# Patient Record
Sex: Female | Born: 1985 | Race: White | Hispanic: No | Marital: Married | Smoking: Never smoker
Health system: Southern US, Community
[De-identification: ages and names within clinical notes are randomized; demographics above are authoritative.]

## PROBLEM LIST (undated history)

## (undated) ENCOUNTER — Inpatient Hospital Stay: Payer: Self-pay

## (undated) DIAGNOSIS — D649 Anemia, unspecified: Secondary | ICD-10-CM

## (undated) DIAGNOSIS — E282 Polycystic ovarian syndrome: Secondary | ICD-10-CM

## (undated) DIAGNOSIS — O24419 Gestational diabetes mellitus in pregnancy, unspecified control: Secondary | ICD-10-CM

## (undated) DIAGNOSIS — J45909 Unspecified asthma, uncomplicated: Secondary | ICD-10-CM

## (undated) DIAGNOSIS — R000 Tachycardia, unspecified: Secondary | ICD-10-CM

## (undated) DIAGNOSIS — T7840XA Allergy, unspecified, initial encounter: Secondary | ICD-10-CM

## (undated) DIAGNOSIS — O1490 Unspecified pre-eclampsia, unspecified trimester: Secondary | ICD-10-CM

## (undated) DIAGNOSIS — K219 Gastro-esophageal reflux disease without esophagitis: Secondary | ICD-10-CM

## (undated) DIAGNOSIS — Z87442 Personal history of urinary calculi: Secondary | ICD-10-CM

## (undated) DIAGNOSIS — H52209 Unspecified astigmatism, unspecified eye: Secondary | ICD-10-CM

## (undated) DIAGNOSIS — G43909 Migraine, unspecified, not intractable, without status migrainosus: Secondary | ICD-10-CM

## (undated) DIAGNOSIS — O365990 Maternal care for other known or suspected poor fetal growth, unspecified trimester, not applicable or unspecified: Secondary | ICD-10-CM

## (undated) HISTORY — PX: WISDOM TOOTH EXTRACTION: SHX21

## (undated) HISTORY — DX: Polycystic ovarian syndrome: E28.2

## (undated) HISTORY — DX: Allergy, unspecified, initial encounter: T78.40XA

## (undated) HISTORY — DX: Unspecified asthma, uncomplicated: J45.909

## (undated) HISTORY — DX: Migraine, unspecified, not intractable, without status migrainosus: G43.909

## (undated) HISTORY — PX: EXTRACORPOREAL SHOCK WAVE LITHOTRIPSY: SHX1557

## (undated) HISTORY — DX: Gestational diabetes mellitus in pregnancy, unspecified control: O24.419

---
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
## 2007-03-19 ENCOUNTER — Ambulatory Visit: Payer: Self-pay | Admitting: Pediatrics

## 2007-04-01 ENCOUNTER — Ambulatory Visit: Payer: Self-pay | Admitting: Pediatrics

## 2009-12-30 ENCOUNTER — Ambulatory Visit: Payer: Self-pay | Admitting: Internal Medicine

## 2010-01-02 ENCOUNTER — Ambulatory Visit: Payer: Self-pay | Admitting: Neurology

## 2010-10-05 LAB — HM PAP SMEAR

## 2010-12-30 ENCOUNTER — Other Ambulatory Visit: Payer: Self-pay | Admitting: Internal Medicine

## 2010-12-30 MED ORDER — DROSPIRENONE-ETHINYL ESTRADIOL 3-0.02 MG PO TABS: 1.0000 | ORAL_TABLET | Freq: Every day | ORAL | Status: DC

## 2010-12-30 MED ORDER — ESOMEPRAZOLE MAGNESIUM 40 MG PO CPDR: 40.0000 mg | DELAYED_RELEASE_CAPSULE | Freq: Every day | ORAL | Status: DC

## 2011-01-05 ENCOUNTER — Telehealth: Payer: Self-pay | Admitting: Internal Medicine

## 2011-01-05 NOTE — Telephone Encounter (Signed)
Bad sore throat for 2 weeks.   1st open i see is 9/10 can we get her in early

## 2011-01-06 ENCOUNTER — Ambulatory Visit (INDEPENDENT_AMBULATORY_CARE_PROVIDER_SITE_OTHER): Payer: BC Managed Care – PPO | Admitting: Internal Medicine

## 2011-01-06 ENCOUNTER — Encounter: Payer: Self-pay | Admitting: Internal Medicine

## 2011-01-06 VITALS — BP 110/74 | HR 95 | Temp 98.0°F | Resp 16 | Ht 62.0 in | Wt 123.5 lb

## 2011-01-06 DIAGNOSIS — J309 Allergic rhinitis, unspecified: Secondary | ICD-10-CM | POA: Insufficient documentation

## 2011-01-06 DIAGNOSIS — G43909 Migraine, unspecified, not intractable, without status migrainosus: Secondary | ICD-10-CM | POA: Insufficient documentation

## 2011-01-06 DIAGNOSIS — J329 Chronic sinusitis, unspecified: Secondary | ICD-10-CM

## 2011-01-06 MED ORDER — AMOXICILLIN-POT CLAVULANATE 875-125 MG PO TABS: 1.0000 | ORAL_TABLET | Freq: Two times a day (BID) | ORAL | Status: AC

## 2011-01-06 NOTE — Progress Notes (Signed)
Subjective:    Patient ID: Jillian Anderson, female    DOB: [birth year], 25 y.o.   MRN: [id number]  24YO female with history of allergies and sinusitis presents for acute visit complaining of sore throat, ear pain, cough, malaise x2 weeks. Sore Throat  This is a new problem. The current episode started 1 to 4 weeks ago. The problem has been gradually worsening. There has been no fever. The pain is moderate. Associated symptoms include congestion, coughing, ear pain and headaches. Pertinent negatives include no abdominal pain, diarrhea, neck pain, shortness of breath, swollen glands, trouble swallowing or vomiting. She has tried acetaminophen and NSAIDs for the symptoms. The treatment provided no relief.  Otalgia  There is pain in both ears. This is a new problem. The current episode started 1 to 4 weeks ago. The problem occurs constantly. The problem has been gradually worsening. There has been no fever. The pain is moderate. Associated symptoms include coughing, headaches, rhinorrhea and a sore throat. Pertinent negatives include no abdominal pain, diarrhea, neck pain, rash or vomiting. She has tried acetaminophen and NSAIDs for the symptoms. The treatment provided no relief.     Outpatient Encounter Prescriptions as of 01/06/2011  Medication Sig Dispense Refill  . drospirenone-ethinyl estradiol (VESTURA) 3-0.02 MG tablet Take 1 tablet by mouth daily.  1 Package  11  . esomeprazole (NEXIUM) 40 MG capsule Take 1 capsule (40 mg total) by mouth daily.  30 capsule  1  . levocetirizine (XYZAL) 5 MG tablet Take 5 mg by mouth daily.        Marland Kitchen lidocaine (LIDODERM) 5 % Place 1 patch onto the skin daily. Remove & Discard patch within 12 hours or as directed by MD       . metoCLOPramide (REGLAN) 10 MG tablet Take 10 mg by mouth every 6 (six) hours as needed.        . Multiple Vitamin (MULTIVITAMIN) capsule Take 1 capsule by mouth daily.        . nortriptyline (PAMELOR) 25 MG capsule Take 25 mg by mouth at  bedtime.        . SUMAtriptan-naproxen (TREXIMET) 85-500 MG per tablet Take 1 tablet by mouth Once PRN.        Marland Kitchen tiZANidine (ZANAFLEX) 4 MG capsule Take 4 mg by mouth every 6 (six) hours as needed.        . topiramate (TOPAMAX) 100 MG tablet Take 100 mg by mouth daily.          Review of Systems  Constitutional: Positive for fever (subjective), chills and fatigue.  HENT: Positive for ear pain, congestion, sore throat, rhinorrhea and sinus pressure. Negative for mouth sores, trouble swallowing, neck pain and voice change.   Eyes: Negative for visual disturbance.  Respiratory: Positive for cough. Negative for chest tightness and shortness of breath.   Cardiovascular: Negative for chest pain.  Gastrointestinal: Negative for nausea, vomiting, abdominal pain and diarrhea.  Musculoskeletal: Positive for myalgias and arthralgias.  Skin: Negative for color change and rash.  Neurological: Positive for headaches.    BP 110/74  Pulse 95  Temp(Src) 98 F (36.7 C) (Oral)  Resp 16  Ht 5\' 2"  (1.575 m)  Wt 123 lb 8 oz (56.019 kg)  BMI 22.59 kg/m2  SpO2 98%  LMP 12/28/2010     Objective:   Physical Exam  Constitutional: She is oriented to person, place, and time. She appears well-developed and well-nourished. No distress.  HENT:  Head: Normocephalic and atraumatic.  Right Ear: External  ear and ear canal normal. Tympanic membrane is bulging. A middle ear effusion is present.  Left Ear: External ear and ear canal normal. Tympanic membrane is bulging. A middle ear effusion is present.  Nose: Right sinus exhibits frontal sinus tenderness. Left sinus exhibits frontal sinus tenderness.  Mouth/Throat: Mucous membranes are normal. Posterior oropharyngeal edema present. No oropharyngeal exudate.  Eyes: Conjunctivae are normal. Pupils are equal, round, and reactive to light. Right eye exhibits no discharge. Left eye exhibits no discharge. No scleral icterus.  Neck: Normal range of motion. Neck supple.  No tracheal deviation present. No thyromegaly present.  Cardiovascular: Normal rate, regular rhythm, normal heart sounds and intact distal pulses.  Exam reveals no gallop and no friction rub.   No murmur heard. Pulmonary/Chest: Effort normal. No accessory muscle usage. Not tachypneic. No respiratory distress. She has no wheezes. She has rales in the left upper field and the left middle field. She exhibits no tenderness.  Musculoskeletal: Normal range of motion. She exhibits no edema and no tenderness.  Lymphadenopathy:    She has no cervical adenopathy.  Neurological: She is alert and oriented to person, place, and time. No cranial nerve deficit. She exhibits normal muscle tone. Coordination normal.  Skin: Skin is warm and dry. No rash noted. She is not diaphoretic. No erythema. No pallor.  Psychiatric: She has a normal mood and affect. Her behavior is normal. Judgment and thought content normal.          Assessment & Plan:  1. Sinusitis - patient presents with nasal congestion, sore throat, bilateral ear pain x2 weeks. Exam is consistent with bilateral frontal sinusitis. Patient notably also has bilateral purulent middle ear effusions. Will treat with Augmentin twice daily for 10 days. She will continue to use ibuprofen and Sudafed. She will stay home from work for the next 3 days. She will return to clinic or call immediately if her symptoms worsen or do not improve.

## 2011-01-06 NOTE — Patient Instructions (Signed)

## 2011-01-06 NOTE — Telephone Encounter (Signed)
Patient has appt for today

## 2011-01-08 ENCOUNTER — Telehealth: Payer: Self-pay | Admitting: *Deleted

## 2011-01-08 ENCOUNTER — Other Ambulatory Visit: Payer: Self-pay | Admitting: Internal Medicine

## 2011-01-08 ENCOUNTER — Ambulatory Visit: Payer: Self-pay | Admitting: Internal Medicine

## 2011-01-08 DIAGNOSIS — R05 Cough: Secondary | ICD-10-CM

## 2011-01-08 NOTE — Telephone Encounter (Signed)
If she is feeling worse, lets order a CXR. It would be great if we can have her get it this afternoon.  I will put order in.

## 2011-01-08 NOTE — Telephone Encounter (Signed)
Patient says that she is not feeling any better since starting the antibiotic and actually thinks that she feels worse. She says that it really hurts when she takes a deep breath. She is asking if you would want her to go for the chest xray at this time. Please advise.

## 2011-01-08 NOTE — Telephone Encounter (Signed)
Patient notified by Elita Quick. Order for chest xray faxed to [facility].

## 2011-01-20 ENCOUNTER — Telehealth: Payer: Self-pay | Admitting: Internal Medicine

## 2011-01-20 NOTE — Telephone Encounter (Signed)
Patient called and stated that she finished her antibiotic Sunday and now she has a yeast infection and wondered if we could call in something for her.  [facility].

## 2011-02-04 ENCOUNTER — Encounter: Payer: Self-pay | Admitting: Internal Medicine

## 2011-02-04 ENCOUNTER — Ambulatory Visit (INDEPENDENT_AMBULATORY_CARE_PROVIDER_SITE_OTHER): Payer: BC Managed Care – PPO | Admitting: Internal Medicine

## 2011-02-04 VITALS — BP 134/88 | HR 90 | Temp 98.4°F | Resp 14 | Ht 65.0 in | Wt 124.0 lb

## 2011-02-04 DIAGNOSIS — R251 Tremor, unspecified: Secondary | ICD-10-CM

## 2011-02-04 DIAGNOSIS — J309 Allergic rhinitis, unspecified: Secondary | ICD-10-CM

## 2011-02-04 DIAGNOSIS — R259 Unspecified abnormal involuntary movements: Secondary | ICD-10-CM

## 2011-02-04 LAB — COMPREHENSIVE METABOLIC PANEL
AST: 17 U/L (ref 0–37)
Albumin: 3.7 g/dL (ref 3.5–5.2)
Alkaline Phosphatase: 58 U/L (ref 39–117)
BUN: 8 mg/dL (ref 6–23)
Creatinine, Ser: 0.9 mg/dL (ref 0.4–1.2)
Glucose, Bld: 89 mg/dL (ref 70–99)
Potassium: 3.6 mEq/L (ref 3.5–5.1)

## 2011-02-04 LAB — TSH: TSH: 2.41 u[IU]/mL (ref 0.35–5.50)

## 2011-02-04 NOTE — Progress Notes (Signed)
Subjective:    Patient ID: Jillian Anderson, female    DOB: 1985-06-20, 25 y.o.   MRN: [id number]  HPI Ms. Judeth Porch is a 25 year old female with a history of migraines and a recent episode of sinusitis who presents for followup. Her primary concern today are recent tremors. She reports that starting approximately 4 days ago she developed some shaking in her hands. She reports this occurred in both hands. She reports that the tremors occurred at rest and stop without any intervention. They have recurred twice since the initial episode. They're described as a fine rapid movement of both of her hands. She also felt dizzy during this episode. She notes recent increased intake of caffeine. Otherwise, she reports that she's been feeling well. She denies any fever or chills. She continues to have some mild ongoing nasal congestion and sore throat.  Outpatient Encounter Prescriptions as of 02/04/2011  Medication Sig Dispense Refill  . drospirenone-ethinyl estradiol (VESTURA) 3-0.02 MG tablet Take 1 tablet by mouth daily.  1 Package  11  . esomeprazole (NEXIUM) 40 MG capsule Take 1 capsule (40 mg total) by mouth daily.  30 capsule  1  . levocetirizine (XYZAL) 5 MG tablet Take 5 mg by mouth daily.        Marland Kitchen lidocaine (LIDODERM) 5 % Place 1 patch onto the skin daily. Remove & Discard patch within 12 hours or as directed by MD       . metoCLOPramide (REGLAN) 10 MG tablet Take 10 mg by mouth every 6 (six) hours as needed.        . Multiple Vitamin (MULTIVITAMIN) capsule Take 1 capsule by mouth daily.        . nortriptyline (PAMELOR) 25 MG capsule Take 25 mg by mouth at bedtime.        . SUMAtriptan-naproxen (TREXIMET) 85-500 MG per tablet Take 1 tablet by mouth Once PRN.        Marland Kitchen tiZANidine (ZANAFLEX) 4 MG capsule Take 4 mg by mouth every 6 (six) hours as needed.        . topiramate (TOPAMAX) 100 MG tablet Take 100 mg by mouth daily.          Review of Systems  Constitutional: Negative for fever, chills,  appetite change, fatigue and unexpected weight change.  HENT: Positive for congestion and sore throat. Negative for ear pain, trouble swallowing, neck pain, voice change and sinus pressure.   Eyes: Negative for visual disturbance.  Respiratory: Negative for cough, shortness of breath, wheezing and stridor.   Cardiovascular: Negative for chest pain, palpitations and leg swelling.  Gastrointestinal: Negative for nausea, vomiting, abdominal pain, diarrhea, constipation, blood in stool, abdominal distention and anal bleeding.  Genitourinary: Negative for dysuria and flank pain.  Musculoskeletal: Negative for myalgias, arthralgias and gait problem.  Skin: Negative for color change and rash.  Neurological: Positive for tremors and headaches. Negative for dizziness, seizures, weakness and numbness.  Hematological: Negative for adenopathy. Does not bruise/bleed easily.  Psychiatric/Behavioral: Negative for suicidal ideas, sleep disturbance and dysphoric mood. The patient is not nervous/anxious.    BP 134/88  Pulse 90  Temp(Src) 98.4 F (36.9 C) (Oral)  Resp 14  Ht 5\' 5"  (1.651 m)  Wt 124 lb (56.246 kg)  BMI 20.63 kg/m2  SpO2 100%  LMP 01/30/2011     Objective:   Physical Exam  Constitutional: She is oriented to person, place, and time. She appears well-developed and well-nourished. No distress.  HENT:  Head: Normocephalic and atraumatic.  Right Ear:  External ear and ear canal normal. A middle ear effusion is present.  Left Ear: External ear and ear canal normal. A middle ear effusion is present.  Nose: Nose normal.  Mouth/Throat: Posterior oropharyngeal erythema present. No oropharyngeal exudate.  Eyes: Conjunctivae are normal. Pupils are equal, round, and reactive to light. Right eye exhibits no discharge. Left eye exhibits no discharge. No scleral icterus.  Neck: Normal range of motion. Neck supple. No tracheal deviation present. No thyromegaly present.  Cardiovascular: Normal rate,  regular rhythm, normal heart sounds and intact distal pulses.  Exam reveals no gallop and no friction rub.   No murmur heard. Pulmonary/Chest: Effort normal and breath sounds normal. No respiratory distress. She has no wheezes. She has no rales. She exhibits no tenderness.  Musculoskeletal: Normal range of motion. She exhibits no edema and no tenderness.  Lymphadenopathy:    She has no cervical adenopathy.  Neurological: She is alert and oriented to person, place, and time. She has normal strength. She displays no tremor. No cranial nerve deficit or sensory deficit. She exhibits normal muscle tone. Coordination and gait normal.  Reflex Scores:      Patellar reflexes are 2+ on the right side and 2+ on the left side. Skin: Skin is warm and dry. No rash noted. She is not diaphoretic. No erythema. No pallor.  Psychiatric: She has a normal mood and affect. Her behavior is normal. Judgment and thought content normal.          Assessment & Plan:  1. Tremor - patient with new onset of tremor approximately 4 days ago. Exam is normal today with no tremor present and normal neurological exam. Question of her tremor may have been secondary to either increased anxiety or increased use of caffeine. We also discussed the possibility of thyroid dysfunction. Will check electrolytes and thyroid function with labs today. Patient will keep a record of how often the tremor is occurring. She will followup in one week.  2. Sinusitis - improved after a course of antibiotics. Still has some mild nasal congestion and sore throat which seem most consistent with seasonal allergies. We'll continue to monitor for now. She will continue her nonsedating antihistamine.

## 2011-02-04 NOTE — Patient Instructions (Signed)
Keep record of episodes of tremor. Follow up in 1 week.

## 2011-02-08 ENCOUNTER — Telehealth: Payer: Self-pay | Admitting: Internal Medicine

## 2011-02-08 NOTE — Telephone Encounter (Signed)
Patient called this morning and stated she had blood drawn last week and where it was drawn she has a black/brown bruise about the size of a 50 cent piece.  It's sore to the touch her mom stated it looked like a hematoma.  Please advise.

## 2011-02-08 NOTE — Telephone Encounter (Signed)
Patient notified. She says that she will come by this afternoon to have it looked at.

## 2011-02-08 NOTE — Telephone Encounter (Signed)
She can stop by and we can take a look at it.

## 2011-02-10 ENCOUNTER — Encounter: Payer: Self-pay | Admitting: Internal Medicine

## 2011-02-11 ENCOUNTER — Ambulatory Visit (INDEPENDENT_AMBULATORY_CARE_PROVIDER_SITE_OTHER): Payer: BC Managed Care – PPO | Admitting: Internal Medicine

## 2011-02-11 ENCOUNTER — Encounter: Payer: Self-pay | Admitting: Internal Medicine

## 2011-02-11 VITALS — BP 120/60 | HR 95 | Temp 98.4°F | Resp 16 | Ht 65.0 in | Wt 128.5 lb

## 2011-02-11 DIAGNOSIS — R251 Tremor, unspecified: Secondary | ICD-10-CM

## 2011-02-11 DIAGNOSIS — B379 Candidiasis, unspecified: Secondary | ICD-10-CM

## 2011-02-11 DIAGNOSIS — R259 Unspecified abnormal involuntary movements: Secondary | ICD-10-CM

## 2011-02-11 MED ORDER — FLUCONAZOLE 150 MG PO TABS: 150.0000 mg | ORAL_TABLET | Freq: Once | ORAL | Status: AC

## 2011-02-11 MED ORDER — NYSTATIN 100000 UNIT/GM EX POWD: 1.0000 g | Freq: Two times a day (BID) | CUTANEOUS | Status: DC

## 2011-02-11 NOTE — Progress Notes (Signed)
Subjective:    Patient ID: Jillian Anderson, female    DOB: 21-Nov-1985, 25 y.o.   MRN: [id number]  HPI [name] is a 25 year old female who presents to followup after recent episode of tremors. After her last visit she underwent labs including TSH and CBC which were normal. CMP was also normal. She notes that she continues to have episodes of lightheadedness and shaking in both of her hands which occur almost daily. Her most recent episode occurred yesterday after eating a meal at a local restaurant she went to the restroom and became lightheaded and then had shaking in her right arm. She denies any nausea, abdominal pain, diarrhea, chest pain, shortness of breath, altered mental status or syncope. She denies any increase in anxiety. The episodes resolve without any intervention.   Patient also reports recurrent vaginal burning and itching after her recent menstrual cycle. She denies any new sexual contacts. She reports this is consistent with her previous history of yeast infection.    Outpatient Encounter Prescriptions as of 02/11/2011  Medication Sig Dispense Refill  . drospirenone-ethinyl estradiol (VESTURA) 3-0.02 MG tablet Take 1 tablet by mouth daily.  1 Package  11  . esomeprazole (NEXIUM) 40 MG capsule Take 1 capsule (40 mg total) by mouth daily.  30 capsule  1  . levocetirizine (XYZAL) 5 MG tablet Take 5 mg by mouth daily.        Marland Kitchen lidocaine (LIDODERM) 5 % Place 1 patch onto the skin daily. Remove & Discard patch within 12 hours or as directed by MD       . metoCLOPramide (REGLAN) 10 MG tablet Take 10 mg by mouth every 6 (six) hours as needed.        . Multiple Vitamin (MULTIVITAMIN) capsule Take 1 capsule by mouth daily.        . nortriptyline (PAMELOR) 25 MG capsule Take 25 mg by mouth at bedtime.        . SUMAtriptan-naproxen (TREXIMET) 85-500 MG per tablet Take 1 tablet by mouth Once PRN.        Marland Kitchen tiZANidine (ZANAFLEX) 4 MG capsule Take 4 mg by mouth every 6 (six) hours as  needed.        . topiramate (TOPAMAX) 100 MG tablet Take 100 mg by mouth daily.        . fluconazole (DIFLUCAN) 150 MG tablet Take 1 tablet (150 mg total) by mouth once.  2 tablet  0  . nystatin (NYSTOP) 100000 UNIT/GM POWD Apply 1 g (100,000 Units total) topically 2 (two) times daily.  60 g  2    Review of Systems  Constitutional: Negative for fever, chills, diaphoresis, appetite change, fatigue and unexpected weight change.  HENT: Positive for rhinorrhea, sneezing and postnasal drip. Negative for ear pain, congestion, sore throat, trouble swallowing, neck pain, voice change and sinus pressure.   Eyes: Negative for visual disturbance.  Respiratory: Negative for cough, shortness of breath, wheezing and stridor.   Cardiovascular: Negative for chest pain, palpitations and leg swelling.  Gastrointestinal: Negative for nausea, vomiting, abdominal pain, diarrhea, constipation, blood in stool, abdominal distention and anal bleeding.  Genitourinary: Positive for vaginal pain. Negative for dysuria and flank pain.  Musculoskeletal: Negative for myalgias, arthralgias and gait problem.  Skin: Negative for color change and rash.  Neurological: Positive for dizziness, tremors, light-headedness and headaches. Negative for weakness and numbness.  Hematological: Negative for adenopathy. Does not bruise/bleed easily.  Psychiatric/Behavioral: Negative for suicidal ideas, sleep disturbance and dysphoric mood. The patient is  not nervous/anxious.    BP 120/60  Pulse 95  Temp(Src) 98.4 F (36.9 C) (Oral)  Resp 16  Ht 5\' 5"  (1.651 m)  Wt 128 lb 8 oz (58.287 kg)  BMI 21.38 kg/m2  SpO2 100%  LMP 01/30/2011     Objective:   Physical Exam  Constitutional: She is oriented to person, place, and time. She appears well-developed and well-nourished. No distress.  HENT:  Head: Normocephalic and atraumatic.  Right Ear: External ear normal.  Left Ear: External ear normal.  Nose: Nose normal.  Mouth/Throat:  Oropharynx is clear and moist. No oropharyngeal exudate.  Eyes: Conjunctivae are normal. Pupils are equal, round, and reactive to light. Right eye exhibits no discharge. Left eye exhibits no discharge. No scleral icterus.  Neck: Normal range of motion. Neck supple. No tracheal deviation present. No thyromegaly present.  Cardiovascular: Normal rate, regular rhythm, normal heart sounds and intact distal pulses.  Exam reveals no gallop and no friction rub.   No murmur heard. Pulmonary/Chest: Effort normal and breath sounds normal. No respiratory distress. She has no wheezes. She has no rales. She exhibits no tenderness.  Musculoskeletal: Normal range of motion. She exhibits no edema and no tenderness.  Lymphadenopathy:    She has no cervical adenopathy.  Neurological: She is alert and oriented to person, place, and time. She has normal strength. She displays no atrophy and no tremor. No cranial nerve deficit or sensory deficit. She exhibits normal muscle tone. Coordination and gait normal.  Reflex Scores:      Patellar reflexes are 2+ on the right side and 2+ on the left side. Skin: Skin is warm and dry. No rash noted. She is not diaphoretic. No erythema. No pallor.  Psychiatric: She has a normal mood and affect. Her behavior is normal. Judgment and thought content normal.          Assessment & Plan:  1. Tremor - patient reports recent episodes of tremor and lightheadedness. Her exam is normal today. Her lab work including TSH, CMP, CBC were normal. She is on several medications for allergies and migraine headaches but these have been chronic medications with no recent changes. Question if she may be having seizures. She is followed by Dr. Sherryll Burger with neurology. We will set her up with him for additional evaluation possibly to include EEG.  2. Vaginal candidiasis -will treat with Diflucan and topical Nystop. She will return to clinic in one month.

## 2011-02-18 ENCOUNTER — Other Ambulatory Visit: Payer: Self-pay | Admitting: Internal Medicine

## 2011-02-19 MED ORDER — LEVOCETIRIZINE DIHYDROCHLORIDE 5 MG PO TABS: 5.0000 mg | ORAL_TABLET | Freq: Every day | ORAL | Status: DC

## 2011-03-04 ENCOUNTER — Telehealth: Payer: Self-pay | Admitting: Internal Medicine
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
## 2011-03-04 NOTE — Telephone Encounter (Signed)
Did she have CT head done at ED? If yes, we should request this. If headache persists, then pt should be seen tomorrow
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
## 2011-03-08 ENCOUNTER — Telehealth: Payer: Self-pay | Admitting: Internal Medicine

## 2011-03-08 NOTE — Telephone Encounter (Signed)
[phone] Pt called she just seen dr Sherryll Burger @ [facility] today.  Dr Sherryll Burger siad she post concussion syndrome she is not allowed to do anything 2 weeks.  She is also having a ct done the dr office will call her with that appointment.  He his having her take fish oil. And flexareal.

## 2011-03-09 ENCOUNTER — Other Ambulatory Visit: Payer: Self-pay | Admitting: *Deleted

## 2011-03-09 MED ORDER — ESOMEPRAZOLE MAGNESIUM 40 MG PO CPDR: 40.0000 mg | DELAYED_RELEASE_CAPSULE | Freq: Every day | ORAL | Status: DC

## 2011-03-16 ENCOUNTER — Ambulatory Visit: Payer: Self-pay | Admitting: Neurology

## 2011-03-18 ENCOUNTER — Encounter: Payer: Self-pay | Admitting: Internal Medicine

## 2011-03-18 ENCOUNTER — Ambulatory Visit (INDEPENDENT_AMBULATORY_CARE_PROVIDER_SITE_OTHER): Payer: BC Managed Care – PPO | Admitting: Internal Medicine

## 2011-03-18 VITALS — BP 118/60 | HR 105 | Temp 98.0°F | Wt 128.0 lb

## 2011-03-18 DIAGNOSIS — F0781 Postconcussional syndrome: Secondary | ICD-10-CM

## 2011-03-18 MED ORDER — TOPIRAMATE 100 MG PO TABS: 100.0000 mg | ORAL_TABLET | Freq: Every day | ORAL | Status: DC
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
## 2011-04-19 ENCOUNTER — Ambulatory Visit (INDEPENDENT_AMBULATORY_CARE_PROVIDER_SITE_OTHER): Payer: BC Managed Care – PPO | Admitting: Internal Medicine

## 2011-04-19 ENCOUNTER — Encounter: Payer: BC Managed Care – PPO | Admitting: *Deleted

## 2011-04-19 ENCOUNTER — Encounter: Payer: Self-pay | Admitting: Internal Medicine

## 2011-04-19 VITALS — BP 120/78 | HR 103 | Temp 98.2°F | Wt 130.0 lb

## 2011-04-19 DIAGNOSIS — R000 Tachycardia, unspecified: Secondary | ICD-10-CM

## 2011-04-19 NOTE — Progress Notes (Signed)
Subjective:    Patient ID: Jillian Anderson, female    DOB: 04-18-1986, 25 y.o.   MRN: [id number]  HPI 25 year old female with a recent history of post concussive syndrome presents for followup. Her primary concern today is tachycardia. She notes that she was seen by her neurologist last week was noted to have a heart rate of 196. She denies being aware of elevated heart rate. She denies palpitations, chest pain, shortness of breath, fatigue. She denies taking any new medications. She reports full compliance with her current medicines. She was told by her neurologist to followup with [place] for evaluation. Otherwise, she reports feeling well.  Outpatient Encounter Prescriptions as of 04/19/2011  Medication Sig Dispense Refill  . cyclobenzaprine (FLEXERIL) 10 MG tablet Take 10 mg by mouth 2 (two) times daily as needed.        . drospirenone-ethinyl estradiol (VESTURA) 3-0.02 MG tablet Take 1 tablet by mouth daily.  1 Package  11  . esomeprazole (NEXIUM) 40 MG capsule Take 1 capsule (40 mg total) by mouth daily.  90 capsule  2  . fish oil-omega-3 fatty acids 1000 MG capsule Take 1 g by mouth daily.        Marland Kitchen levocetirizine (XYZAL) 5 MG tablet Take 1 tablet (5 mg total) by mouth daily.  30 tablet  3  . lidocaine (LIDODERM) 5 % Place 1 patch onto the skin daily. Remove & Discard patch within 12 hours or as directed by MD       . metoCLOPramide (REGLAN) 10 MG tablet Take 10 mg by mouth every 6 (six) hours as needed.        . Multiple Vitamin (MULTIVITAMIN) capsule Take 1 capsule by mouth daily.        . nortriptyline (PAMELOR) 25 MG capsule Take 25 mg by mouth at bedtime.        Marland Kitchen nystatin (NYSTOP) 100000 UNIT/GM POWD Apply 1 g (100,000 Units total) topically 2 (two) times daily.  60 g  2  . SUMAtriptan-naproxen (TREXIMET) 85-500 MG per tablet Take 1 tablet by mouth Once PRN.        Marland Kitchen tiZANidine (ZANAFLEX) 4 MG capsule Take 4 mg by mouth every 6 (six) hours as needed.        . topiramate (TOPAMAX) 100  MG tablet Take 1 tablet (100 mg total) by mouth daily.  30 tablet  6     Review of Systems  Constitutional: Negative for fever, chills, appetite change and fatigue.  Eyes: Negative for visual disturbance.  Respiratory: Negative for cough, shortness of breath, wheezing and stridor.   Cardiovascular: Negative for chest pain, palpitations and leg swelling.  Gastrointestinal: Negative for nausea, vomiting, abdominal pain, diarrhea, constipation, blood in stool, abdominal distention and anal bleeding.  Genitourinary: Negative for dysuria and flank pain.  Musculoskeletal: Negative for myalgias, arthralgias and gait problem.  Skin: Negative for color change and rash.  Neurological: Negative for dizziness and headaches.  Hematological: Negative for adenopathy. Does not bruise/bleed easily.  Psychiatric/Behavioral: Negative for suicidal ideas, sleep disturbance and dysphoric mood. The patient is not nervous/anxious.    BP 120/78  Pulse 103  Temp(Src) 98.2 F (36.8 C) (Oral)  Wt 130 lb (58.968 kg)  SpO2 98%     Objective:   Physical Exam  Constitutional: She is oriented to person, place, and time. She appears well-developed and well-nourished. No distress.  HENT:  Head: Normocephalic and atraumatic.  Right Ear: External ear normal.  Left Ear: External ear normal.  Nose:  Nose normal.  Mouth/Throat: Oropharynx is clear and moist. No oropharyngeal exudate.  Eyes: Conjunctivae are normal. Pupils are equal, round, and reactive to light. Right eye exhibits no discharge. Left eye exhibits no discharge. No scleral icterus.  Neck: Normal range of motion. Neck supple. No tracheal deviation present. No thyromegaly present.  Cardiovascular: Regular rhythm, normal heart sounds and intact distal pulses.  Tachycardia present.  Exam reveals no gallop and no friction rub.   No murmur heard. Pulmonary/Chest: Effort normal and breath sounds normal. No respiratory distress. She has no wheezes. She has no  rales. She exhibits no tenderness.  Musculoskeletal: Normal range of motion. She exhibits no edema and no tenderness.  Lymphadenopathy:    She has no cervical adenopathy.  Neurological: She is alert and oriented to person, place, and time. No cranial nerve deficit. She exhibits normal muscle tone. Coordination normal.  Skin: Skin is warm and dry. No rash noted. She is not diaphoretic. No erythema. No pallor.  Psychiatric: She has a normal mood and affect. Her behavior is normal. Judgment and thought content normal.          Assessment & Plan:  1. Tachycardia - exam is normal except for mildly elevated heart rate which is regular. EKG is normal. Suspect that her heart rate elevation is secondary to deconditioning. We will get records on her evaluation at neurologist office to confirm that heart rate was actually elevated to 196. Heart rate elevation greater than 150 could indicate SVT. Will place a Holter monitor. She will followup in 2 weeks. She will call sooner if she develops any symptoms of rapid heart rate, palpitations, chest pain, diaphoresis, or shortness of breath.  2. Post concussive syndrome - Resolved. Reports clearance by neurology. Will request records on this.

## 2011-04-19 NOTE — Progress Notes (Unsigned)
Pt in for holter monitor per Dr. Dan Humphreys. Pt was given instructions, and will place at home. She will be contacted when to take monitor off.

## 2011-04-22 ENCOUNTER — Telehealth: Payer: Self-pay | Admitting: *Deleted

## 2011-04-22 NOTE — Telephone Encounter (Signed)
Pt notified of holter results, showing NSR with periods of sinus tachycardia. HR ranged from 60-168, average 100bpm. Pt not interested in trying beta blocker at this time. Holter portion complete, pt will continue to wear monitor, now converted to ACT event monitor, for 2 weeks. Will fax holter to pcp, Dr. Ronna Polio.

## 2011-05-10 ENCOUNTER — Ambulatory Visit: Payer: BC Managed Care – PPO | Admitting: Internal Medicine

## 2011-05-19 ENCOUNTER — Ambulatory Visit (INDEPENDENT_AMBULATORY_CARE_PROVIDER_SITE_OTHER): Payer: BC Managed Care – PPO | Admitting: Internal Medicine

## 2011-05-19 ENCOUNTER — Encounter: Payer: Self-pay | Admitting: Internal Medicine

## 2011-05-19 ENCOUNTER — Other Ambulatory Visit: Payer: Self-pay | Admitting: *Deleted

## 2011-05-19 VITALS — BP 128/72 | HR 96 | Temp 98.2°F | Wt 130.0 lb

## 2011-05-19 DIAGNOSIS — R252 Cramp and spasm: Secondary | ICD-10-CM

## 2011-05-19 DIAGNOSIS — J45909 Unspecified asthma, uncomplicated: Secondary | ICD-10-CM

## 2011-05-19 DIAGNOSIS — R002 Palpitations: Secondary | ICD-10-CM

## 2011-05-19 LAB — COMPREHENSIVE METABOLIC PANEL
AST: 23 U/L (ref 0–37)
Albumin: 3.4 g/dL — ABNORMAL LOW (ref 3.5–5.2)
BUN: 16 mg/dL (ref 6–23)
CO2: 24 mEq/L (ref 19–32)
Calcium: 9.1 mg/dL (ref 8.4–10.5)
Chloride: 106 mEq/L (ref 96–112)
Creatinine, Ser: 0.7 mg/dL (ref 0.4–1.2)
GFR: 119.99 mL/min (ref >60.00)
Glucose, Bld: 91 mg/dL (ref 70–99)
Potassium: 3.4 mEq/L — ABNORMAL LOW (ref 3.5–5.1)

## 2011-05-19 MED ORDER — FLUTICASONE-SALMETEROL 250-50 MCG/DOSE IN AEPB: 1.0000 | INHALATION_SPRAY | Freq: Two times a day (BID) | RESPIRATORY_TRACT | Status: DC

## 2011-05-19 MED ORDER — METOPROLOL SUCCINATE ER 25 MG PO TB24: 25.0000 mg | ORAL_TABLET | Freq: Every day | ORAL | Status: DC

## 2011-05-19 NOTE — Progress Notes (Signed)
Subjective:    Patient ID: Jillian Anderson, female    DOB: Aug 24, 1985, 26 y.o.   MRN: [id number]  HPI 25YO female with h/o migraines, seasonal allergies, asthma and recent episodes of palpitations presents for follow up. She recently had Holter monitor which showed episodes of tachycardia up to 168.  She was encouraged to consider betablocker, but decided to hold off. She continues to have episodes of tachycardia and palpitations both at rest and on exertion.  She denies chest pain, headache, diaphoresis, or other symptoms.  She is also concerned today about several days of increasing cramping in her arms, hands, and legs. She has had these symptoms in the past, associated with low potassium levels.  She was evaluated by nephrology but workup was unremarkable. She denies any recent nausea, vomiting, diarrhea, or other symptoms. She reports good po intake.   Outpatient Encounter Prescriptions as of 05/19/2011  Medication Sig Dispense Refill  . cyclobenzaprine (FLEXERIL) 10 MG tablet Take 10 mg by mouth 2 (two) times daily as needed.        . drospirenone-ethinyl estradiol (VESTURA) 3-0.02 MG tablet Take 1 tablet by mouth daily.  1 Package  11  . esomeprazole (NEXIUM) 40 MG capsule Take 1 capsule (40 mg total) by mouth daily.  90 capsule  2  . fish oil-omega-3 fatty acids 1000 MG capsule Take 1 g by mouth daily.        Marland Kitchen levocetirizine (XYZAL) 5 MG tablet Take 1 tablet (5 mg total) by mouth daily.  30 tablet  3  . lidocaine (LIDODERM) 5 % Place 1 patch onto the skin daily. Remove & Discard patch within 12 hours or as directed by MD       . metoCLOPramide (REGLAN) 10 MG tablet Take 10 mg by mouth every 6 (six) hours as needed.        . Multiple Vitamin (MULTIVITAMIN) capsule Take 1 capsule by mouth daily.        . nortriptyline (PAMELOR) 25 MG capsule Take 25 mg by mouth at bedtime.        Marland Kitchen nystatin (NYSTOP) 100000 UNIT/GM POWD Apply 1 g (100,000 Units total) topically 2 (two) times daily.  60 g   2  . SUMAtriptan-naproxen (TREXIMET) 85-500 MG per tablet Take 1 tablet by mouth Once PRN.        Marland Kitchen tiZANidine (ZANAFLEX) 4 MG capsule Take 4 mg by mouth every 6 (six) hours as needed.        . topiramate (TOPAMAX) 100 MG tablet Take 1 tablet (100 mg total) by mouth daily.  30 tablet  6  . Fluticasone-Salmeterol (ADVAIR DISKUS) 250-50 MCG/DOSE AEPB Inhale 1 puff into the lungs 2 (two) times daily.  60 each  3  . metoprolol succinate (TOPROL-XL) 25 MG 24 hr tablet Take 1 tablet (25 mg total) by mouth daily.  90 tablet  3    Review of Systems  Constitutional: Negative for fever, chills, appetite change, fatigue and unexpected weight change.  HENT: Negative for ear pain, congestion, sore throat, trouble swallowing, neck pain, voice change and sinus pressure.   Eyes: Negative for visual disturbance.  Respiratory: Negative for cough, shortness of breath, wheezing and stridor.   Cardiovascular: Positive for palpitations. Negative for chest pain and leg swelling.  Gastrointestinal: Negative for nausea, vomiting, abdominal pain, diarrhea, constipation, blood in stool, abdominal distention and anal bleeding.  Genitourinary: Negative for dysuria and flank pain.  Musculoskeletal: Positive for myalgias. Negative for arthralgias and gait problem.  Skin:  Negative for color change and rash.  Neurological: Negative for dizziness and headaches.  Hematological: Negative for adenopathy. Does not bruise/bleed easily.  Psychiatric/Behavioral: Negative for suicidal ideas, sleep disturbance and dysphoric mood. The patient is not nervous/anxious.    BP 128/72  Pulse 96  Temp(Src) 98.2 F (36.8 C) (Oral)  Wt 130 lb (58.968 kg)  SpO2 99%  LMP 04/20/2011     Objective:   Physical Exam  Constitutional: She is oriented to person, place, and time. She appears well-developed and well-nourished. No distress.  HENT:  Head: Normocephalic and atraumatic.  Right Ear: External ear normal.  Left Ear: External ear  normal.  Nose: Nose normal.  Mouth/Throat: Oropharynx is clear and moist. No oropharyngeal exudate.  Eyes: Conjunctivae are normal. Pupils are equal, round, and reactive to light. Right eye exhibits no discharge. Left eye exhibits no discharge. No scleral icterus.  Neck: Normal range of motion. Neck supple. No tracheal deviation present. No thyromegaly present.  Cardiovascular: Regular rhythm, normal heart sounds and intact distal pulses.  Tachycardia present.  Exam reveals no gallop and no friction rub.   No murmur heard. Pulmonary/Chest: Effort normal and breath sounds normal. No respiratory distress. She has no wheezes. She has no rales. She exhibits no tenderness.  Musculoskeletal: Normal range of motion. She exhibits no edema and no tenderness.  Lymphadenopathy:    She has no cervical adenopathy.  Neurological: She is alert and oriented to person, place, and time. No cranial nerve deficit. She exhibits normal muscle tone. Coordination normal.  Skin: Skin is warm and dry. No rash noted. She is not diaphoretic. No erythema. No pallor.  Psychiatric: She has a normal mood and affect. Her behavior is normal. Judgment and thought content normal.          Assessment & Plan:  1. Palpitations - Will start metoprolol 25mg  daily. Pt will call if symptoms of palpitations not improving, or if any new symptoms. Follow up in 1 month.  2. Muscle cramps - Pt has h/o hypokalemia. S/p workup by nephrology which was unremarkable. Suspect hypokalemia may be contributing to cramping. Will check K with labs today. If low, will supplement.  3. Asthma - Pt has used Advair in the past during winter months when asthma flares up.  Refill given today.

## 2011-05-20 ENCOUNTER — Telehealth: Payer: Self-pay | Admitting: *Deleted

## 2011-05-20 MED ORDER — POTASSIUM CHLORIDE CRYS ER 20 MEQ PO TBCR: 20.0000 meq | EXTENDED_RELEASE_TABLET | Freq: Every day | ORAL | Status: DC

## 2011-05-20 NOTE — Telephone Encounter (Signed)
Message copied by Vernie Murders on Thu May 20, 2011  6:12 PM ------      Message from: Ronna Polio A      Created: Wed May 19, 2011  5:11 PM       Labs show that potassium is low. I would recommend taking Potassium daily x 3 days.  We should then repeat BMP next week.

## 2011-05-20 NOTE — Telephone Encounter (Signed)
Left detailed VM on hm # 

## 2011-05-24 ENCOUNTER — Telehealth: Payer: Self-pay | Admitting: Internal Medicine

## 2011-05-24 NOTE — Telephone Encounter (Signed)
Patient wants to know what her potassium level was and when she should have it checked.

## 2011-05-25 ENCOUNTER — Other Ambulatory Visit (INDEPENDENT_AMBULATORY_CARE_PROVIDER_SITE_OTHER): Payer: BC Managed Care – PPO | Admitting: *Deleted

## 2011-05-25 ENCOUNTER — Other Ambulatory Visit: Payer: BC Managed Care – PPO

## 2011-05-25 DIAGNOSIS — E876 Hypokalemia: Secondary | ICD-10-CM

## 2011-05-25 LAB — BASIC METABOLIC PANEL
BUN: 15 mg/dL (ref 6–23)
Chloride: 106 mEq/L (ref 96–112)
Creatinine, Ser: 0.7 mg/dL (ref 0.4–1.2)
GFR: 119.97 mL/min (ref >60.00)
Potassium: 3.4 mEq/L — ABNORMAL LOW (ref 3.5–5.1)

## 2011-05-25 NOTE — Telephone Encounter (Signed)
Patient has been notified

## 2011-05-25 NOTE — Telephone Encounter (Signed)
See result note - I left detailed VM on home #, OK PER HIPPA form. RX was sent into pharm. I sent MyChart message, need to call pt to f/u.   Labs show that potassium is low. I would recommend taking Potassium daily x 3 days. We should then repeat BMP next week.

## 2011-05-26 ENCOUNTER — Telehealth: Payer: Self-pay | Admitting: *Deleted

## 2011-05-26 NOTE — Telephone Encounter (Signed)
Message copied by Vernie Murders on Wed May 26, 2011  8:00 AM ------      Message from: Ronna Polio A      Created: Wed May 26, 2011  7:51 AM       okay

## 2011-05-26 NOTE — Telephone Encounter (Signed)
My chart mess sent, requesting a mess back confirming when she started/stopped potassium.

## 2011-05-28 ENCOUNTER — Encounter: Payer: Self-pay | Admitting: Internal Medicine

## 2011-06-02 ENCOUNTER — Telehealth: Payer: Self-pay | Admitting: *Deleted

## 2011-06-02 NOTE — Telephone Encounter (Signed)
Spoke w/pt - she wanted to know if she should have repeat potassium labs or supplement again. Spoke w/MD. Repeat labs first, left vm for pt to schedule lab apt.

## 2011-06-03 ENCOUNTER — Other Ambulatory Visit (INDEPENDENT_AMBULATORY_CARE_PROVIDER_SITE_OTHER): Payer: BC Managed Care – PPO | Admitting: *Deleted

## 2011-06-03 DIAGNOSIS — E876 Hypokalemia: Secondary | ICD-10-CM

## 2011-06-04 LAB — BASIC METABOLIC PANEL
BUN: 11 mg/dL (ref 6–23)
CO2: 23 mEq/L (ref 19–32)
Glucose, Bld: 93 mg/dL (ref 70–99)
Potassium: 3.5 mEq/L (ref 3.5–5.1)
Sodium: 136 mEq/L (ref 135–145)

## 2011-06-24 ENCOUNTER — Other Ambulatory Visit: Payer: Self-pay | Admitting: *Deleted

## 2011-06-24 MED ORDER — LEVOCETIRIZINE DIHYDROCHLORIDE 5 MG PO TABS: 5.0000 mg | ORAL_TABLET | Freq: Every day | ORAL | Status: DC

## 2011-06-25 ENCOUNTER — Ambulatory Visit: Payer: BC Managed Care – PPO | Admitting: Internal Medicine

## 2011-06-30 ENCOUNTER — Ambulatory Visit: Payer: BC Managed Care – PPO | Admitting: Internal Medicine

## 2011-07-08 ENCOUNTER — Encounter: Payer: Self-pay | Admitting: Internal Medicine

## 2011-07-08 ENCOUNTER — Ambulatory Visit (INDEPENDENT_AMBULATORY_CARE_PROVIDER_SITE_OTHER): Payer: BC Managed Care – PPO | Admitting: Internal Medicine

## 2011-07-08 VITALS — BP 118/72 | HR 108 | Temp 98.1°F | Ht 62.0 in | Wt 136.0 lb

## 2011-07-08 DIAGNOSIS — K589 Irritable bowel syndrome without diarrhea: Secondary | ICD-10-CM | POA: Insufficient documentation

## 2011-07-08 DIAGNOSIS — R002 Palpitations: Secondary | ICD-10-CM | POA: Insufficient documentation

## 2011-07-08 MED ORDER — METOPROLOL SUCCINATE ER 25 MG PO TB24: 50.0000 mg | ORAL_TABLET | Freq: Every day | ORAL | Status: DC

## 2011-07-08 NOTE — Progress Notes (Signed)
Subjective:    Patient ID: Jillian Anderson, female    DOB: 01/02/1986, 26 y.o.   MRN: [id number]  HPI 26 year old female with history of migraines and palpitations presents for followup. She was recently started on metoprolol to help with symptoms of palpitations. She has not been checking her heart rate. She continues to note symptoms of palpitations. She denies any chest pain or shortness of breath. She reports full compliance with metoprolol. She notes that while on the metoprolol it has been difficult for her to lose weight. She notes approximately 6 pound weight gain since her last visit. She also notes some fatigue but attributes this to her change in work schedule.  Aside from this, she reports she is generally feeling well. She does note some increased gassy abdominal pain which occurred a few days ago. This was a single episode. It occurred in the setting of her menstrual cycle. She has been taking Nexium and probiotic to help with GERD symptoms. She denies any nausea, vomiting, diarrhea.  Outpatient Encounter Prescriptions as of 07/08/2011  Medication Sig Dispense Refill  . cyclobenzaprine (FLEXERIL) 10 MG tablet Take 10 mg by mouth 2 (two) times daily as needed.        . drospirenone-ethinyl estradiol (VESTURA) 3-0.02 MG tablet Take 1 tablet by mouth daily.  1 Package  11  . esomeprazole (NEXIUM) 40 MG capsule Take 1 capsule (40 mg total) by mouth daily.  90 capsule  2  . fish oil-omega-3 fatty acids 1000 MG capsule Take 1 g by mouth daily.        . Fluticasone-Salmeterol (ADVAIR DISKUS) 250-50 MCG/DOSE AEPB Inhale 1 puff into the lungs 2 (two) times daily.  60 each  3  . levocetirizine (XYZAL) 5 MG tablet Take 1 tablet (5 mg total) by mouth daily.  90 tablet  1  . lidocaine (LIDODERM) 5 % Place 1 patch onto the skin daily. Remove & Discard patch within 12 hours or as directed by MD       . metoCLOPramide (REGLAN) 10 MG tablet Take 10 mg by mouth every 6 (six) hours as needed.        .  metoprolol succinate (TOPROL-XL) 25 MG 24 hr tablet Take 2 tablets (50 mg total) by mouth daily.  90 tablet  3  . Multiple Vitamin (MULTIVITAMIN) capsule Take 1 capsule by mouth daily.        . nortriptyline (PAMELOR) 25 MG capsule Take 25 mg by mouth at bedtime.        Marland Kitchen nystatin (NYSTOP) 100000 UNIT/GM POWD Apply 1 g (100,000 Units total) topically 2 (two) times daily.  60 g  2  . SUMAtriptan-naproxen (TREXIMET) 85-500 MG per tablet Take 1 tablet by mouth Once PRN.        Marland Kitchen tiZANidine (ZANAFLEX) 4 MG capsule Take 4 mg by mouth every 6 (six) hours as needed.        . topiramate (TOPAMAX) 100 MG tablet Take 1 tablet (100 mg total) by mouth daily.  30 tablet  6  . DISCONTD: metoprolol succinate (TOPROL-XL) 25 MG 24 hr tablet Take 1 tablet (25 mg total) by mouth daily.  90 tablet  3  . DISCONTD: potassium chloride SA (K-DUR,KLOR-CON) 20 MEQ tablet Take 1 tablet (20 mEq total) by mouth daily. X 3 day, repeat labs next week  3 tablet  0    Review of Systems  Constitutional: Negative for fever, chills, appetite change, fatigue and unexpected weight change.  HENT: Negative for ear  pain, congestion, sore throat, trouble swallowing, neck pain, voice change and sinus pressure.   Eyes: Negative for visual disturbance.  Respiratory: Negative for cough, shortness of breath, wheezing and stridor.   Cardiovascular: Positive for palpitations. Negative for chest pain and leg swelling.  Gastrointestinal: Positive for abdominal pain and abdominal distention. Negative for nausea, vomiting, diarrhea, constipation, blood in stool and anal bleeding.  Genitourinary: Negative for dysuria and flank pain.  Musculoskeletal: Negative for myalgias, arthralgias and gait problem.  Skin: Negative for color change and rash.  Neurological: Negative for dizziness and headaches.  Hematological: Negative for adenopathy. Does not bruise/bleed easily.  Psychiatric/Behavioral: Negative for suicidal ideas, sleep disturbance and  dysphoric mood. The patient is not nervous/anxious.    BP 118/72  Pulse 108  Temp(Src) 98.1 F (36.7 C) (Oral)  Ht 5\' 2"  (1.575 m)  Wt 136 lb (61.689 kg)  BMI 24.87 kg/m2  SpO2 99%     Objective:   Physical Exam  Constitutional: She is oriented to person, place, and time. She appears well-developed and well-nourished. No distress.  HENT:  Head: Normocephalic and atraumatic.  Right Ear: External ear normal.  Left Ear: External ear normal.  Nose: Nose normal.  Mouth/Throat: Oropharynx is clear and moist. No oropharyngeal exudate.  Eyes: Conjunctivae are normal. Pupils are equal, round, and reactive to light. Right eye exhibits no discharge. Left eye exhibits no discharge. No scleral icterus.  Neck: Normal range of motion. Neck supple. No tracheal deviation present. No thyromegaly present.  Cardiovascular: Normal rate, regular rhythm, normal heart sounds and intact distal pulses.  Exam reveals no gallop and no friction rub.   No murmur heard. Pulmonary/Chest: Effort normal and breath sounds normal. No respiratory distress. She has no wheezes. She has no rales. She exhibits no tenderness.  Abdominal: Soft. Bowel sounds are normal. She exhibits no distension and no mass. There is no tenderness. There is no rebound and no guarding.  Musculoskeletal: Normal range of motion. She exhibits no edema and no tenderness.  Lymphadenopathy:    She has no cervical adenopathy.  Neurological: She is alert and oriented to person, place, and time. No cranial nerve deficit. She exhibits normal muscle tone. Coordination normal.  Skin: Skin is warm and dry. No rash noted. She is not diaphoretic. No erythema. No pallor.  Psychiatric: She has a normal mood and affect. Her behavior is normal. Judgment and thought content normal.          Assessment & Plan:

## 2011-07-08 NOTE — Assessment & Plan Note (Signed)
Recent slight worsening in symptoms. Suspect this is related to change in jobs. Will increase probiotic to twice daily. We'll continue Nexium. Patient will call if symptoms recur.

## 2011-07-08 NOTE — Assessment & Plan Note (Signed)
No change in palpitations with use of metoprolol 25 mg daily. Will increase dose to 50 mg daily. Patient will call or e-mail if there are any side effects or concerns. Otherwise, she will followup in one month.

## 2011-08-09 ENCOUNTER — Ambulatory Visit: Payer: BC Managed Care – PPO | Admitting: Internal Medicine

## 2011-08-28 ENCOUNTER — Ambulatory Visit: Payer: Self-pay | Admitting: Internal Medicine

## 2011-08-30 ENCOUNTER — Telehealth: Payer: Self-pay | Admitting: Internal Medicine

## 2011-08-30 NOTE — Telephone Encounter (Signed)
Agree with plan 

## 2011-08-30 NOTE — Telephone Encounter (Signed)
Caller: Tempestt/Mother; PCP: Ronna Polio; CB#: [phone]; ; ; Call regarding Flank Pain;  Had onset of back pain at waist moving around to back on 08/27/2011. Seen at [facility] on 08/28/2011 and had xray and labwokr.  Dx with bilateral kidney infection and kidney stone . She was  given Abx injection and prescribed  Septra DS x 14 days  along with  Tramadol  for pain. Is  feeling better , but  still having back pain and Tramadol doesnt help. Rating 6-10. Urine is more dark and having more frequency .  Pt has contacted a Insurance underwriter and has appt for next week but still having pain.  RN reached See in 4 hrs DISP for flank pain  and UTI sx's per Flank Pain protocol . No  appts seen available for today. RN called office and spoke with  Sharon/ Contact office , and adv to go to UC or ED now  for further  assessment and evaluation and pt agreed.

## 2011-08-30 NOTE — Telephone Encounter (Signed)
As discussed, advised triage nurse to have patient report to ED/UC for E&A for possible kidney stone/SLS

## 2011-09-03 ENCOUNTER — Encounter: Payer: Self-pay | Admitting: Internal Medicine

## 2011-09-03 ENCOUNTER — Ambulatory Visit (INDEPENDENT_AMBULATORY_CARE_PROVIDER_SITE_OTHER): Payer: BC Managed Care – PPO | Admitting: Internal Medicine

## 2011-09-03 VITALS — BP 103/71 | HR 96 | Temp 97.8°F | Resp 14 | Wt 132.2 lb

## 2011-09-03 DIAGNOSIS — N390 Urinary tract infection, site not specified: Secondary | ICD-10-CM

## 2011-09-03 DIAGNOSIS — N200 Calculus of kidney: Secondary | ICD-10-CM | POA: Insufficient documentation

## 2011-09-03 HISTORY — DX: Calculus of kidney: N20.0

## 2011-09-03 LAB — POCT URINALYSIS DIPSTICK
Bilirubin, UA: NEGATIVE
Blood, UA: NEGATIVE
Glucose, UA: NEGATIVE
Ketones, UA: NEGATIVE
Spec Grav, UA: 1.025
pH, UA: 7

## 2011-09-03 NOTE — Progress Notes (Signed)
Addended by: Jobie Quaker on: 09/03/2011 04:46 PM   Modules accepted: Orders

## 2011-09-03 NOTE — Assessment & Plan Note (Signed)
Reviewed recent imaging which shows kidney stone and the left measuring 4 mm. Encouraged patient to continue increased fluid intake. Will add Percocet as needed for severe pain. Will set up urology evaluation. Question if she may need lithotripsy.

## 2011-09-03 NOTE — Progress Notes (Signed)
Subjective:    Patient ID: Jillian Anderson, female    DOB: [birth year], 26 y.o.   MRN: [id number]  HPI 26 year old female presents for urgent visit complaining of malaise, left-sided flank pain. She reports that she was seen last Friday at urgent care for left-sided flank pain and general malaise. At that point, her urine was positive for leukocytes so she was started on Bactrim. She also had x-ray performed which showed a 4 mm kidney stone on the left. She was told to increase her fluid intake and use tramadol for pain. Over the last few days her pain has been persistent. She denies any fever or chills. She denies any hematuria. She reports no improvement in pain with the use of tramadol.  Outpatient Encounter Prescriptions as of 09/03/2011  Medication Sig Dispense Refill  . cyclobenzaprine (FLEXERIL) 10 MG tablet Take 10 mg by mouth 2 (two) times daily as needed.        . drospirenone-ethinyl estradiol (VESTURA) 3-0.02 MG tablet Take 1 tablet by mouth daily.  1 Package  11  . esomeprazole (NEXIUM) 40 MG capsule Take 1 capsule (40 mg total) by mouth daily.  90 capsule  2  . fish oil-omega-3 fatty acids 1000 MG capsule Take 1 g by mouth daily.        Marland Kitchen levocetirizine (XYZAL) 5 MG tablet Take 1 tablet (5 mg total) by mouth daily.  90 tablet  1  . lidocaine (LIDODERM) 5 % Place 1 patch onto the skin daily. Remove & Discard patch within 12 hours or as directed by MD       . metoprolol succinate (TOPROL-XL) 25 MG 24 hr tablet Take 2 tablets (50 mg total) by mouth daily.  90 tablet  3  . Multiple Vitamin (MULTIVITAMIN) capsule Take 1 capsule by mouth daily.        . nortriptyline (PAMELOR) 25 MG capsule Take 25 mg by mouth at bedtime.        . Probiotic Product (PROBIOTIC FORMULA) CAPS Take 2 capsules by mouth daily.      . promethazine (PHENERGAN) 25 MG tablet Take 25 mg by mouth every 6 (six) hours as needed.      . sulfamethoxazole-trimethoprim (BACTRIM DS,SEPTRA DS) 800-160 MG per tablet Take 1  tablet by mouth 2 (two) times daily.      . SUMAtriptan-naproxen (TREXIMET) 85-500 MG per tablet Take 1 tablet by mouth Once PRN.        Marland Kitchen tiZANidine (ZANAFLEX) 4 MG capsule Take 4 mg by mouth every 6 (six) hours as needed.        . topiramate (TOPAMAX) 100 MG tablet Take 1 tablet (100 mg total) by mouth daily.  30 tablet  6  . traMADol (ULTRAM) 50 MG tablet Take 50 mg by mouth every 6 (six) hours as needed.      . Fluticasone-Salmeterol (ADVAIR DISKUS) 250-50 MCG/DOSE AEPB Inhale 1 puff into the lungs 2 (two) times daily.  60 each  3  . metoCLOPramide (REGLAN) 10 MG tablet Take 10 mg by mouth every 6 (six) hours as needed.        . nystatin (NYSTOP) 100000 UNIT/GM POWD Apply 1 g (100,000 Units total) topically 2 (two) times daily.  60 g  2   BP 103/71  Pulse 96  Temp(Src) 97.8 F (36.6 C) (Oral)  Resp 14  Wt 132 lb 4 oz (59.988 kg)  SpO2 99%  Review of Systems  Constitutional: Positive for fatigue. Negative for fever and chills.  Gastrointestinal: Positive for abdominal pain. Negative for nausea, vomiting, diarrhea, constipation and rectal pain.  Genitourinary: Positive for flank pain. Negative for dysuria, urgency, frequency, hematuria, decreased urine volume, vaginal bleeding, vaginal discharge, difficulty urinating, vaginal pain and pelvic pain.       Objective:   Physical Exam  Constitutional: She is oriented to person, place, and time. She appears well-developed and well-nourished. No distress.  HENT:  Head: Normocephalic and atraumatic.  Right Ear: External ear normal.  Left Ear: External ear normal.  Nose: Nose normal.  Mouth/Throat: Oropharynx is clear and moist. No oropharyngeal exudate.  Eyes: Conjunctivae are normal. Pupils are equal, round, and reactive to light. Right eye exhibits no discharge. Left eye exhibits no discharge. No scleral icterus.  Neck: Normal range of motion. Neck supple. No tracheal deviation present. No thyromegaly present.  Cardiovascular: Normal  rate, regular rhythm, normal heart sounds and intact distal pulses.  Exam reveals no gallop and no friction rub.   No murmur heard. Pulmonary/Chest: Effort normal and breath sounds normal. No respiratory distress. She has no wheezes. She has no rales. She exhibits no tenderness.  Abdominal: Soft. Bowel sounds are normal. She exhibits no distension. There is tenderness (suprapubic and left flank).  Musculoskeletal: Normal range of motion. She exhibits no edema and no tenderness.  Lymphadenopathy:    She has no cervical adenopathy.  Neurological: She is alert and oriented to person, place, and time. No cranial nerve deficit. She exhibits normal muscle tone. Coordination normal.  Skin: Skin is warm and dry. No rash noted. She is not diaphoretic. No erythema. No pallor.  Psychiatric: She has a normal mood and affect. Her behavior is normal. Judgment and thought content normal.          Assessment & Plan:

## 2011-09-03 NOTE — Assessment & Plan Note (Signed)
Urine is persistently positive for leukocytes and symptoms are unchanged. Will change antibiotics to Cipro. Will recent urine for culture.

## 2011-09-05 LAB — URINE CULTURE
Colony Count: NO GROWTH
Organism ID, Bacteria: NO GROWTH

## 2011-09-06 ENCOUNTER — Telehealth: Payer: Self-pay | Admitting: Internal Medicine

## 2011-09-06 NOTE — Telephone Encounter (Signed)
We will need to see her first for evaluation. She did not mention these last week.

## 2011-09-06 NOTE — Telephone Encounter (Signed)
Caller: Jillian Anderson; PCP: Ronna Polio; CB#: [phone];  LMP 08/16/11 Call regarding dry mouth onset 09/01/11, sm pin sized painful red bumps on her tongue, one on the roof of her mouth onset 09/05/11. This is her second week of an abx for a UTI/kidney stones. Was on Bactrim, changed to Cipro on 09/03/11. Advised see in 72 hrs per Mouth Lesions Protocol. Pt ? if there is an rx that can be prescribed for her sx. She has a FU appt on 09/09/11. [facility]. Pls call.

## 2011-09-06 NOTE — Telephone Encounter (Signed)
Patient notified

## 2011-09-07 ENCOUNTER — Ambulatory Visit: Payer: Self-pay | Admitting: Urology

## 2011-09-07 LAB — PREGNANCY, URINE: Pregnancy Test, Urine: NEGATIVE m[IU]/mL

## 2011-09-09 ENCOUNTER — Ambulatory Visit: Payer: BC Managed Care – PPO | Admitting: Internal Medicine

## 2011-09-09 ENCOUNTER — Ambulatory Visit: Payer: Self-pay | Admitting: Urology

## 2011-10-06 ENCOUNTER — Other Ambulatory Visit: Payer: Self-pay | Admitting: *Deleted

## 2011-10-06 MED ORDER — ESOMEPRAZOLE MAGNESIUM 40 MG PO CPDR: 40.0000 mg | DELAYED_RELEASE_CAPSULE | Freq: Every day | ORAL | Status: DC

## 2011-10-13 ENCOUNTER — Encounter: Payer: Self-pay | Admitting: Internal Medicine

## 2011-10-14 ENCOUNTER — Telehealth: Payer: Self-pay | Admitting: *Deleted

## 2011-10-14 ENCOUNTER — Other Ambulatory Visit: Payer: Self-pay | Admitting: *Deleted

## 2011-10-14 NOTE — Telephone Encounter (Signed)
Called [facility] pharmacy to get the phone number to the mail order company for the PA for her Nexium.  Called [phone] and rep will fax PA form to our office.  Case #[id number].

## 2011-10-15 NOTE — Telephone Encounter (Signed)
PA form given to Dr. Dan Humphreys for completion and signature.

## 2011-10-15 NOTE — Telephone Encounter (Signed)
PA form was faxed to [employer] at [phone].

## 2011-10-18 NOTE — Telephone Encounter (Signed)
Received PA approval for Nexium, approved from 09/24/2011 through 10/14/2012.  Patient and pharmacy notified.

## 2011-12-07 ENCOUNTER — Other Ambulatory Visit: Payer: Self-pay | Admitting: *Deleted

## 2011-12-07 MED ORDER — TOPIRAMATE 100 MG PO TABS: 100.0000 mg | ORAL_TABLET | Freq: Every day | ORAL | Status: DC

## 2011-12-07 MED ORDER — DROSPIRENONE-ETHINYL ESTRADIOL 3-0.02 MG PO TABS: 1.0000 | ORAL_TABLET | Freq: Every day | ORAL | Status: DC

## 2011-12-23 ENCOUNTER — Telehealth: Payer: Self-pay | Admitting: Internal Medicine

## 2011-12-23 ENCOUNTER — Other Ambulatory Visit (INDEPENDENT_AMBULATORY_CARE_PROVIDER_SITE_OTHER): Payer: BC Managed Care – PPO | Admitting: *Deleted

## 2011-12-23 DIAGNOSIS — N390 Urinary tract infection, site not specified: Secondary | ICD-10-CM

## 2011-12-23 LAB — POCT URINALYSIS DIPSTICK
Blood, UA: NEGATIVE
Ketones, UA: NEGATIVE
Protein, UA: NEGATIVE
pH, UA: 6

## 2011-12-23 MED ORDER — CIPROFLOXACIN HCL 500 MG PO TABS: 500.0000 mg | ORAL_TABLET | Freq: Two times a day (BID) | ORAL | Status: DC

## 2011-12-23 NOTE — Telephone Encounter (Signed)
Patient called back and I advised that since we did not have any available appts today she would need to go to Urgent Care.  She stated she wanted to know what Dr. Dan Humphreys would want her to do.  Please advise.

## 2011-12-23 NOTE — Telephone Encounter (Signed)
Caller: [name]/Patient; Patient Name: Jillian Anderson; PCP: Ronna Polio; Best Callback Phone Number: [phone]; Reason for call: She is complaining of dizziness with standing. She states she had a recent a Kidney stone about two months ago.  She had lithotripsy but she never passed it.  The pain went away but returned about two weeks ago 12/09/11.  Constant + dull pressure for two weeks .  Located in bilateral flanks L> R.  Afebrile, +nausea, +frequency, no burning, Last UOP 30 minutes.  She  states dizziness with standing for long periods for last 5 days. LMP two weeks ago. Currently treating with Motrin 400mg  for pain and discomfort. Emergent s/sx ruled out per Flank Pain protocol with exception to "flank pain or low back pain and UTI symtpoms". See Provider in 4 hours. No appt available.  Care instructions provided.  MESSAGE TO OFFICE FOR PATIENT TO BE SEEN IN 4 HOURS.  MESSAGE IN EPIC TO [facility].  PATIENT IS AWARE OFFICE WILL FOLLOW UP.  THE PATIENT REFUSED 91

## 2011-12-23 NOTE — Telephone Encounter (Signed)
Patient came in today and left a urine sample which showed she had a UTI.  Patient was given Cipro to treat UTI by Dr. Dan Humphreys and urine was sent for culture.  Patient was advised of UTI and Cipro sent to [facility].

## 2011-12-23 NOTE — Telephone Encounter (Signed)
Can you make a follow up for her next week?

## 2011-12-24 NOTE — Telephone Encounter (Signed)
Patient scheduled to see Dr. Dan Humphreys 12/30/2011 at 2:15, patient is aware of appt and advised to arrive at 2:00.

## 2011-12-30 ENCOUNTER — Ambulatory Visit: Payer: Self-pay | Admitting: Internal Medicine

## 2011-12-30 ENCOUNTER — Encounter: Payer: Self-pay | Admitting: Internal Medicine

## 2011-12-30 ENCOUNTER — Ambulatory Visit (INDEPENDENT_AMBULATORY_CARE_PROVIDER_SITE_OTHER): Payer: BC Managed Care – PPO | Admitting: Internal Medicine

## 2011-12-30 ENCOUNTER — Telehealth: Payer: Self-pay | Admitting: Internal Medicine

## 2011-12-30 VITALS — BP 110/72 | HR 100 | Temp 98.7°F | Ht 62.0 in | Wt 142.8 lb

## 2011-12-30 DIAGNOSIS — R109 Unspecified abdominal pain: Secondary | ICD-10-CM

## 2011-12-30 LAB — POCT URINALYSIS DIPSTICK
Bilirubin, UA: NEGATIVE
Glucose, UA: NEGATIVE
Nitrite, UA: POSITIVE
Spec Grav, UA: 1.015
Urobilinogen, UA: 0.2

## 2011-12-30 NOTE — Telephone Encounter (Signed)
CT abdomen showed renal stone on the right measuring 2 mm. No hydronephrosis.

## 2011-12-30 NOTE — Progress Notes (Signed)
Call Report: CT scan-44mm stone in right kidney, no hydronephrosis.  Dr. Dan Humphreys advised, she instructed me to call patient and let her know to set up f/u with Dr. Artis Flock.

## 2011-12-30 NOTE — Assessment & Plan Note (Signed)
Patient presents with approximately one-week history of bilateral intermittent flank pain. Urinalysis is remarkable for blood. Patient was sent for stat noncontrast CT of the abdomen and pelvis. CT of the abdomen showed nephrolithiasis on the right. This is consistent with patient's previous history of nephrolithiasis. She is status post lithotripsy in the past. We'll have her set up followup with her urologist as soon as possible. Encouraged increased fluid intake. If pain is persisting, nonsteroidals or hydrocodone may be helpful.

## 2011-12-30 NOTE — Progress Notes (Signed)
Subjective:    Patient ID: Jillian Anderson, female    DOB: August 11, 1985, 26 y.o.   MRN: [id number]  HPI 26 year old female with history of nephrolithiasis presents for acute visit complaining of one-week history of bilateral flank pain. She was treated last week for dysuria with Cipro. Urine culture from that evaluation has been negative. She reports persistent colicky bilateral flank pain. Pain is occasionally severe. She denies any fever or chills. She denies dysuria, frequency, hematuria. She denies nausea or vomiting.  Outpatient Encounter Prescriptions as of 12/30/2011  Medication Sig Dispense Refill  . ciprofloxacin (CIPRO) 500 MG tablet Take 1 tablet (500 mg total) by mouth 2 (two) times daily.  14 tablet  0  . cyclobenzaprine (FLEXERIL) 10 MG tablet Take 10 mg by mouth 2 (two) times daily as needed.        . drospirenone-ethinyl estradiol (GIANVI) 3-0.02 MG tablet Take 1 tablet by mouth daily.  1 Package  11  . esomeprazole (NEXIUM) 40 MG capsule Take 1 capsule (40 mg total) by mouth daily.  90 capsule  3  . fish oil-omega-3 fatty acids 1000 MG capsule Take 1 g by mouth daily.        . Fluticasone-Salmeterol (ADVAIR DISKUS) 250-50 MCG/DOSE AEPB Inhale 1 puff into the lungs 2 (two) times daily.  60 each  3  . levocetirizine (XYZAL) 5 MG tablet Take 1 tablet (5 mg total) by mouth daily.  90 tablet  1  . lidocaine (LIDODERM) 5 % Place 1 patch onto the skin daily. Remove & Discard patch within 12 hours or as directed by MD       . metoCLOPramide (REGLAN) 10 MG tablet Take 10 mg by mouth every 6 (six) hours as needed.        . metoprolol succinate (TOPROL-XL) 25 MG 24 hr tablet Take 2 tablets (50 mg total) by mouth daily.  90 tablet  3  . Multiple Vitamin (MULTIVITAMIN) capsule Take 1 capsule by mouth daily.        . nortriptyline (PAMELOR) 25 MG capsule Take 25 mg by mouth at bedtime.        . Probiotic Product (PROBIOTIC FORMULA) CAPS Take 2 capsules by mouth daily.      . promethazine  (PHENERGAN) 25 MG tablet Take 25 mg by mouth every 6 (six) hours as needed.      . SUMAtriptan-naproxen (TREXIMET) 85-500 MG per tablet Take 1 tablet by mouth Once PRN.        Marland Kitchen tiZANidine (ZANAFLEX) 4 MG capsule Take 4 mg by mouth every 6 (six) hours as needed.        . topiramate (TOPAMAX) 100 MG tablet Take 1 tablet (100 mg total) by mouth daily.  30 tablet  6  . traMADol (ULTRAM) 50 MG tablet Take 50 mg by mouth every 6 (six) hours as needed.       BP 110/72  Pulse 100  Temp 98.7 F (37.1 C) (Oral)  Ht 5\' 2"  (1.575 m)  Wt 142 lb 12 oz (64.751 kg)  BMI 26.11 kg/m2  SpO2 98%  LMP 12/09/2011  Review of Systems  Constitutional: Negative for fever, chills, appetite change, fatigue and unexpected weight change.  HENT: Negative for ear pain, congestion, sore throat, trouble swallowing, neck pain, voice change and sinus pressure.   Eyes: Negative for visual disturbance.  Respiratory: Negative for cough, shortness of breath, wheezing and stridor.   Cardiovascular: Negative for chest pain, palpitations and leg swelling.  Gastrointestinal: Negative for nausea,  vomiting, abdominal pain, diarrhea, constipation, blood in stool, abdominal distention and anal bleeding.  Genitourinary: Positive for flank pain. Negative for dysuria, urgency, hematuria and pelvic pain.  Musculoskeletal: Negative for myalgias, arthralgias and gait problem.  Skin: Negative for color change and rash.  Neurological: Negative for dizziness and headaches.  Hematological: Negative for adenopathy. Does not bruise/bleed easily.  Psychiatric/Behavioral: Negative for suicidal ideas, disturbed wake/sleep cycle and dysphoric mood. The patient is not nervous/anxious.        Objective:   Physical Exam  Constitutional: She is oriented to person, place, and time. She appears well-developed and well-nourished. No distress.  HENT:  Head: Normocephalic and atraumatic.  Right Ear: External ear normal.  Left Ear: External ear  normal.  Nose: Nose normal.  Mouth/Throat: Oropharynx is clear and moist. No oropharyngeal exudate.  Eyes: Conjunctivae are normal. Pupils are equal, round, and reactive to light. Right eye exhibits no discharge. Left eye exhibits no discharge. No scleral icterus.  Neck: Normal range of motion. Neck supple. No tracheal deviation present. No thyromegaly present.  Cardiovascular: Normal rate, regular rhythm, normal heart sounds and intact distal pulses.  Exam reveals no gallop and no friction rub.   No murmur heard. Pulmonary/Chest: Effort normal and breath sounds normal. No respiratory distress. She has no wheezes. She has no rales. She exhibits no tenderness.  Abdominal: There is tenderness (bilateral CVA tenderness).  Musculoskeletal: Normal range of motion. She exhibits no edema and no tenderness.  Lymphadenopathy:    She has no cervical adenopathy.  Neurological: She is alert and oriented to person, place, and time. No cranial nerve deficit. She exhibits normal muscle tone. Coordination normal.  Skin: Skin is warm and dry. No rash noted. She is not diaphoretic. No erythema. No pallor.  Psychiatric: She has a normal mood and affect. Her behavior is normal. Judgment and thought content normal.          Assessment & Plan:

## 2011-12-31 ENCOUNTER — Other Ambulatory Visit: Payer: Self-pay | Admitting: *Deleted

## 2011-12-31 MED ORDER — LEVOCETIRIZINE DIHYDROCHLORIDE 5 MG PO TABS: 5.0000 mg | ORAL_TABLET | Freq: Every day | ORAL | Status: DC

## 2012-01-01 LAB — CULTURE, URINE COMPREHENSIVE
Colony Count: NO GROWTH
Organism ID, Bacteria: NO GROWTH

## 2012-01-11 ENCOUNTER — Encounter: Payer: Self-pay | Admitting: Internal Medicine

## 2012-01-24 ENCOUNTER — Telehealth: Payer: Self-pay | Admitting: Internal Medicine

## 2012-01-24 DIAGNOSIS — R002 Palpitations: Secondary | ICD-10-CM

## 2012-01-24 MED ORDER — METOPROLOL SUCCINATE ER 25 MG PO TB24: 50.0000 mg | ORAL_TABLET | Freq: Every day | ORAL | Status: DC

## 2012-01-24 NOTE — Telephone Encounter (Signed)
Metoprolol er succinate 25 mg tabs. Take 2 tablets by mouth every day

## 2012-01-24 NOTE — Telephone Encounter (Signed)
Rx sent electronically earlier today.

## 2012-01-24 NOTE — Telephone Encounter (Signed)
2nd request Metoprolol ER Succinate 25 mg tabs Take 2 tablets by mouth every day

## 2012-01-27 ENCOUNTER — Ambulatory Visit: Payer: BC Managed Care – PPO | Admitting: Internal Medicine

## 2012-02-09 ENCOUNTER — Ambulatory Visit (INDEPENDENT_AMBULATORY_CARE_PROVIDER_SITE_OTHER): Payer: BC Managed Care – PPO | Admitting: Internal Medicine

## 2012-02-09 ENCOUNTER — Encounter: Payer: Self-pay | Admitting: Internal Medicine

## 2012-02-09 VITALS — BP 102/70 | HR 82 | Temp 98.7°F | Ht 62.0 in | Wt 145.5 lb

## 2012-02-09 DIAGNOSIS — Z23 Encounter for immunization: Secondary | ICD-10-CM

## 2012-02-09 DIAGNOSIS — N200 Calculus of kidney: Secondary | ICD-10-CM

## 2012-02-09 DIAGNOSIS — G43909 Migraine, unspecified, not intractable, without status migrainosus: Secondary | ICD-10-CM

## 2012-02-09 NOTE — Assessment & Plan Note (Signed)
Currently tapering off some of her migraine medications. Has regular scheduled followup with neurology. We'll continue to monitor.

## 2012-02-09 NOTE — Progress Notes (Signed)
Subjective:    Patient ID: Jillian Anderson, female    DOB: 1986/02/23, 26 y.o.   MRN: [id number]  HPI 26 year old female with history of migraine headaches and nephrolithiasis presents for followup. She reports she is doing well. She was seen by her urologist who felt that her 2 mm kidney stones would pass without intervention. She denies any further flank pain, fever, chills. She denies dysuria. In regards to her migraines, she reports that her neurologist is tapering her off medications. She is generally been doing well without migraine headaches with use of Topamax. She has no new concerns today.  Outpatient Encounter Prescriptions as of 02/09/2012  Medication Sig Dispense Refill  . drospirenone-ethinyl estradiol (GIANVI) 3-0.02 MG tablet Take 1 tablet by mouth daily.  1 Package  11  . esomeprazole (NEXIUM) 40 MG capsule Take 1 capsule (40 mg total) by mouth daily.  90 capsule  3  . fish oil-omega-3 fatty acids 1000 MG capsule Take 1 g by mouth daily.        . Fluticasone-Salmeterol (ADVAIR DISKUS) 250-50 MCG/DOSE AEPB Inhale 1 puff into the lungs 2 (two) times daily.  60 each  3  . levocetirizine (XYZAL) 5 MG tablet Take 1 tablet (5 mg total) by mouth daily.  90 tablet  1  . lidocaine (LIDODERM) 5 % Place 1 patch onto the skin daily. Remove & Discard patch within 12 hours or as directed by MD       . metoCLOPramide (REGLAN) 10 MG tablet Take 10 mg by mouth every 6 (six) hours as needed.        . metoprolol succinate (TOPROL-XL) 25 MG 24 hr tablet Take 2 tablets (50 mg total) by mouth daily.  90 tablet  3  . Multiple Vitamin (MULTIVITAMIN) capsule Take 1 capsule by mouth daily.        . Probiotic Product (PROBIOTIC FORMULA) CAPS Take 2 capsules by mouth daily.      . promethazine (PHENERGAN) 25 MG tablet Take 25 mg by mouth every 6 (six) hours as needed.      . SUMAtriptan-naproxen (TREXIMET) 85-500 MG per tablet Take 1 tablet by mouth Once PRN.        Marland Kitchen tiZANidine (ZANAFLEX) 4 MG capsule  Take 4 mg by mouth every 6 (six) hours as needed.        . topiramate (TOPAMAX) 100 MG tablet Take 1 tablet (100 mg total) by mouth daily.  30 tablet  6  . traMADol (ULTRAM) 50 MG tablet Take 50 mg by mouth every 6 (six) hours as needed.      Marland Kitchen DISCONTD: cyclobenzaprine (FLEXERIL) 10 MG tablet Take 10 mg by mouth 2 (two) times daily as needed.        Marland Kitchen DISCONTD: ciprofloxacin (CIPRO) 500 MG tablet Take 1 tablet (500 mg total) by mouth 2 (two) times daily.  14 tablet  0  . DISCONTD: nortriptyline (PAMELOR) 25 MG capsule Take 25 mg by mouth at bedtime.         BP 102/70  Pulse 82  Temp 98.7 F (37.1 C) (Oral)  Ht 5\' 2"  (1.575 m)  Wt 145 lb 8 oz (65.998 kg)  BMI 26.61 kg/m2  SpO2 98%  LMP 02/05/2012  Review of Systems  Constitutional: Negative for fever, chills, appetite change, fatigue and unexpected weight change.  HENT: Negative for ear pain, congestion, sore throat, trouble swallowing, neck pain, voice change and sinus pressure.   Eyes: Negative for visual disturbance.  Respiratory: Negative for cough,  shortness of breath, wheezing and stridor.   Cardiovascular: Negative for chest pain, palpitations and leg swelling.  Gastrointestinal: Negative for nausea, vomiting, abdominal pain, diarrhea, constipation, blood in stool, abdominal distention and anal bleeding.  Genitourinary: Negative for dysuria and flank pain.  Musculoskeletal: Negative for myalgias, arthralgias and gait problem.  Skin: Negative for color change and rash.  Neurological: Negative for dizziness and headaches.  Hematological: Negative for adenopathy. Does not bruise/bleed easily.  Psychiatric/Behavioral: Negative for suicidal ideas, disturbed wake/sleep cycle and dysphoric mood. The patient is not nervous/anxious.        Objective:   Physical Exam  Constitutional: She is oriented to person, place, and time. She appears well-developed and well-nourished. No distress.  HENT:  Head: Normocephalic and atraumatic.    Right Ear: External ear normal.  Left Ear: External ear normal.  Nose: Nose normal.  Mouth/Throat: Oropharynx is clear and moist. No oropharyngeal exudate.  Eyes: Conjunctivae normal are normal. Pupils are equal, round, and reactive to light. Right eye exhibits no discharge. Left eye exhibits no discharge. No scleral icterus.  Neck: Normal range of motion. Neck supple. No tracheal deviation present. No thyromegaly present.  Cardiovascular: Normal rate, regular rhythm, normal heart sounds and intact distal pulses.  Exam reveals no gallop and no friction rub.   No murmur heard. Pulmonary/Chest: Effort normal and breath sounds normal. No respiratory distress. She has no wheezes. She has no rales. She exhibits no tenderness.  Musculoskeletal: Normal range of motion. She exhibits no edema and no tenderness.  Lymphadenopathy:    She has no cervical adenopathy.  Neurological: She is alert and oriented to person, place, and time. No cranial nerve deficit. She exhibits normal muscle tone. Coordination normal.  Skin: Skin is warm and dry. No rash noted. She is not diaphoretic. No erythema. No pallor.  Psychiatric: She has a normal mood and affect. Her behavior is normal. Judgment and thought content normal.          Assessment & Plan:

## 2012-02-09 NOTE — Assessment & Plan Note (Signed)
Pt was seen and evaluated by urologist. No further symptoms of flank pain or dysuria. Will continue to monitor.

## 2012-04-20 ENCOUNTER — Ambulatory Visit (INDEPENDENT_AMBULATORY_CARE_PROVIDER_SITE_OTHER): Payer: BC Managed Care – PPO | Admitting: Internal Medicine

## 2012-04-20 ENCOUNTER — Encounter: Payer: Self-pay | Admitting: Internal Medicine

## 2012-04-20 ENCOUNTER — Ambulatory Visit: Payer: Self-pay | Admitting: Internal Medicine

## 2012-04-20 VITALS — BP 110/78 | HR 75 | Temp 98.2°F | Ht 62.0 in | Wt 145.5 lb

## 2012-04-20 DIAGNOSIS — J40 Bronchitis, not specified as acute or chronic: Secondary | ICD-10-CM

## 2012-04-20 MED ORDER — BENZONATATE 200 MG PO CAPS: 200.0000 mg | ORAL_CAPSULE | Freq: Two times a day (BID) | ORAL | Status: DC | PRN

## 2012-04-20 MED ORDER — AMOXICILLIN-POT CLAVULANATE 875-125 MG PO TABS: 1.0000 | ORAL_TABLET | Freq: Two times a day (BID) | ORAL | Status: DC

## 2012-04-20 NOTE — Assessment & Plan Note (Signed)
Symptoms are most consistent with bronchitis. Will treat with Augmentin and use tessalon for cough. Pt will call if symptoms are persistent or worsening over the next 48hr. If no improvement, would favor getting CXR.

## 2012-04-20 NOTE — Progress Notes (Signed)
Subjective:    Patient ID: Jillian Anderson, female    DOB: [birth year], 26 y.o.   MRN: [id number]  HPI 26 year old female with history of recurrent sinusitis presents for acute visit complaining of one-week history of nasal congestion, sinus drainage, and cough productive of purulent sputum. She reports that symptoms first began 1 week ago with nasal congestion and drainage. She had some chills but no fever. She reports generally feeling exhausted. She denies chest pain or shortness of breath. 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 She has not been taking any medication for her symptoms.  Outpatient Encounter Prescriptions as of 04/20/2012  Medication Sig Dispense Refill  . drospirenone-ethinyl estradiol (GIANVI) 3-0.02 MG tablet Take 1 tablet by mouth daily.  1 Package  11  . esomeprazole (NEXIUM) 40 MG capsule Take 1 capsule (40 mg total) by mouth daily.  90 capsule  3  . Fluticasone-Salmeterol (ADVAIR DISKUS) 250-50 MCG/DOSE AEPB Inhale 1 puff into the lungs 2 (two) times daily.  60 each  3  . levocetirizine (XYZAL) 5 MG tablet Take 1 tablet (5 mg total) by mouth daily.  90 tablet  1  . lidocaine (LIDODERM) 5 % Place 1 patch onto the skin daily. Remove & Discard patch within 12 hours or as directed by MD       . metoCLOPramide (REGLAN) 10 MG tablet Take 10 mg by mouth every 6 (six) hours as needed.        . metoprolol succinate (TOPROL-XL) 25 MG 24 hr tablet Take 2 tablets (50 mg total) by mouth daily.  90 tablet  3  . Multiple Vitamin (MULTIVITAMIN) capsule Take 1 capsule by mouth daily.        . Probiotic Product (PROBIOTIC FORMULA) CAPS Take 2 capsules by mouth daily.      . promethazine (PHENERGAN) 25 MG tablet Take 25 mg by mouth every 6 (six) hours as needed.      . SUMAtriptan-naproxen (TREXIMET) 85-500 MG per tablet Take 1 tablet by mouth Once PRN.        . topiramate (TOPAMAX) 100 MG tablet Take 1 tablet (100 mg total) by mouth daily.  30 tablet  6  . traMADol  (ULTRAM) 50 MG tablet Take 50 mg by mouth every 6 (six) hours as needed.      Marland Kitchen amoxicillin-clavulanate (AUGMENTIN) 875-125 MG per tablet Take 1 tablet by mouth 2 (two) times daily.  20 tablet  0  . benzonatate (TESSALON) 200 MG capsule Take 1 capsule (200 mg total) by mouth 2 (two) times daily as needed for cough.  20 capsule  0  . fish oil-omega-3 fatty acids 1000 MG capsule Take 1 g by mouth daily.        Marland Kitchen tiZANidine (ZANAFLEX) 4 MG capsule Take 4 mg by mouth every 6 (six) hours as needed.         BP 110/78  Pulse 75  Temp 98.2 F (36.8 C) (Oral)  Ht 5\' 2"  (1.575 m)  Wt 145 lb 8 oz (65.998 kg)  BMI 26.61 kg/m2  SpO2 98%  LMP 04/02/2012  Review of Systems  Constitutional: Positive for fatigue. Negative for fever, chills and unexpected weight change.  HENT: Positive for congestion, rhinorrhea and postnasal drip. Negative for hearing loss, ear pain, nosebleeds, sore throat, facial swelling, sneezing, mouth sores, trouble swallowing, neck pain, neck stiffness, voice change, sinus pressure, tinnitus and ear discharge.   Eyes: Negative for pain, discharge, redness and visual disturbance.  Respiratory: Positive for  cough. Negative for chest tightness, shortness of breath, wheezing and stridor.   Cardiovascular: Negative for chest pain, palpitations and leg swelling.  Musculoskeletal: Negative for myalgias and arthralgias.  Skin: Negative for color change and rash.  Neurological: Negative for dizziness, weakness, light-headedness and headaches.  Hematological: Negative for adenopathy.       Objective:   Physical Exam  Constitutional: She is oriented to person, place, and time. She appears well-developed and well-nourished. No distress.  HENT:  Head: Normocephalic and atraumatic.  Right Ear: External ear normal.  Left Ear: External ear normal.  Nose: Nose normal.  Mouth/Throat: Oropharynx is clear and moist. No oropharyngeal exudate.  Eyes: Conjunctivae normal are normal. Pupils are  equal, round, and reactive to light. Right eye exhibits no discharge. Left eye exhibits no discharge. No scleral icterus.  Neck: Normal range of motion. Neck supple. No tracheal deviation present. No thyromegaly present.  Cardiovascular: Normal rate, regular rhythm, normal heart sounds and intact distal pulses.  Exam reveals no gallop and no friction rub.   No murmur heard. Pulmonary/Chest: Effort normal. No accessory muscle usage. Not tachypneic. No respiratory distress. She has no decreased breath sounds. She has no wheezes. She has rhonchi (scattered). She has no rales. She exhibits no tenderness.  Musculoskeletal: Normal range of motion. She exhibits no edema and no tenderness.  Lymphadenopathy:    She has no cervical adenopathy.  Neurological: She is alert and oriented to person, place, and time. No cranial nerve deficit. She exhibits normal muscle tone. Coordination normal.  Skin: Skin is warm and dry. No rash noted. She is not diaphoretic. No erythema. No pallor.  Psychiatric: She has a normal mood and affect. Her behavior is normal. Judgment and thought content normal.          Assessment & Plan:

## 2012-05-16 ENCOUNTER — Telehealth: Payer: Self-pay | Admitting: Internal Medicine

## 2012-05-16 NOTE — Telephone Encounter (Signed)
Spoke with patient and she stated that she has tried and failed Protonix and Omeprazole OTC.  PA form given to Dr. Dan Humphreys to sign.

## 2012-05-16 NOTE — Telephone Encounter (Signed)
esomeprazole (NEXIUM) 40 MG capsule  #90

## 2012-05-16 NOTE — Telephone Encounter (Signed)
PA form for Nexium faxed to [facility] at [phone].

## 2012-05-16 NOTE — Telephone Encounter (Signed)
Left a message on cell phone voicemail for patient to return call.  I need to know what medications she has tried besides Nexium.

## 2012-05-23 NOTE — Telephone Encounter (Signed)
Received approval letter from [facility] for Nexium for 05/03/12 until 05/24/13. Letter is in your in box to be initialed and sent to be scanned into the chart. Pharmacy notified by telephone.

## 2012-07-11 ENCOUNTER — Other Ambulatory Visit: Payer: Self-pay | Admitting: *Deleted

## 2012-07-11 MED ORDER — LEVOCETIRIZINE DIHYDROCHLORIDE 5 MG PO TABS: 5.0000 mg | ORAL_TABLET | Freq: Every day | ORAL | Status: DC

## 2012-07-11 NOTE — Telephone Encounter (Signed)
faxed

## 2012-07-31 ENCOUNTER — Other Ambulatory Visit: Payer: Self-pay | Admitting: *Deleted

## 2012-07-31 DIAGNOSIS — R002 Palpitations: Secondary | ICD-10-CM

## 2012-07-31 MED ORDER — METOPROLOL SUCCINATE ER 25 MG PO TB24: 50.0000 mg | ORAL_TABLET | Freq: Every day | ORAL | Status: DC

## 2012-07-31 NOTE — Telephone Encounter (Signed)
Eprescribed.

## 2012-08-14 ENCOUNTER — Other Ambulatory Visit: Payer: Self-pay | Admitting: *Deleted

## 2012-08-14 MED ORDER — LEVOCETIRIZINE DIHYDROCHLORIDE 5 MG PO TABS: 5.0000 mg | ORAL_TABLET | Freq: Every day | ORAL | Status: DC

## 2012-08-14 NOTE — Telephone Encounter (Signed)
Eprescribed.

## 2012-08-16 ENCOUNTER — Other Ambulatory Visit (HOSPITAL_COMMUNITY)
Admission: RE | Admit: 2012-08-16 | Discharge: 2012-08-16 | Disposition: A | Discharge status: Home / Self Care | Payer: BC Managed Care – PPO | Source: Ambulatory Visit | Attending: Internal Medicine | Admitting: Internal Medicine

## 2012-08-16 ENCOUNTER — Encounter: Payer: Self-pay | Admitting: Internal Medicine

## 2012-08-16 ENCOUNTER — Ambulatory Visit (INDEPENDENT_AMBULATORY_CARE_PROVIDER_SITE_OTHER): Payer: BC Managed Care – PPO | Admitting: Internal Medicine

## 2012-08-16 VITALS — BP 104/70 | HR 76 | Temp 98.7°F | Ht 64.0 in | Wt 155.0 lb

## 2012-08-16 DIAGNOSIS — G43909 Migraine, unspecified, not intractable, without status migrainosus: Secondary | ICD-10-CM

## 2012-08-16 DIAGNOSIS — Z1151 Encounter for screening for human papillomavirus (HPV): Secondary | ICD-10-CM | POA: Insufficient documentation

## 2012-08-16 DIAGNOSIS — N760 Acute vaginitis: Secondary | ICD-10-CM | POA: Insufficient documentation

## 2012-08-16 DIAGNOSIS — Z113 Encounter for screening for infections with a predominantly sexual mode of transmission: Secondary | ICD-10-CM | POA: Insufficient documentation

## 2012-08-16 DIAGNOSIS — Z01419 Encounter for gynecological examination (general) (routine) without abnormal findings: Secondary | ICD-10-CM | POA: Insufficient documentation

## 2012-08-16 DIAGNOSIS — Z0000 Encounter for general adult medical examination without abnormal findings: Secondary | ICD-10-CM | POA: Insufficient documentation

## 2012-08-16 LAB — CBC WITH DIFFERENTIAL/PLATELET
Basophils Absolute: 0 10*3/uL (ref 0.0–0.1)
Eosinophils Relative: 1.3 % (ref 0.0–5.0)
HCT: 36.5 % (ref 36.0–46.0)
Lymphocytes Relative: 37.8 % (ref 12.0–46.0)
Monocytes Relative: 8.4 % (ref 3.0–12.0)
Neutrophils Relative %: 51.9 % (ref 43.0–77.0)
Platelets: 196 10*3/uL (ref 150.0–400.0)
RDW: 12.7 % (ref 11.5–14.6)
WBC: 7.1 10*3/uL (ref 4.5–10.5)

## 2012-08-16 LAB — LIPID PANEL
Cholesterol: 216 mg/dL — ABNORMAL HIGH (ref 0–200)
HDL: 75 mg/dL (ref >39.00)
Total CHOL/HDL Ratio: 3
Triglycerides: 177 mg/dL — ABNORMAL HIGH (ref 0.0–149.0)
VLDL: 35.4 mg/dL (ref 0.0–40.0)

## 2012-08-16 LAB — COMPREHENSIVE METABOLIC PANEL
ALT: 18 U/L (ref 0–35)
AST: 24 U/L (ref 0–37)
Albumin: 3.2 g/dL — ABNORMAL LOW (ref 3.5–5.2)
Alkaline Phosphatase: 60 U/L (ref 39–117)
BUN: 12 mg/dL (ref 6–23)
CO2: 23 mEq/L (ref 19–32)
Calcium: 9 mg/dL (ref 8.4–10.5)
Chloride: 103 mEq/L (ref 96–112)
Creatinine, Ser: 0.6 mg/dL (ref 0.4–1.2)
GFR: 125.37 mL/min (ref >60.00)
Glucose, Bld: 93 mg/dL (ref 70–99)
Potassium: 3.8 mEq/L (ref 3.5–5.1)
Sodium: 136 mEq/L (ref 135–145)
Total Bilirubin: 0.4 mg/dL (ref 0.3–1.2)
Total Protein: 7.3 g/dL (ref 6.0–8.3)

## 2012-08-16 LAB — HM PAP SMEAR: HM PAP: NEGATIVE

## 2012-08-16 NOTE — Assessment & Plan Note (Signed)
Recently stopped Topamax. Doing well. Will continue to monitor.

## 2012-08-16 NOTE — Assessment & Plan Note (Signed)
General medical exam including breast and pelvic exam normal except for friability noted on examination of cervix. Will send PAP, HPV, GC/Chl with labs. Will check CBC, CMP, lipids, TSH. Encouraged continued healthy diet and regular physical activity. Follow up 1 year and prn.

## 2012-08-16 NOTE — Progress Notes (Signed)
Subjective:    Patient ID: Jillian Anderson, female    DOB: 12/24/1985, 27 y.o.   MRN: [id number]  HPI 27 year old female with history of migraines, GERD presents for annual exam. She reports she is generally doing well. She was recently tapered off her Topamax by her neurologist. She reports headaches continue to be well-controlled with intermittent use of Treximet. She has less than one headache per week. Aside from this, she is doing well. She is recently engaged and planning to get married in September 2014. She denies any new concerns today. She is trying to follow a healthy diet and get regular physical activity.  Outpatient Encounter Prescriptions as of 08/16/2012  Medication Sig Dispense Refill  . drospirenone-ethinyl estradiol (GIANVI) 3-0.02 MG tablet Take 1 tablet by mouth daily.  1 Package  11  . esomeprazole (NEXIUM) 40 MG capsule Take 1 capsule (40 mg total) by mouth daily.  90 capsule  3  . levocetirizine (XYZAL) 5 MG tablet Take 1 tablet (5 mg total) by mouth daily.  30 tablet  0  . metoprolol succinate (TOPROL-XL) 25 MG 24 hr tablet Take 2 tablets (50 mg total) by mouth daily.  60 tablet  1  . Multiple Vitamin (MULTIVITAMIN) capsule Take 1 capsule by mouth daily.        . Probiotic Product (PROBIOTIC FORMULA) CAPS Take 2 capsules by mouth daily.      . promethazine (PHENERGAN) 25 MG tablet Take 25 mg by mouth every 6 (six) hours as needed.      . SUMAtriptan-naproxen (TREXIMET) 85-500 MG per tablet Take 1 tablet by mouth Once PRN.        Marland Kitchen tiZANidine (ZANAFLEX) 4 MG capsule Take 4 mg by mouth every 6 (six) hours as needed.        . fish oil-omega-3 fatty acids 1000 MG capsule Take 1 g by mouth daily.         No facility-administered encounter medications on file as of 08/16/2012.    Review of Systems  Constitutional: Negative for fever, chills, appetite change, fatigue and unexpected weight change.  HENT: Negative for ear pain, congestion, sore throat, trouble swallowing,  neck pain, voice change and sinus pressure.   Eyes: Negative for visual disturbance.  Respiratory: Negative for cough, shortness of breath, wheezing and stridor.   Cardiovascular: Negative for chest pain, palpitations and leg swelling.  Gastrointestinal: Negative for nausea, vomiting, abdominal pain, diarrhea, constipation, blood in stool, abdominal distention and anal bleeding.  Genitourinary: Negative for dysuria and flank pain.  Musculoskeletal: Negative for myalgias, arthralgias and gait problem.  Skin: Negative for color change and rash.  Neurological: Negative for dizziness and headaches.  Hematological: Negative for adenopathy. Does not bruise/bleed easily.  Psychiatric/Behavioral: Negative for suicidal ideas, sleep disturbance and dysphoric mood. The patient is not nervous/anxious.        Objective:   Physical Exam  Constitutional: She is oriented to person, place, and time. She appears well-developed and well-nourished. No distress.  HENT:  Head: Normocephalic and atraumatic.  Right Ear: External ear normal.  Left Ear: External ear normal.  Nose: Nose normal.  Mouth/Throat: Oropharynx is clear and moist. No oropharyngeal exudate.  Eyes: Conjunctivae are normal. Pupils are equal, round, and reactive to light. Right eye exhibits no discharge. Left eye exhibits no discharge. No scleral icterus.  Neck: Normal range of motion. Neck supple. No tracheal deviation present. No thyromegaly present.  Cardiovascular: Normal rate, regular rhythm, normal heart sounds and intact distal pulses.  Exam reveals  no gallop and no friction rub.   No murmur heard. Pulmonary/Chest: Effort normal and breath sounds normal. No respiratory distress. She has no wheezes. She has no rales. She exhibits no tenderness.  Abdominal: Soft. Bowel sounds are normal. She exhibits no distension and no mass. There is no tenderness. There is no rebound and no guarding.  Genitourinary: Rectum normal, vagina normal and  uterus normal. No breast swelling, tenderness, discharge or bleeding. Pelvic exam was performed with patient supine. There is no rash, tenderness or lesion on the right labia. There is no rash, tenderness or lesion on the left labia. Uterus is not enlarged and not tender. Cervix exhibits discharge and friability. Cervix exhibits no motion tenderness. Right adnexum displays no mass, no tenderness and no fullness. Left adnexum displays no mass, no tenderness and no fullness. No erythema or tenderness around the vagina. No vaginal discharge found.  Musculoskeletal: Normal range of motion. She exhibits no edema and no tenderness.  Lymphadenopathy:    She has no cervical adenopathy.  Neurological: She is alert and oriented to person, place, and time. No cranial nerve deficit. She exhibits normal muscle tone. Coordination normal.  Skin: Skin is warm and dry. No rash noted. She is not diaphoretic. No erythema. No pallor.  Psychiatric: She has a normal mood and affect. Her behavior is normal. Judgment and thought content normal.          Assessment & Plan:

## 2012-08-21 ENCOUNTER — Encounter: Payer: Self-pay | Admitting: Internal Medicine

## 2012-08-21 ENCOUNTER — Telehealth: Payer: Self-pay | Admitting: *Deleted

## 2012-08-21 MED ORDER — METRONIDAZOLE 500 MG PO TABS: 500.0000 mg | ORAL_TABLET | Freq: Three times a day (TID) | ORAL | Status: DC

## 2012-08-21 MED ORDER — METRONIDAZOLE 500 MG PO TABS: 500.0000 mg | ORAL_TABLET | Freq: Two times a day (BID) | ORAL | Status: DC

## 2012-08-21 NOTE — Telephone Encounter (Signed)
What medication was supposed to have been called in because I do not see anything to called in other than the Diflucan?

## 2012-08-21 NOTE — Telephone Encounter (Signed)
Metronidazole 500mg  bid x7 days. I will send in. Maybe message did not go through?

## 2012-08-22 ENCOUNTER — Other Ambulatory Visit: Payer: Self-pay | Admitting: *Deleted

## 2012-08-22 MED ORDER — ESOMEPRAZOLE MAGNESIUM 40 MG PO CPDR: 40.0000 mg | DELAYED_RELEASE_CAPSULE | Freq: Every day | ORAL | Status: DC

## 2012-08-22 NOTE — Telephone Encounter (Signed)
Eprescribed.

## 2012-09-09 IMAGING — CT CT STONE STUDY
1 of 2 series · 15 of 32 positions shown, 19 images · non-contrast
Comparison: none

REASON FOR EXAM: CR Cell [id number]  [facility] Pain
COMMENTS:

PROCEDURE:     KCT - KCT ABDOMEN/PELVIS WO (STONE)  - December 30, 2011  [DATE]
RESULT:     Comparison: None
TECHNIQUE: Multiple axial images from the lung bases to the symphysis pubis
were obtained without oral and without intravenous contrast.

[Series 2: abd 3mm wo 3.0 i40f 3 · axial · 0.92mm/px · z∈[-675,-273]mm · 15 of 152 slices shown, 19 images]
[im 12/152  soft-tissue]
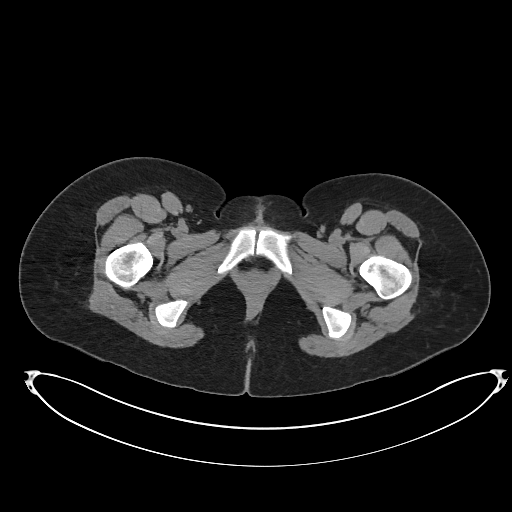
[im 12/152  bone]
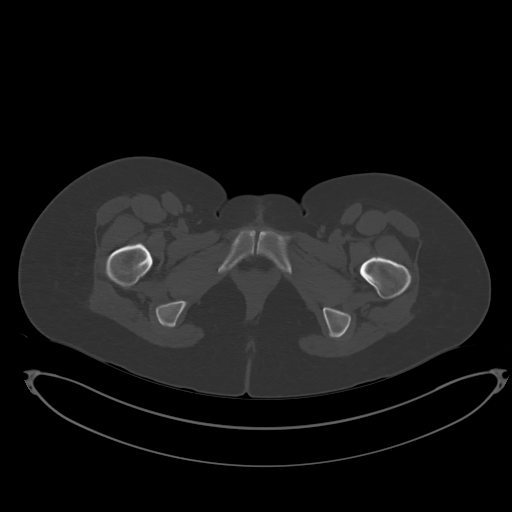
[im 23/152  soft-tissue]
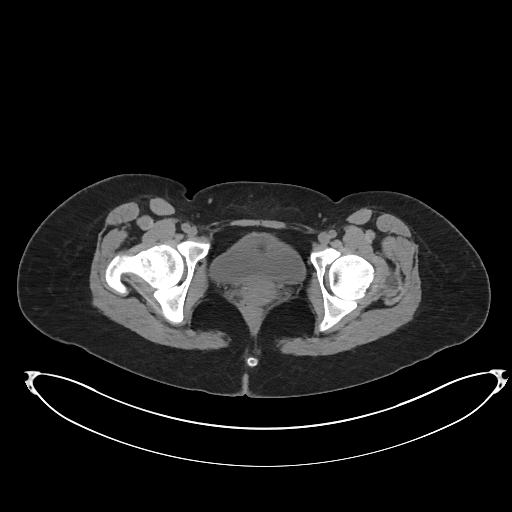
[im 34/152  soft-tissue]
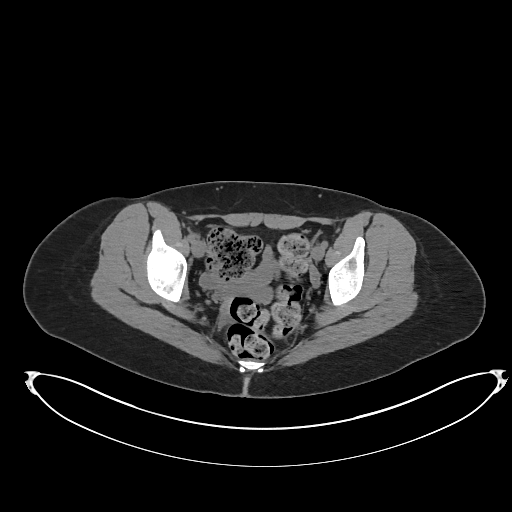
[im 45/152  soft-tissue]
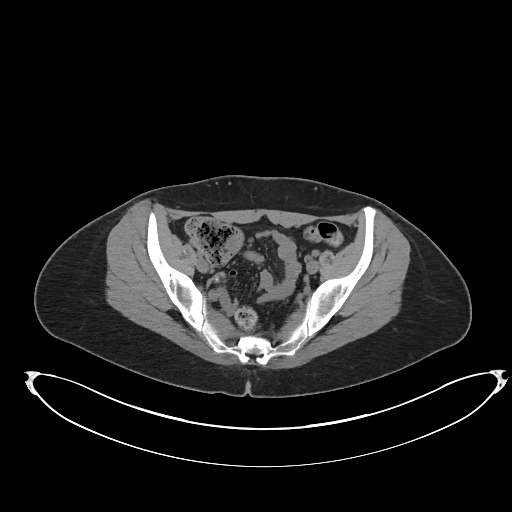
[im 56/152  soft-tissue]
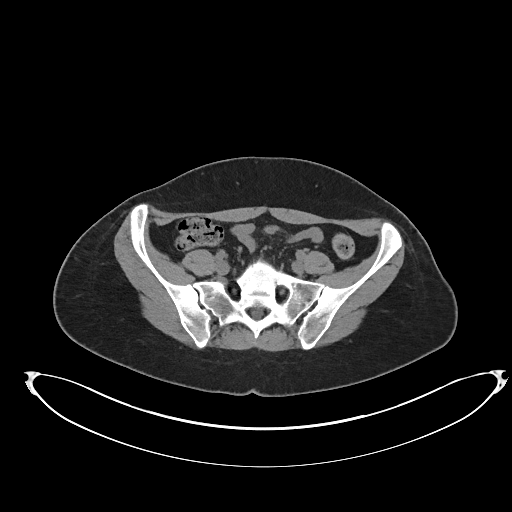
[im 68/152  soft-tissue]
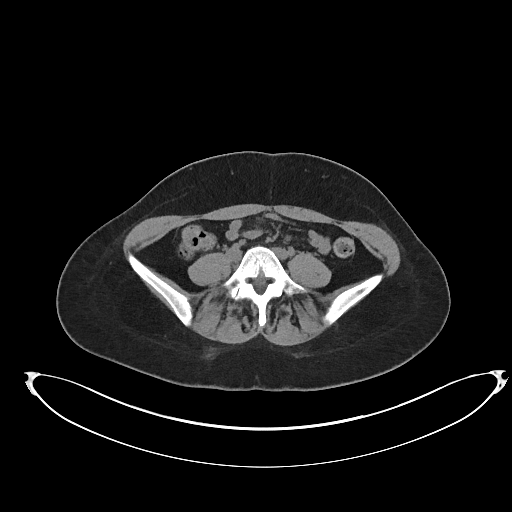
[im 79/152  soft-tissue]
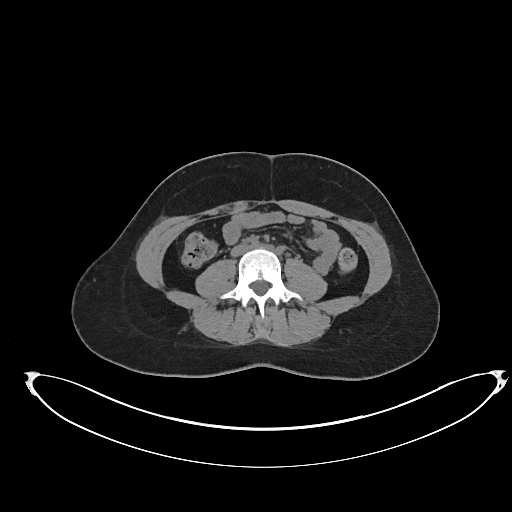
[im 90/152  soft-tissue]
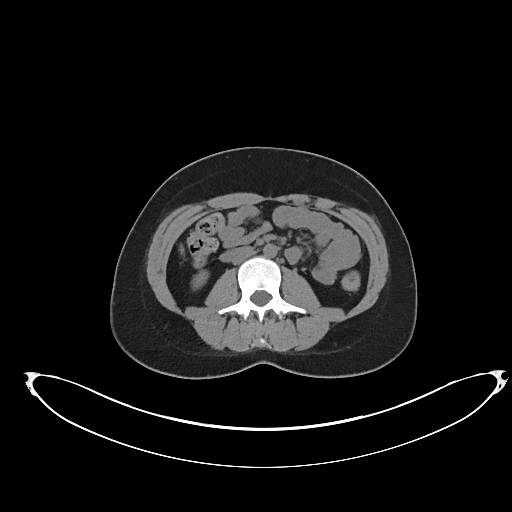
[im 101/152  soft-tissue]
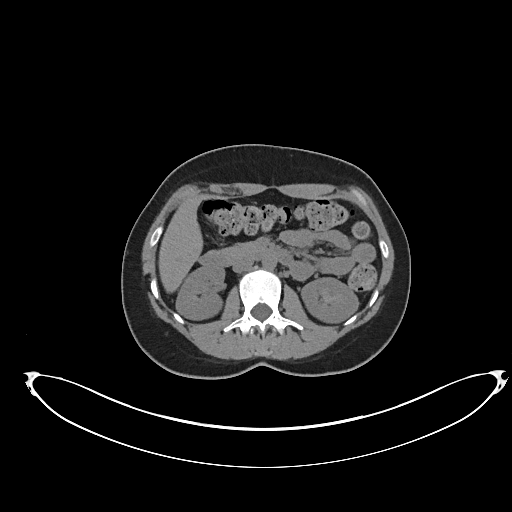
[im 101/152  bone]
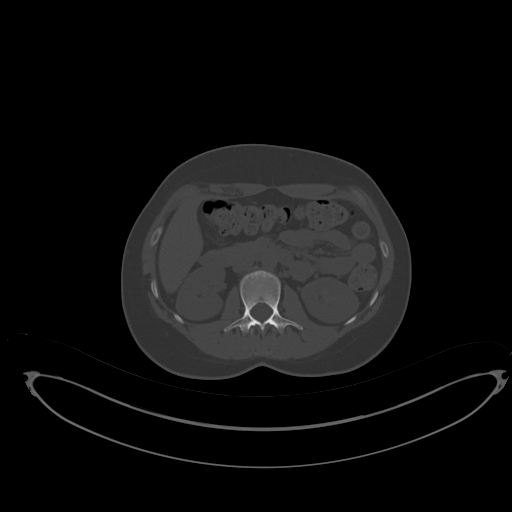
[im 112/152  soft-tissue]
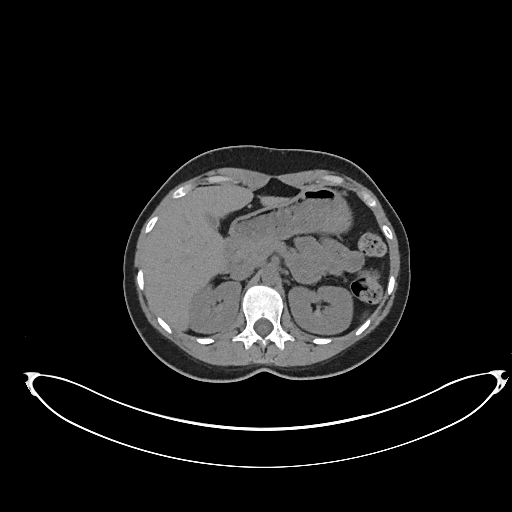
[im 124/152  soft-tissue]
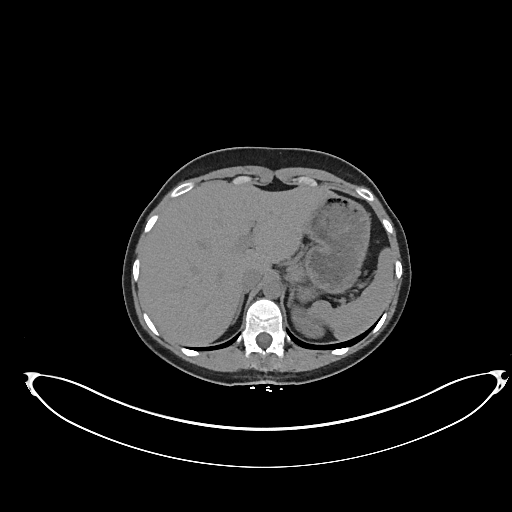
[im 129/152  lung]
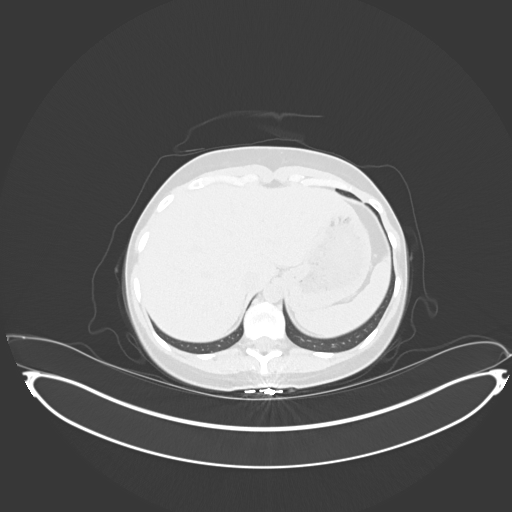
[im 135/152  soft-tissue]
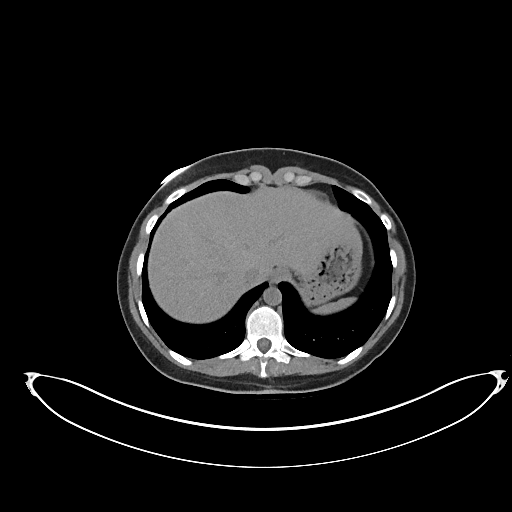
[im 135/152  lung]
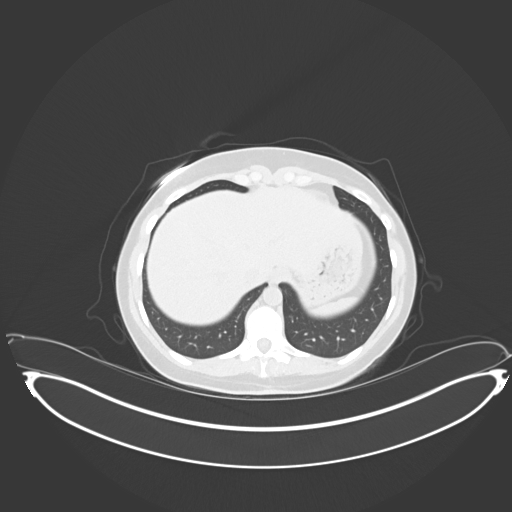
[im 140/152  lung]
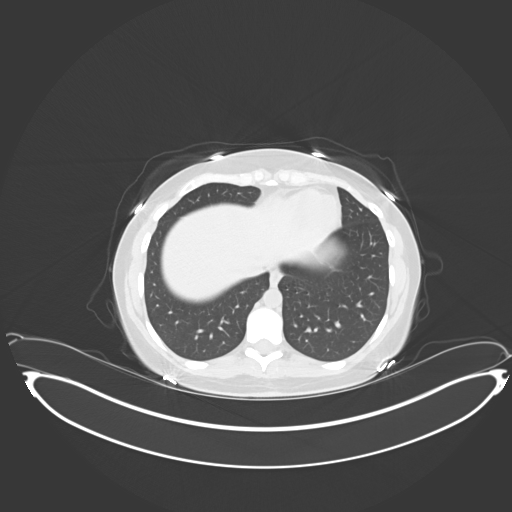
[im 146/152  soft-tissue]
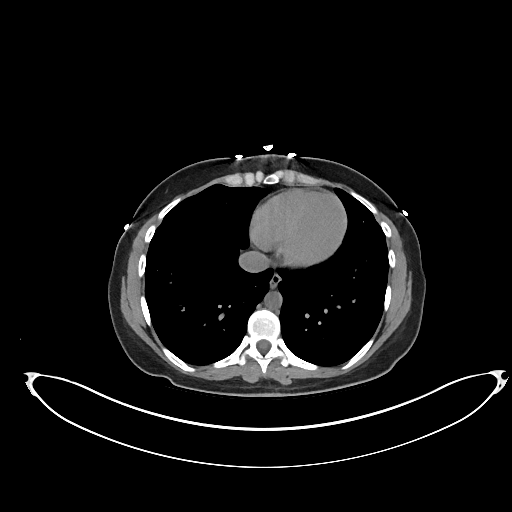
[im 146/152  lung]
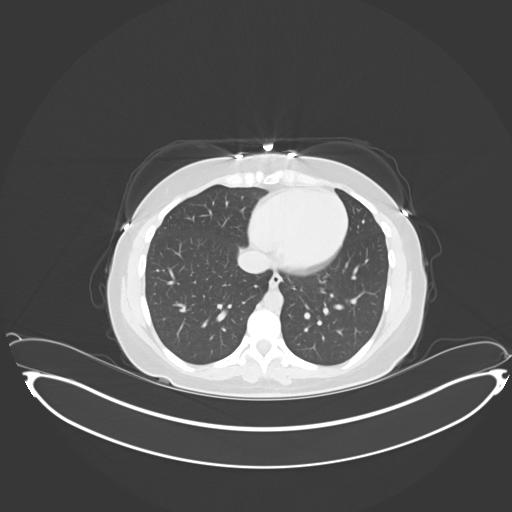

[15 of 32 positions shown; findings below may reference images not displayed]

FINDINGS: Lack of intravenous contrast limits evaluation of the solid abdominal
organs.  Grossly, the liver, gallbladder, spleen, adrenals, and pancreas are
unremarkable.

There is slight increased attenuation of the renal pyramids. Which is
nonspecific and can be seen with dehydration. There is a 2 mm calculus in
the right kidney. There is no hydronephrosis. No ureterectasis.

The small and large bowel are normal in caliber. There is a small amount of
high density material within the tip of the appendix. This may represent
inspissated material. The appendix is normal in caliber and there are no
adjacent inflammatory changes.

No aggressive lytic or sclerotic osseous lesions are identified.
IMPRESSION: There is a 2 mm calculus in the right kidney. No hydronephrosis.

## 2012-09-19 ENCOUNTER — Encounter: Payer: Self-pay | Admitting: Internal Medicine

## 2012-09-22 ENCOUNTER — Encounter: Payer: Self-pay | Admitting: Internal Medicine

## 2012-09-22 ENCOUNTER — Ambulatory Visit (INDEPENDENT_AMBULATORY_CARE_PROVIDER_SITE_OTHER): Payer: BC Managed Care – PPO | Admitting: Internal Medicine

## 2012-09-22 VITALS — BP 100/70 | HR 78 | Temp 99.0°F | Wt 155.0 lb

## 2012-09-22 DIAGNOSIS — N946 Dysmenorrhea, unspecified: Secondary | ICD-10-CM | POA: Insufficient documentation

## 2012-09-22 MED ORDER — MELOXICAM 7.5 MG PO TABS: 7.5000 mg | ORAL_TABLET | Freq: Every day | ORAL | Status: DC

## 2012-09-22 NOTE — Assessment & Plan Note (Signed)
Patient reports extreme pain during her menstrual cycle described as cramping. No improvement with ibuprofen and Zanaflex. Will try meloxicam 15 mg daily started 1-2 days before her menstrual cycle. Will set up GYN evaluation. Question if she might benefit from change in contraceptive method such as change to IUD or alternative OCP.

## 2012-09-22 NOTE — Progress Notes (Signed)
Subjective:    Patient ID: Jillian Anderson, female    DOB: Oct 18, 1985, 27 y.o.   MRN: [id number]  HPI 27 year old female presents for acute visit complaining of severe menstrual cramping. She reports a long history of bad menstrual cramps. She was put on oral contraceptive pills during middle school because of severe menstrual pain. She initially had some improvement. However, with recent change in her migraine medication, she has had significant worsening of menstrual cramps. She has been taking ibuprofen, 800 mg 3 times daily, and Zanaflex with no improvement in symptoms. She denies any heavy menstrual bleeding or clotting. She is interested in potentially changing her oral contraceptive pill to better control symptoms.  Outpatient Encounter Prescriptions as of 09/22/2012  Medication Sig Dispense Refill  . drospirenone-ethinyl estradiol (GIANVI) 3-0.02 MG tablet Take 1 tablet by mouth daily.  1 Package  11  . esomeprazole (NEXIUM) 40 MG capsule Take 1 capsule (40 mg total) by mouth daily.  90 capsule  3  . levocetirizine (XYZAL) 5 MG tablet Take 1 tablet (5 mg total) by mouth daily.  30 tablet  0  . Magnesium 250 MG TABS Take 250 mg by mouth 3 (three) times daily.      . metoprolol succinate (TOPROL-XL) 25 MG 24 hr tablet Take 2 tablets (50 mg total) by mouth daily.  60 tablet  1  . Multiple Vitamin (MULTIVITAMIN) capsule Take 1 capsule by mouth daily.        . Probiotic Product (PROBIOTIC FORMULA) CAPS Take 2 capsules by mouth daily.      . promethazine (PHENERGAN) 25 MG tablet Take 25 mg by mouth every 6 (six) hours as needed.      . SUMAtriptan-naproxen (TREXIMET) 85-500 MG per tablet Take 1 tablet by mouth Once PRN.        Marland Kitchen tiZANidine (ZANAFLEX) 4 MG capsule Take 4 mg by mouth every 6 (six) hours as needed.        . fish oil-omega-3 fatty acids 1000 MG capsule Take 1 g by mouth daily.        . meloxicam (MOBIC) 7.5 MG tablet Take 1-2 tablets (7.5-15 mg total) by mouth daily.  60 tablet   1  . metroNIDAZOLE (FLAGYL) 500 MG tablet Take 1 tablet (500 mg total) by mouth 2 (two) times daily.  14 tablet  0   No facility-administered encounter medications on file as of 09/22/2012.   BP 100/70  Pulse 78  Temp(Src) 99 F (37.2 C) (Oral)  Wt 155 lb (70.308 kg)  BMI 26.59 kg/m2  SpO2 97%  LMP 09/22/2012  Review of Systems  Constitutional: Negative for fever, chills, appetite change, fatigue and unexpected weight change.  HENT: Negative for congestion.   Eyes: Negative for visual disturbance.  Respiratory: Negative for cough, shortness of breath, wheezing and stridor.   Cardiovascular: Negative for chest pain, palpitations and leg swelling.  Gastrointestinal: Positive for abdominal pain. Negative for nausea, vomiting, diarrhea, constipation, blood in stool, abdominal distention and anal bleeding.  Genitourinary: Positive for menstrual problem. Negative for dysuria and flank pain.  Musculoskeletal: Negative for myalgias, arthralgias and gait problem.  Skin: Negative for color change and rash.  Neurological: Negative for dizziness and headaches.  Hematological: Negative for adenopathy. Does not bruise/bleed easily.  Psychiatric/Behavioral: Negative for suicidal ideas, sleep disturbance and dysphoric mood. The patient is not nervous/anxious.        Objective:   Physical Exam  Constitutional: She is oriented to person, place, and time. She appears well-developed  and well-nourished. No distress.  HENT:  Head: Normocephalic and atraumatic.  Right Ear: External ear normal.  Left Ear: External ear normal.  Nose: Nose normal.  Mouth/Throat: Oropharynx is clear and moist. No oropharyngeal exudate.  Eyes: Conjunctivae are normal. Pupils are equal, round, and reactive to light. Right eye exhibits no discharge. Left eye exhibits no discharge. No scleral icterus.  Neck: Normal range of motion. Neck supple. No tracheal deviation present. No thyromegaly present.  Cardiovascular: Normal  rate, regular rhythm, normal heart sounds and intact distal pulses.  Exam reveals no gallop and no friction rub.   No murmur heard. Pulmonary/Chest: Effort normal and breath sounds normal. No accessory muscle usage. Not tachypneic. No respiratory distress. She has no decreased breath sounds. She has no wheezes. She has no rhonchi. She has no rales. She exhibits no tenderness.  Abdominal: Soft. Normal appearance and bowel sounds are normal. There is generalized tenderness (diffuse mild). There is no rigidity, no rebound and no guarding.  Musculoskeletal: Normal range of motion. She exhibits no edema and no tenderness.  Lymphadenopathy:    She has no cervical adenopathy.  Neurological: She is alert and oriented to person, place, and time. No cranial nerve deficit. She exhibits normal muscle tone. Coordination normal.  Skin: Skin is warm and dry. No rash noted. She is not diaphoretic. No erythema. No pallor.  Psychiatric: She has a normal mood and affect. Her behavior is normal. Judgment and thought content normal.          Assessment & Plan:

## 2012-09-26 ENCOUNTER — Other Ambulatory Visit: Payer: Self-pay | Admitting: *Deleted

## 2012-09-26 ENCOUNTER — Ambulatory Visit: Payer: BC Managed Care – PPO | Admitting: Internal Medicine

## 2012-09-26 MED ORDER — LEVOCETIRIZINE DIHYDROCHLORIDE 5 MG PO TABS: 5.0000 mg | ORAL_TABLET | Freq: Every day | ORAL | Status: DC

## 2012-09-26 NOTE — Telephone Encounter (Signed)
Eprescribed.

## 2012-09-27 ENCOUNTER — Other Ambulatory Visit: Payer: Self-pay | Admitting: *Deleted

## 2012-09-27 DIAGNOSIS — R002 Palpitations: Secondary | ICD-10-CM

## 2012-09-27 MED ORDER — METOPROLOL SUCCINATE ER 25 MG PO TB24: 50.0000 mg | ORAL_TABLET | Freq: Every day | ORAL | Status: DC

## 2012-10-30 ENCOUNTER — Other Ambulatory Visit: Payer: Self-pay | Admitting: Internal Medicine

## 2012-12-25 ENCOUNTER — Ambulatory Visit: Payer: BC Managed Care – PPO | Admitting: Internal Medicine

## 2013-01-08 ENCOUNTER — Encounter: Payer: Self-pay | Admitting: Adult Health

## 2013-01-08 ENCOUNTER — Ambulatory Visit (INDEPENDENT_AMBULATORY_CARE_PROVIDER_SITE_OTHER): Payer: BC Managed Care – PPO | Admitting: Adult Health

## 2013-01-08 VITALS — BP 112/62 | HR 78 | Temp 97.9°F | Resp 12 | Ht 62.0 in | Wt 158.5 lb

## 2013-01-08 DIAGNOSIS — J329 Chronic sinusitis, unspecified: Secondary | ICD-10-CM

## 2013-01-08 MED ORDER — AZITHROMYCIN 250 MG PO TABS: ORAL_TABLET | ORAL | Status: DC

## 2013-01-08 NOTE — Patient Instructions (Addendum)
  Please start the Azithromycin today as directed.  Drink plenty of liquids.  You can try Afrin nasal spray for congestion.  Do no use more than 3 days.  You can also try NyQuil at bedtime. This will make you sleepy.  Call if you are not better within 3-4 days.

## 2013-01-08 NOTE — Assessment & Plan Note (Signed)
Start azithromycin. May continue with DayQuil and NyQuil at bedtime. Fluids to maintain hydration. May use Afrin nasal spray for severe congestion for 3 days. RTC if symptoms are not improved within 3-4 days.

## 2013-01-08 NOTE — Progress Notes (Signed)
  Subjective:    Patient ID: Addilyn Satterwhite, female    DOB: 12-Sep-1985, 27 y.o.   MRN: [id number]  HPI  Patient is a pleasant 27 year old female who presents to clinic with complaints of sinus congestion, cough, ear pain and nasal congestion that started on Friday. Patient is getting married this coming Sunday and is concerned about being as sick as she is. She denies fever, chills, wheezing or shortness of breath.  Current Outpatient Prescriptions on File Prior to Visit  Medication Sig Dispense Refill  . esomeprazole (NEXIUM) 40 MG capsule Take 1 capsule (40 mg total) by mouth daily.  90 capsule  3  . levocetirizine (XYZAL) 5 MG tablet TAKE 1 TABLET BY MOUTH EVERY DAY  30 tablet  5  . Magnesium 250 MG TABS Take 250 mg by mouth 3 (three) times daily.      . metoprolol succinate (TOPROL-XL) 25 MG 24 hr tablet Take 2 tablets (50 mg total) by mouth daily.  60 tablet  10  . Multiple Vitamin (MULTIVITAMIN) capsule Take 1 capsule by mouth daily.        . Probiotic Product (PROBIOTIC FORMULA) CAPS Take 2 capsules by mouth daily.      . promethazine (PHENERGAN) 25 MG tablet Take 25 mg by mouth every 6 (six) hours as needed.      . SUMAtriptan-naproxen (TREXIMET) 85-500 MG per tablet Take 1 tablet by mouth Once PRN.        Marland Kitchen tiZANidine (ZANAFLEX) 4 MG capsule Take 4 mg by mouth every 6 (six) hours as needed.        . [DISCONTINUED] potassium chloride SA (K-DUR,KLOR-CON) 20 MEQ tablet Take 1 tablet (20 mEq total) by mouth daily. X 3 day, repeat labs next week  3 tablet  0   No current facility-administered medications on file prior to visit.     Review of Systems  HENT: Positive for ear pain, sore throat and sinus pressure.   Respiratory: Positive for cough.   Neurological: Positive for headaches.   BP 112/62  Pulse 78  Temp(Src) 97.9 F (36.6 C) (Oral)  Resp 12  Ht 5\' 2"  (1.575 m)  Wt 158 lb 8 oz (71.895 kg)  BMI 28.98 kg/m2  SpO2 97%     Objective:   Physical Exam   Constitutional: She appears well-developed and well-nourished. No distress.  HENT:  Head: Normocephalic and atraumatic.  Right Ear: External ear normal.  Mouth/Throat: No oropharyngeal exudate.  Cardiovascular: Regular rhythm.   Pulmonary/Chest: Effort normal and breath sounds normal.      Assessment & Plan:

## 2013-01-09 ENCOUNTER — Telehealth: Payer: Self-pay | Admitting: *Deleted

## 2013-01-09 NOTE — Telephone Encounter (Signed)
Voicemail left from [place] at [facility] in [place], pt went to pick up her Zpack and said she was allergic to that brand due to red dye. [facility] needed a new medication sent in. Spoke with pt, states [facility] spoke with Dr. Dan Humphreys last night and new Rx was sent in for Levaquin. Pt states nothing needed further at this time.

## 2013-03-08 ENCOUNTER — Other Ambulatory Visit: Payer: Self-pay

## 2013-05-20 ENCOUNTER — Other Ambulatory Visit: Payer: Self-pay | Admitting: Internal Medicine

## 2013-07-20 ENCOUNTER — Ambulatory Visit: Payer: BC Managed Care – PPO | Admitting: Internal Medicine

## 2013-07-27 ENCOUNTER — Other Ambulatory Visit: Payer: Self-pay | Admitting: *Deleted

## 2013-07-27 MED ORDER — ESOMEPRAZOLE MAGNESIUM 40 MG PO CPDR: 40.0000 mg | DELAYED_RELEASE_CAPSULE | Freq: Every day | ORAL | Status: DC

## 2013-07-30 ENCOUNTER — Telehealth: Payer: Self-pay | Admitting: *Deleted

## 2013-07-30 ENCOUNTER — Telehealth: Payer: Self-pay | Admitting: Internal Medicine

## 2013-07-30 NOTE — Telephone Encounter (Signed)
[facility]ed [facility] received PA request form for the Nexium placed in Dr.walker box

## 2013-07-30 NOTE — Telephone Encounter (Signed)
The patient called stating she is needing a prior authorization for her  Nexium. Has the pharmacy notified you of this or can you send one to her insurance company per her request?

## 2013-07-31 DIAGNOSIS — Z0279 Encounter for issue of other medical certificate: Secondary | ICD-10-CM

## 2013-07-31 NOTE — Telephone Encounter (Signed)
Pt called to check status of nexium

## 2013-08-01 ENCOUNTER — Encounter: Payer: Self-pay | Admitting: Internal Medicine

## 2013-08-01 DIAGNOSIS — K219 Gastro-esophageal reflux disease without esophagitis: Secondary | ICD-10-CM | POA: Insufficient documentation

## 2013-08-01 NOTE — Telephone Encounter (Signed)
Patient returned call, she stated she has tried Prilosec and that did not work. She tried Protonix and it made her sick.

## 2013-08-01 NOTE — Telephone Encounter (Signed)
Left message to call back  

## 2013-08-22 ENCOUNTER — Encounter: Payer: Self-pay | Admitting: Internal Medicine

## 2013-08-22 ENCOUNTER — Ambulatory Visit (INDEPENDENT_AMBULATORY_CARE_PROVIDER_SITE_OTHER): Payer: BC Managed Care – PPO | Admitting: Internal Medicine

## 2013-08-22 VITALS — BP 118/80 | HR 73 | Temp 98.3°F | Ht 63.75 in | Wt 168.0 lb

## 2013-08-22 DIAGNOSIS — Z23 Encounter for immunization: Secondary | ICD-10-CM | POA: Diagnosis not present

## 2013-08-22 DIAGNOSIS — Z0000 Encounter for general adult medical examination without abnormal findings: Secondary | ICD-10-CM

## 2013-08-22 LAB — CBC WITH DIFFERENTIAL/PLATELET
BASOS ABS: 0.1 10*3/uL (ref 0.0–0.1)
Basophils Relative: 0.7 % (ref 0.0–3.0)
EOS ABS: 0.1 10*3/uL (ref 0.0–0.7)
Eosinophils Relative: 0.7 % (ref 0.0–5.0)
HCT: 39.1 % (ref 36.0–46.0)
Hemoglobin: 12.9 g/dL (ref 12.0–15.0)
LYMPHS PCT: 33.1 % (ref 12.0–46.0)
Lymphs Abs: 2.6 10*3/uL (ref 0.7–4.0)
MCHC: 32.9 g/dL (ref 30.0–36.0)
MCV: 86.8 fl (ref 78.0–100.0)
Monocytes Absolute: 0.5 10*3/uL (ref 0.1–1.0)
Monocytes Relative: 5.9 % (ref 3.0–12.0)
NEUTROS PCT: 59.6 % (ref 43.0–77.0)
Neutro Abs: 4.6 10*3/uL (ref 1.4–7.7)
PLATELETS: 248 10*3/uL (ref 150.0–400.0)
RBC: 4.5 Mil/uL (ref 3.87–5.11)
RDW: 15.7 % — ABNORMAL HIGH (ref 11.5–14.6)
WBC: 7.8 10*3/uL (ref 4.5–10.5)

## 2013-08-22 LAB — TSH: TSH: 1.17 u[IU]/mL (ref 0.35–5.50)

## 2013-08-22 LAB — LIPID PANEL
CHOLESTEROL: 252 mg/dL — AB (ref 0–200)
HDL: 54.9 mg/dL (ref >39.00)
LDL Cholesterol: 172 mg/dL — ABNORMAL HIGH (ref 0–99)
TRIGLYCERIDES: 128 mg/dL (ref 0.0–149.0)
Total CHOL/HDL Ratio: 5
VLDL: 25.6 mg/dL (ref 0.0–40.0)

## 2013-08-22 LAB — COMPREHENSIVE METABOLIC PANEL
ALBUMIN: 3.4 g/dL — AB (ref 3.5–5.2)
ALK PHOS: 58 U/L (ref 39–117)
ALT: 17 U/L (ref 0–35)
AST: 18 U/L (ref 0–37)
BUN: 9 mg/dL (ref 6–23)
CHLORIDE: 104 meq/L (ref 96–112)
CO2: 25 mEq/L (ref 19–32)
Calcium: 9.4 mg/dL (ref 8.4–10.5)
Creatinine, Ser: 0.7 mg/dL (ref 0.4–1.2)
GFR: 115.67 mL/min (ref >60.00)
Glucose, Bld: 71 mg/dL (ref 70–99)
POTASSIUM: 4.1 meq/L (ref 3.5–5.1)
SODIUM: 137 meq/L (ref 135–145)
TOTAL PROTEIN: 7.8 g/dL (ref 6.0–8.3)
Total Bilirubin: 0.6 mg/dL (ref 0.3–1.2)

## 2013-08-22 NOTE — Progress Notes (Signed)
Subjective:    Patient ID: Jillian Anderson, female    DOB: 06-Aug-1985, 28 y.o.   MRN: [id number]  HPI 27YO female presents for annual exam. Doing well. No concerns today. Last PAP 2014 normal, HPV neg. Also reports she had a [facility] exam with [facility]. Trying to follow healthy diet and exercise. New job at [employer].  Review of Systems  Constitutional: Negative for fever, chills, appetite change, fatigue and unexpected weight change.  HENT: Negative for congestion, ear pain, sinus pressure, sore throat, trouble swallowing and voice change.   Eyes: Negative for visual disturbance.  Respiratory: Negative for cough, shortness of breath, wheezing and stridor.   Cardiovascular: Negative for chest pain, palpitations and leg swelling.  Gastrointestinal: Negative for nausea, vomiting, abdominal pain, diarrhea, constipation, blood in stool, abdominal distention and anal bleeding.  Genitourinary: Negative for dysuria and flank pain.  Musculoskeletal: Negative for arthralgias, gait problem, myalgias and neck pain.  Skin: Negative for color change and rash.  Neurological: Negative for dizziness and headaches.  Hematological: Negative for adenopathy. Does not bruise/bleed easily.  Psychiatric/Behavioral: Negative for suicidal ideas, sleep disturbance and dysphoric mood. The patient is not nervous/anxious.        Objective:    BP 118/80  Pulse 73  Temp(Src) 98.3 F (36.8 C) (Oral)  Ht 5' 3.75" (1.619 m)  Wt 168 lb (76.204 kg)  BMI 29.07 kg/m2  SpO2 99%  LMP 06/11/2013 Physical Exam  Constitutional: She is oriented to person, place, and time. She appears well-developed and well-nourished. No distress.  HENT:  Head: Normocephalic and atraumatic.  Right Ear: External ear normal.  Left Ear: External ear normal.  Nose: Nose normal.  Mouth/Throat: Oropharynx is clear and moist. No oropharyngeal exudate.  Eyes: Conjunctivae are normal. Pupils are equal, round, and reactive to  light. Right eye exhibits no discharge. Left eye exhibits no discharge. No scleral icterus.  Neck: Normal range of motion. Neck supple. No tracheal deviation present. No thyromegaly present.  Cardiovascular: Normal rate, regular rhythm, normal heart sounds and intact distal pulses.  Exam reveals no gallop and no friction rub.   No murmur heard. Pulmonary/Chest: Effort normal and breath sounds normal. No accessory muscle usage. Not tachypneic. No respiratory distress. She has no decreased breath sounds. She has no wheezes. She has no rales. She exhibits no tenderness. Right breast exhibits no inverted nipple, no mass, no nipple discharge, no skin change and no tenderness. Left breast exhibits no inverted nipple, no mass, no nipple discharge, no skin change and no tenderness. Breasts are symmetrical.  Abdominal: Soft. Bowel sounds are normal. She exhibits no distension and no mass. There is no tenderness. There is no rebound and no guarding.  Musculoskeletal: Normal range of motion. She exhibits no edema and no tenderness.  Lymphadenopathy:    She has no cervical adenopathy.  Neurological: She is alert and oriented to person, place, and time. No cranial nerve deficit. She exhibits normal muscle tone. Coordination normal.  Skin: Skin is warm and dry. No rash noted. She is not diaphoretic. No erythema. No pallor.  Psychiatric: She has a normal mood and affect. Her behavior is normal. Judgment and thought content normal.          Assessment & Plan:   Problem List Items Addressed This Visit   Routine general medical examination at a health care facility - Primary     General medical exam normal today including breast exam. PAP and pelvic deferred as PAP normal 2014, HPV  neg and [facility] G[facility]. Will request records on this. Immunizations UTD except for Tdap which was given today. Will check labs including CBC, CMP, lipids. Follow up 1 year and prn.    Relevant  Orders      CBC with Differential      Comprehensive metabolic panel      Lipid panel      Vit D  25 hydroxy (rtn osteoporosis monitoring)      TSH       Return in about 1 year (around 08/23/2014) for Physical.

## 2013-08-22 NOTE — Addendum Note (Signed)
Addended by: Theola SequinLIVER, Ellie Spickler L on: 08/22/2013 01:26 PM   Modules accepted: Orders

## 2013-08-22 NOTE — Progress Notes (Signed)
Pre visit review using our clinic review tool, if applicable. No additional management support is needed unless otherwise documented below in the visit note. 

## 2013-08-22 NOTE — Assessment & Plan Note (Signed)
General medical exam normal today including breast exam. PAP and pelvic deferred as PAP normal 2014, HPV neg and [facility]. Will request records on this. Immunizations UTD except for Tdap which was given today. Will check labs including CBC, CMP, lipids. Follow up 1 year and prn.

## 2013-08-23 LAB — VITAMIN D 25 HYDROXY (VIT D DEFICIENCY, FRACTURES): Vit D, 25-Hydroxy: 59 ng/mL (ref 30–89)

## 2013-09-04 ENCOUNTER — Other Ambulatory Visit: Payer: Self-pay | Admitting: Internal Medicine

## 2013-09-05 ENCOUNTER — Other Ambulatory Visit: Payer: Self-pay | Admitting: *Deleted

## 2013-09-05 DIAGNOSIS — R002 Palpitations: Secondary | ICD-10-CM

## 2013-09-05 MED ORDER — METOPROLOL SUCCINATE ER 25 MG PO TB24: 50.0000 mg | ORAL_TABLET | Freq: Every day | ORAL | Status: DC

## 2013-11-30 ENCOUNTER — Other Ambulatory Visit: Payer: Self-pay | Admitting: Internal Medicine

## 2013-11-30 ENCOUNTER — Other Ambulatory Visit: Payer: Self-pay | Admitting: *Deleted

## 2013-11-30 ENCOUNTER — Telehealth: Payer: Self-pay | Admitting: *Deleted

## 2013-11-30 MED ORDER — ESOMEPRAZOLE MAGNESIUM 40 MG PO CPDR: 40.0000 mg | DELAYED_RELEASE_CAPSULE | Freq: Every day | ORAL | Status: DC

## 2013-11-30 NOTE — Telephone Encounter (Signed)
Received PA for Nexium. Started PA online, "Has the patient exhibited intolerance (i.e., sensitivity, drug allergy, adverse effect) to ALL Step 1 prescription PPIs: generic esopemprazole magnesium delayed-release capsules, generic rabeprazole delayed-release tablets, generic omeprazole delayed-release capsules and tablets (Rx AND OTC), generic lansoprazole delayed-release capsules (Rx AND OTC) and orally disintegrating tablets, generic pantoprazole delayed-release tablets, generic omeprazole/sodium bicarbonate capsules (Rx AND OTC), Nexium 24HR, Prilosec OTC, Prevacid 24HR, and Zegerid OTC?  Last PA stated pt had only used omeprazole and pantoprazole previously. Insurance states she needs to have tried all of above.

## 2013-12-03 NOTE — Telephone Encounter (Signed)
OK. See if she is willing to try Nexium 24hr

## 2013-12-06 ENCOUNTER — Telehealth: Payer: Self-pay | Admitting: *Deleted

## 2013-12-06 MED ORDER — LEVONORGEST-ETH ESTRAD 91-DAY 0.15-0.03 &0.01 MG PO TABS: 1.0000 | ORAL_TABLET | Freq: Every day | ORAL | Status: DC

## 2013-12-06 NOTE — Telephone Encounter (Signed)
Pt is asking for birthcontrol to be filled, we have never filled this for her, Okay to fill?

## 2013-12-06 NOTE — Telephone Encounter (Signed)
Rx sent to pharmacy by escript  

## 2013-12-06 NOTE — Telephone Encounter (Signed)
Left vm requesting pt to return my call 

## 2013-12-06 NOTE — Telephone Encounter (Signed)
Fine to fill. 

## 2013-12-06 NOTE — Telephone Encounter (Signed)
Pt states that she will try the Nexium 24hr and has requested a refill on birthcontrol

## 2014-01-01 ENCOUNTER — Other Ambulatory Visit: Payer: Self-pay | Admitting: Internal Medicine

## 2014-02-20 ENCOUNTER — Encounter: Payer: Self-pay | Admitting: Internal Medicine

## 2014-03-02 ENCOUNTER — Other Ambulatory Visit: Payer: Self-pay | Admitting: Internal Medicine

## 2014-04-01 ENCOUNTER — Other Ambulatory Visit: Payer: Self-pay | Admitting: Internal Medicine

## 2014-04-07 ENCOUNTER — Other Ambulatory Visit: Payer: Self-pay | Admitting: Internal Medicine

## 2014-04-25 ENCOUNTER — Ambulatory Visit (INDEPENDENT_AMBULATORY_CARE_PROVIDER_SITE_OTHER): Payer: BC Managed Care – PPO | Admitting: *Deleted

## 2014-04-25 ENCOUNTER — Ambulatory Visit (INDEPENDENT_AMBULATORY_CARE_PROVIDER_SITE_OTHER): Payer: BC Managed Care – PPO | Admitting: Internal Medicine

## 2014-04-25 ENCOUNTER — Encounter: Payer: Self-pay | Admitting: Internal Medicine

## 2014-04-25 VITALS — BP 120/82 | HR 72 | Temp 98.3°F | Ht 63.75 in | Wt 179.2 lb

## 2014-04-25 DIAGNOSIS — G43809 Other migraine, not intractable, without status migrainosus: Secondary | ICD-10-CM

## 2014-04-25 DIAGNOSIS — R002 Palpitations: Secondary | ICD-10-CM

## 2014-04-25 DIAGNOSIS — Z23 Encounter for immunization: Secondary | ICD-10-CM

## 2014-04-25 MED ORDER — ESOMEPRAZOLE MAGNESIUM 40 MG PO CPDR: DELAYED_RELEASE_CAPSULE | ORAL | Status: DC

## 2014-04-25 MED ORDER — LEVOCETIRIZINE DIHYDROCHLORIDE 5 MG PO TABS: 5.0000 mg | ORAL_TABLET | Freq: Every day | ORAL | Status: DC

## 2014-04-25 MED ORDER — METOPROLOL SUCCINATE ER 25 MG PO TB24: 50.0000 mg | ORAL_TABLET | Freq: Every day | ORAL | Status: DC

## 2014-04-25 MED ORDER — LEVONORGEST-ETH ESTRAD 91-DAY 0.15-0.03 &0.01 MG PO TABS: 1.0000 | ORAL_TABLET | Freq: Every day | ORAL | Status: DC

## 2014-04-25 NOTE — Progress Notes (Signed)
   Subjective:    Patient ID: Jillian Anderson, female    DOB: Jan 13, 1986, 28 y.o.   MRN: [id number]  HPI 28YO female presents for follow up.  Feeling well. No concerns. No recent migaine headaches.  No issues with palpitations. Compliant with medication.   Past medical, surgical, family and social history per today's encounter.  Review of Systems  Constitutional: Negative for fever, chills, appetite change, fatigue and unexpected weight change.  Eyes: Negative for visual disturbance.  Respiratory: Negative for shortness of breath.   Cardiovascular: Negative for chest pain, palpitations and leg swelling.  Gastrointestinal: Negative for abdominal pain.  Skin: Negative for color change and rash.  Neurological: Negative for headaches.  Hematological: Negative for adenopathy. Does not bruise/bleed easily.  Psychiatric/Behavioral: Negative for dysphoric mood. The patient is not nervous/anxious.        Objective:    BP 120/82 mmHg  Pulse 72  Temp(Src) 98.3 F (36.8 C) (Oral)  Ht 5' 3.75" (1.619 m)  Wt 179 lb 4 oz (81.307 kg)  BMI 31.02 kg/m2  SpO2 99%  LMP 03/19/2014 (Approximate) Physical Exam  Constitutional: She is oriented to person, place, and time. She appears well-developed and well-nourished. No distress.  HENT:  Head: Normocephalic and atraumatic.  Right Ear: External ear normal.  Left Ear: External ear normal.  Nose: Nose normal.  Mouth/Throat: Oropharynx is clear and moist. No oropharyngeal exudate.  Eyes: Conjunctivae are normal. Pupils are equal, round, and reactive to light. Right eye exhibits no discharge. Left eye exhibits no discharge. No scleral icterus.  Neck: Normal range of motion. Neck supple. No tracheal deviation present. No thyromegaly present.  Cardiovascular: Normal rate, regular rhythm, normal heart sounds and intact distal pulses.  Exam reveals no gallop and no friction rub.   No murmur heard. Pulmonary/Chest: Effort normal and breath  sounds normal. No accessory muscle usage. No tachypnea. No respiratory distress. She has no decreased breath sounds. She has no wheezes. She has no rhonchi. She has no rales. She exhibits no tenderness.  Musculoskeletal: Normal range of motion. She exhibits no edema or tenderness.  Lymphadenopathy:    She has no cervical adenopathy.  Neurological: She is alert and oriented to person, place, and time. No cranial nerve deficit. She exhibits normal muscle tone. Coordination normal.  Skin: Skin is warm and dry. No rash noted. She is not diaphoretic. No erythema. No pallor.  Psychiatric: She has a normal mood and affect. Her behavior is normal. Judgment and thought content normal.          Assessment & Plan:   Problem List Items Addressed This Visit      Unprioritized   Migraines - Primary    Symptoms well controlled. Continue current medications.    Relevant Medications      metoprolol succinate (TOPROL-XL) 24 hr tablet   Palpitations    No recent issues with palpitations. Continue Metoprolol        Return in about 6 months (around 10/25/2014) for Physical.

## 2014-04-25 NOTE — Patient Instructions (Signed)
Follow up in April 2016 for physical

## 2014-04-25 NOTE — Assessment & Plan Note (Signed)
No recent issues with palpitations. Continue Metoprolol

## 2014-04-25 NOTE — Progress Notes (Signed)
Pre visit review using our clinic review tool, if applicable. No additional management support is needed unless otherwise documented below in the visit note. 

## 2014-04-25 NOTE — Assessment & Plan Note (Signed)
Symptoms well controlled.  Continue current medications

## 2014-08-26 ENCOUNTER — Encounter: Payer: BC Managed Care – PPO | Admitting: Internal Medicine

## 2014-12-06 ENCOUNTER — Ambulatory Visit (INDEPENDENT_AMBULATORY_CARE_PROVIDER_SITE_OTHER): Payer: No Typology Code available for payment source | Admitting: Internal Medicine

## 2014-12-06 ENCOUNTER — Encounter: Payer: Self-pay | Admitting: Internal Medicine

## 2014-12-06 VITALS — BP 128/87 | HR 98 | Temp 98.0°F | Ht 63.75 in | Wt 181.5 lb

## 2014-12-06 DIAGNOSIS — Z0000 Encounter for general adult medical examination without abnormal findings: Secondary | ICD-10-CM | POA: Diagnosis not present

## 2014-12-06 DIAGNOSIS — E669 Obesity, unspecified: Secondary | ICD-10-CM | POA: Diagnosis not present

## 2014-12-06 LAB — CBC WITH DIFFERENTIAL/PLATELET
BASOS ABS: 0 10*3/uL (ref 0.0–0.1)
Basophils Relative: 0.3 % (ref 0.0–3.0)
Eosinophils Absolute: 0 10*3/uL (ref 0.0–0.7)
Eosinophils Relative: 0.4 % (ref 0.0–5.0)
HEMATOCRIT: 40.7 % (ref 36.0–46.0)
Hemoglobin: 13.5 g/dL (ref 12.0–15.0)
LYMPHS ABS: 2.5 10*3/uL (ref 0.7–4.0)
LYMPHS PCT: 28.7 % (ref 12.0–46.0)
MCHC: 33.2 g/dL (ref 30.0–36.0)
MCV: 85.9 fl (ref 78.0–100.0)
Monocytes Absolute: 0.4 10*3/uL (ref 0.1–1.0)
Monocytes Relative: 5 % (ref 3.0–12.0)
NEUTROS ABS: 5.7 10*3/uL (ref 1.4–7.7)
NEUTROS PCT: 65.6 % (ref 43.0–77.0)
PLATELETS: 206 10*3/uL (ref 150.0–400.0)
RBC: 4.74 Mil/uL (ref 3.87–5.11)
RDW: 15.7 % — AB (ref 11.5–15.5)
WBC: 8.7 10*3/uL (ref 4.0–10.5)

## 2014-12-06 LAB — COMPREHENSIVE METABOLIC PANEL
ALT: 22 U/L (ref 0–35)
AST: 26 U/L (ref 0–37)
Albumin: 4 g/dL (ref 3.5–5.2)
Alkaline Phosphatase: 70 U/L (ref 39–117)
BILIRUBIN TOTAL: 0.4 mg/dL (ref 0.2–1.2)
BUN: 11 mg/dL (ref 6–23)
CO2: 24 mEq/L (ref 19–32)
Calcium: 9.7 mg/dL (ref 8.4–10.5)
Chloride: 103 mEq/L (ref 96–112)
Creatinine, Ser: 0.7 mg/dL (ref 0.40–1.20)
GFR: 107 mL/min (ref >60.00)
GLUCOSE: 122 mg/dL — AB (ref 70–99)
Potassium: 4 mEq/L (ref 3.5–5.1)
Sodium: 138 mEq/L (ref 135–145)
Total Protein: 7.5 g/dL (ref 6.0–8.3)

## 2014-12-06 LAB — MICROALBUMIN / CREATININE URINE RATIO
Creatinine,U: 229.7 mg/dL
MICROALB UR: 7.3 mg/dL — AB (ref 0.0–1.9)
MICROALB/CREAT RATIO: 3.2 mg/g (ref 0.0–30.0)

## 2014-12-06 LAB — HEMOGLOBIN A1C: HEMOGLOBIN A1C: 5.2 % (ref 4.6–6.5)

## 2014-12-06 LAB — TSH: TSH: 1.07 u[IU]/mL (ref 0.35–4.50)

## 2014-12-06 LAB — MAGNESIUM: MAGNESIUM: 1.9 mg/dL (ref 1.5–2.5)

## 2014-12-06 LAB — VITAMIN D 25 HYDROXY (VIT D DEFICIENCY, FRACTURES): VITD: 42.03 ng/mL (ref 30.00–100.00)

## 2014-12-06 MED ORDER — METOPROLOL SUCCINATE ER 25 MG PO TB24: 50.0000 mg | ORAL_TABLET | Freq: Every day | ORAL | Status: DC

## 2014-12-06 MED ORDER — LEVONORGEST-ETH ESTRAD 91-DAY 0.15-0.03 &0.01 MG PO TABS: 1.0000 | ORAL_TABLET | Freq: Every day | ORAL | Status: DC

## 2014-12-06 MED ORDER — PROMETHAZINE HCL 25 MG PO TABS: 25.0000 mg | ORAL_TABLET | Freq: Four times a day (QID) | ORAL | Status: DC | PRN

## 2014-12-06 MED ORDER — FLUTICASONE-SALMETEROL 250-50 MCG/DOSE IN AEPB: 1.0000 | INHALATION_SPRAY | Freq: Every day | RESPIRATORY_TRACT | Status: DC | PRN

## 2014-12-06 MED ORDER — LEVOCETIRIZINE DIHYDROCHLORIDE 5 MG PO TABS: 5.0000 mg | ORAL_TABLET | Freq: Every day | ORAL | Status: DC

## 2014-12-06 NOTE — Progress Notes (Signed)
Pre visit review using our clinic review tool, if applicable. No additional management support is needed unless otherwise documented below in the visit note. 

## 2014-12-06 NOTE — Assessment & Plan Note (Addendum)
General medical exam normal today. Breast and pelvic exam deferred as completed by OB. PAP UTD. Labs as ordered. Encouraged healthy diet and exercise. Reviewed potential risks of taking combined contraceptives in setting of migraine with aura. She will discuss with OB. Gave CDC handout on guidelines.

## 2014-12-06 NOTE — Assessment & Plan Note (Signed)
Wt Readings from Last 3 Encounters:  12/06/14 181 lb 8 oz (82.328 kg)  04/25/14 179 lb 4 oz (81.307 kg)  08/22/13 168 lb (76.204 kg)   Body mass index is 31.41 kg/(m^2). Encouraged healthy diet and exercise. Will check thyroid function with labs.

## 2014-12-06 NOTE — Patient Instructions (Signed)

## 2014-12-06 NOTE — Addendum Note (Signed)
Addended by: Warden Fillers on: 12/06/2014 03:08 PM   Modules accepted: Kipp Brood

## 2014-12-06 NOTE — Progress Notes (Signed)
Subjective:    Patient ID: Jillian Anderson, female    DOB: 26-Dec-1985, 29 y.o.   MRN: [id number]  HPI  28YO female presents for annual exam.  Frustrated by weight gain. Following healthy diet. Exercising with cardio and weight lifting several days per week.  Wt Readings from Last 3 Encounters:  12/06/14 181 lb 8 oz (82.328 kg)  04/25/14 179 lb 4 oz (81.307 kg)  08/22/13 168 lb (76.204 kg)     Past medical, surgical, family and social history per today's encounter.   Review of Systems  Constitutional: Negative for fever, chills, appetite change, fatigue and unexpected weight change.  Eyes: Negative for visual disturbance.  Respiratory: Negative for shortness of breath.   Cardiovascular: Negative for chest pain and leg swelling.  Gastrointestinal: Negative for nausea, vomiting, abdominal pain, diarrhea, constipation and blood in stool.  Musculoskeletal: Negative for myalgias and arthralgias.  Skin: Negative for color change and rash.  Hematological: Negative for adenopathy. Does not bruise/bleed easily.  Psychiatric/Behavioral: Negative for sleep disturbance and dysphoric mood. The patient is not nervous/anxious.        Objective:    BP 128/87 mmHg  Pulse 98  Temp(Src) 98 F (36.7 C) (Oral)  Ht 5' 3.75" (1.619 m)  Wt 181 lb 8 oz (82.328 kg)  BMI 31.41 kg/m2  SpO2 100%  LMP 10/15/2014 (Approximate) Physical Exam  Constitutional: She is oriented to person, place, and time. She appears well-developed and well-nourished. No distress.  HENT:  Head: Normocephalic and atraumatic.  Right Ear: External ear normal.  Left Ear: External ear normal.  Nose: Nose normal.  Mouth/Throat: Oropharynx is clear and moist. No oropharyngeal exudate.  Eyes: Conjunctivae and EOM are normal. Pupils are equal, round, and reactive to light. Right eye exhibits no discharge. Left eye exhibits no discharge. No scleral icterus.  Neck: Normal range of motion. Neck supple. No tracheal  deviation present. No thyromegaly present.  Cardiovascular: Normal rate, regular rhythm, normal heart sounds and intact distal pulses.  Exam reveals no gallop and no friction rub.   No murmur heard. Pulmonary/Chest: Effort normal and breath sounds normal. No respiratory distress. She has no wheezes. She has no rales. She exhibits no tenderness.  Abdominal: Soft. Bowel sounds are normal. She exhibits no distension and no mass. There is no tenderness. There is no rebound and no guarding.  Musculoskeletal: Normal range of motion. She exhibits no edema or tenderness.  Lymphadenopathy:    She has no cervical adenopathy.  Neurological: She is alert and oriented to person, place, and time. No cranial nerve deficit. She exhibits normal muscle tone. Coordination normal.  Skin: Skin is warm and dry. No rash noted. She is not diaphoretic. No erythema. No pallor.  Psychiatric: She has a normal mood and affect. Her behavior is normal. Judgment and thought content normal.          Assessment & Plan:   Problem List Items Addressed This Visit      Unprioritized   Obesity (BMI 30-39.9)    Wt Readings from Last 3 Encounters:  12/06/14 181 lb 8 oz (82.328 kg)  04/25/14 179 lb 4 oz (81.307 kg)  08/22/13 168 lb (76.204 kg)   Body mass index is 31.41 kg/(m^2). Encouraged healthy diet and exercise. Will check thyroid function with labs.      Routine general medical examination at a health care facility - Primary    General medical exam normal today. Breast and pelvic exam deferred as completed by OB. PAP  UTD. Labs as ordered. Encouraged healthy diet and exercise. Reviewed potential risks of taking combined contraceptives in setting of migraine with aura. She will discuss with OB. Gave CDC handout on guidelines.      Relevant Orders   TSH   Comprehensive metabolic panel   Hemoglobin A1c   CBC with Differential/Platelet   Microalbumin / creatinine urine ratio   Vit D  25 hydroxy (rtn osteoporosis  monitoring)   Magnesium       Return in about 4 weeks (around 01/03/2015) for Recheck.

## 2015-01-24 ENCOUNTER — Ambulatory Visit: Payer: No Typology Code available for payment source | Admitting: Internal Medicine

## 2015-02-07 ENCOUNTER — Encounter: Payer: Self-pay | Admitting: Family Medicine

## 2015-02-07 ENCOUNTER — Ambulatory Visit: Payer: Self-pay | Admitting: Internal Medicine

## 2015-02-07 ENCOUNTER — Ambulatory Visit (INDEPENDENT_AMBULATORY_CARE_PROVIDER_SITE_OTHER): Payer: No Typology Code available for payment source | Admitting: Family Medicine

## 2015-02-07 VITALS — BP 128/78 | HR 69 | Temp 98.4°F | Ht 63.75 in | Wt 182.6 lb

## 2015-02-07 DIAGNOSIS — J329 Chronic sinusitis, unspecified: Secondary | ICD-10-CM | POA: Insufficient documentation

## 2015-02-07 DIAGNOSIS — J0100 Acute maxillary sinusitis, unspecified: Secondary | ICD-10-CM

## 2015-02-07 MED ORDER — AMOXICILLIN-POT CLAVULANATE 875-125 MG PO TABS: 1.0000 | ORAL_TABLET | Freq: Two times a day (BID) | ORAL | Status: DC

## 2015-02-07 NOTE — Progress Notes (Signed)
Patient ID: Jillian Anderson, female   DOB: 11-11-1985, 29 y.o.   MRN: [id number]  Marikay Alar, MD Phone: [phone]  Jillian Anderson is a 29 y.o. female who presents today for same day appointment.   Sinus infection: started with sore throat and maxillary sinus pressure and congestion last 6 days ago. Throat has improved some, though the sinus congestion and pressure has worsened. Has mild cough, though no production or dyspnea. Had temp to 99.3 on one occasion though felt feverish on several other occasions. Had chills last night. Sick contacts at work. Has tried phenylephrine, tylenol, and ibuprofen without much benefit. Antibiotic allergy to ceclor with vomiting. States has taken and tolerated amoxicillin and augmentin in the past.   PMH: nonsmoker.   ROS see HPI  Objective  Physical Exam Filed Vitals:   02/07/15 0830  BP: 128/78  Pulse: 69  Temp: 98.4 F (36.9 C)    Physical Exam  Constitutional: She is well-developed, well-nourished, and in no distress.  HENT:  Head: Normocephalic and atraumatic.  Right Ear: External ear normal.  Left Ear: External ear normal.  Mouth/Throat: Oropharynx is clear and moist. No oropharyngeal exudate.  Normal TM bilaterally, mild tenderness of maxillary and frontal sinuses  Eyes: Conjunctivae are normal. Pupils are equal, round, and reactive to light.  Neck: Neck supple.  Cardiovascular: Normal rate, regular rhythm and normal heart sounds.  Exam reveals no gallop and no friction rub.   No murmur heard. Pulmonary/Chest: Effort normal and breath sounds normal. No respiratory distress. She has no wheezes. She has no rales.  Lymphadenopathy:    She has no cervical adenopathy.  Skin: Skin is warm and dry. She is not diaphoretic.     Assessment/Plan: Please see individual problem list.  Sinusitis Symptoms consistent with sinusitis. 6 days of symptoms with no improvement and severe congestion and pressure thus will treat as a  bacterial infection. Start on augmentin. Confirmed with patient that she has tolerated this medication in the past and she stated that she had. Given return precautions.     Meds ordered this encounter  Medications  . amoxicillin-clavulanate (AUGMENTIN) 875-125 MG tablet    Sig: Take 1 tablet by mouth 2 (two) times daily.    Dispense:  14 tablet    Refill:  0    Marikay Alar

## 2015-02-07 NOTE — Assessment & Plan Note (Signed)
Symptoms consistent with sinusitis. 6 days of symptoms with no improvement and severe congestion and pressure thus will treat as a bacterial infection. Start on augmentin. Confirmed with patient that she has tolerated this medication in the past and she stated that she had. Given return precautions.

## 2015-02-07 NOTE — Patient Instructions (Signed)
Nice to meet you. You have a sinus infection.  We will treat this with augmentin. If you develop fever, chills, shortness of breath, productive cough, nausea, vomiting, rash, issues breathing or swallowing please seek medical attention.

## 2015-02-07 NOTE — Progress Notes (Signed)
Pre visit review using our clinic review tool, if applicable. No additional management support is needed unless otherwise documented below in the visit note. 

## 2015-05-06 ENCOUNTER — Other Ambulatory Visit: Payer: Self-pay | Admitting: Internal Medicine

## 2015-05-21 ENCOUNTER — Ambulatory Visit (INDEPENDENT_AMBULATORY_CARE_PROVIDER_SITE_OTHER): Payer: Self-pay | Admitting: Family Medicine

## 2015-05-21 ENCOUNTER — Encounter: Payer: Self-pay | Admitting: Family Medicine

## 2015-05-21 VITALS — BP 112/70 | HR 78 | Temp 98.3°F | Ht 63.75 in | Wt 191.2 lb

## 2015-05-21 DIAGNOSIS — H9201 Otalgia, right ear: Secondary | ICD-10-CM

## 2015-05-21 MED ORDER — FLUTICASONE PROPIONATE 50 MCG/ACT NA SUSP: 2.0000 | Freq: Every day | NASAL | Status: DC

## 2015-05-21 NOTE — Progress Notes (Signed)
Pre visit review using our clinic review tool, if applicable. No additional management support is needed unless otherwise documented below in the visit note. 

## 2015-05-21 NOTE — Assessment & Plan Note (Signed)
Patient notes right ear pain starting yesterday. Has benign exam today. Could be related to allergies given itching and dull ache in ear. Also could be related to TMJ. No signs of infection. We'll start her on Flonase for potential allergy component. She'll continue Xyzal. She'll continue to monitor. Given return precautions.

## 2015-05-21 NOTE — Patient Instructions (Signed)
Nice to see you. You have no findings of an ear infection.  Your symptoms could be related to your TMJ or allergies. Please start on flonase.  If you develop increasing pain, fever, headache, numbness, weakness, vision changes, or any new or change in symptoms please seek medical attention.

## 2015-05-21 NOTE — Progress Notes (Signed)
Patient ID: Jillian Anderson, female   DOB: 1985/10/27, 29 y.o.   MRN: [id number]  Marikay Alar, MD Phone: [phone]  Jillian Anderson is a 30 y.o. female who presents today for same-day visit.  Patient notes yesterday she felt her right ear pop. She notes she had had headphones in bed funds out and then felt a pop. Notes it is been a dull ache in that area since then. Notes her ears and itching. She now she is hearing well. No ringing in her ears. No drainage from ears. No congestion or rhinorrhea or fever. She does have a history of TMJ, though she reports this feels different than that. She is taking Xyzal for allergies.  PMH: nonsmoker.   ROS see history of present illness  Objective  Physical Exam Filed Vitals:   05/21/15 1026  BP: 112/70  Pulse: 78  Temp: 98.3 F (36.8 C)    BP Readings from Last 3 Encounters:  05/21/15 112/70  02/07/15 128/78  12/06/14 128/87   Wt Readings from Last 3 Encounters:  05/21/15 191 lb 3.2 oz (86.728 kg)  02/07/15 182 lb 9.6 oz (82.827 kg)  12/06/14 181 lb 8 oz (82.328 kg)    Physical Exam  Constitutional: She is well-developed, well-nourished, and in no distress.  HENT:  Head: Normocephalic and atraumatic.  Right Ear: External ear normal.  Left Ear: External ear normal.  Mouth/Throat: Oropharynx is clear and moist. No oropharyngeal exudate.  Normal TMs bilaterally, exam of TMJ bilaterally reveals mild clunk on opening mouth  Eyes: Conjunctivae are normal. Pupils are equal, round, and reactive to light.  Neck: Neck supple.  Cardiovascular: Normal rate, regular rhythm and normal heart sounds.  Exam reveals no gallop and no friction rub.   No murmur heard. Pulmonary/Chest: Effort normal and breath sounds normal. No respiratory distress. She has no wheezes. She has no rales.  Lymphadenopathy:    She has no cervical adenopathy.  Neurological: She is alert. Gait normal.  Skin: Skin is warm and dry. She is not diaphoretic.      Assessment/Plan: Please see individual problem list.  Right ear pain Patient notes right ear pain starting yesterday. Has benign exam today. Could be related to allergies given itching and dull ache in ear. Also could be related to TMJ. No signs of infection. We'll start her on Flonase for potential allergy component. She'll continue Xyzal. She'll continue to monitor. Given return precautions.    Meds ordered this encounter  Medications  . fluticasone (FLONASE) 50 MCG/ACT nasal spray    Sig: Place 2 sprays into both nostrils daily.    Dispense:  16 g    Refill:  6     Marikay Alar

## 2015-09-04 ENCOUNTER — Ambulatory Visit: Payer: Self-pay | Admitting: Internal Medicine

## 2015-09-12 ENCOUNTER — Ambulatory Visit (INDEPENDENT_AMBULATORY_CARE_PROVIDER_SITE_OTHER): Payer: BLUE CROSS/BLUE SHIELD | Admitting: Internal Medicine

## 2015-09-12 ENCOUNTER — Encounter: Payer: Self-pay | Admitting: Internal Medicine

## 2015-09-12 VITALS — BP 124/76 | HR 70 | Temp 89.9°F | Ht 63.75 in | Wt 191.6 lb

## 2015-09-12 DIAGNOSIS — L739 Follicular disorder, unspecified: Secondary | ICD-10-CM | POA: Diagnosis not present

## 2015-09-12 DIAGNOSIS — L309 Dermatitis, unspecified: Secondary | ICD-10-CM | POA: Diagnosis not present

## 2015-09-12 MED ORDER — GENTAMICIN SULFATE 0.1 % EX OINT: 1.0000 "application " | TOPICAL_OINTMENT | Freq: Three times a day (TID) | CUTANEOUS | Status: DC

## 2015-09-12 MED ORDER — DOXYCYCLINE HYCLATE 100 MG PO CAPS: 100.0000 mg | ORAL_CAPSULE | Freq: Two times a day (BID) | ORAL | Status: DC

## 2015-09-12 MED ORDER — TRIAMCINOLONE 0.1 % CREAM:EUCERIN CREAM 1:1: 1.0000 "application " | TOPICAL_CREAM | Freq: Two times a day (BID) | CUTANEOUS | Status: DC | PRN

## 2015-09-12 NOTE — Progress Notes (Signed)
Subjective:    Patient ID: Jillian Anderson, female    DOB: 09-17-1985, 30 y.o.   MRN: [id number]  HPI  30YO female presents for acute visit.  Lump on inner thigh. First noted a few weeks ago after running on treadmill. No redness or drainage. No fever. Also having flare of eczema on back. No new soaps, lotions. No improvement with Xyzal. Using lotion with no improvement. Area is itchy.   Wt Readings from Last 3 Encounters:  09/12/15 191 lb 9.6 oz (86.909 kg)  05/21/15 191 lb 3.2 oz (86.728 kg)  02/07/15 182 lb 9.6 oz (82.827 kg)   BP Readings from Last 3 Encounters:  09/12/15 124/76  05/21/15 112/70  02/07/15 128/78    Past Medical History  Diagnosis Date  . Allergy   . Migraines    No family history on file. No past surgical history on file. Social History   Social History  . Marital Status: Single    Spouse Name: N/A  . Number of Children: N/A  . Years of Education: N/A   Social History Main Topics  . Smoking status: Never Smoker   . Smokeless tobacco: Never Used  . Alcohol Use: 0.0 oz/week    0 Standard drinks or equivalent per week     Comment: occassionally  . Drug Use: No  . Sexual Activity: Not Asked   Other Topics Concern  . None   Social History Narrative    Review of Systems  Constitutional: Negative for fever, chills, appetite change, fatigue and unexpected weight change.  Eyes: Negative for visual disturbance.  Respiratory: Negative for shortness of breath.   Cardiovascular: Negative for chest pain and leg swelling.  Gastrointestinal: Negative for abdominal pain.  Skin: Positive for color change and rash.  Hematological: Negative for adenopathy. Does not bruise/bleed easily.  Psychiatric/Behavioral: Negative for dysphoric mood. The patient is not nervous/anxious.        Objective:    BP 124/76 mmHg  Pulse 70  Temp(Src) 89.9 F (32.2 C) (Oral)  Ht 5' 3.75" (1.619 m)  Wt 191 lb 9.6 oz (86.909 kg)  BMI 33.16 kg/m2  SpO2 98%   LMP 08/01/2015 Physical Exam  Constitutional: She is oriented to person, place, and time. She appears well-developed and well-nourished. No distress.  HENT:  Head: Normocephalic and atraumatic.  Right Ear: External ear normal.  Left Ear: External ear normal.  Nose: Nose normal.  Mouth/Throat: Oropharynx is clear and moist.  Eyes: Conjunctivae are normal. Pupils are equal, round, and reactive to light. Right eye exhibits no discharge. Left eye exhibits no discharge. No scleral icterus.  Neck: Normal range of motion. Neck supple. No tracheal deviation present. No thyromegaly present.  Cardiovascular: Normal rate, regular rhythm, normal heart sounds and intact distal pulses.  Exam reveals no gallop and no friction rub.   No murmur heard. Pulmonary/Chest: Effort normal and breath sounds normal. No respiratory distress. She has no wheezes. She has no rales. She exhibits no tenderness.  Musculoskeletal: Normal range of motion. She exhibits no edema or tenderness.  Lymphadenopathy:    She has no cervical adenopathy.  Neurological: She is alert and oriented to person, place, and time. No cranial nerve deficit. She exhibits normal muscle tone. Coordination normal.  Skin: Skin is warm and dry. Rash noted. Rash is maculopapular. She is not diaphoretic. There is erythema. No pallor.     Psychiatric: She has a normal mood and affect. Her behavior is normal. Judgment and thought content normal.  Assessment & Plan:   Problem List Items Addressed This Visit      Unprioritized   Dermatitis    Lower back maculopapular rash, most consistent with dermatitis or eczema. Will start topical Triamcinolone:Eucerine cream. Follow up prn if symptoms are not improving.      Folliculitis - Primary    Right medial thigh small pustule. Will start topical gentamicin. If no improvement, then will start Doxycycline. Follow up if symptoms are not improving.          Return in about 4 weeks (around  10/10/2015) for Recheck.  Ronna Polio, MD Internal Medicine [facility]

## 2015-09-12 NOTE — Assessment & Plan Note (Signed)
Right medial thigh small pustule. Will start topical gentamicin. If no improvement, then will start Doxycycline. Follow up if symptoms are not improving.

## 2015-09-12 NOTE — Progress Notes (Signed)
Pre visit review using our clinic review tool, if applicable. No additional management support is needed unless otherwise documented below in the visit note. 

## 2015-09-12 NOTE — Assessment & Plan Note (Signed)
Lower back maculopapular rash, most consistent with dermatitis or eczema. Will start topical Triamcinolone:Eucerine cream. Follow up prn if symptoms are not improving.

## 2015-09-12 NOTE — Patient Instructions (Signed)
Start Gentamicin ointment 2-3 times daily to right inner thigh. If symptoms are not improving after 3-4 days, then start Doxycycline twice daily.  Start Eucerine-Triamcinolone cream to back twice daily.  Please let us know if symptoms are not improving.

## 2015-09-17 ENCOUNTER — Encounter: Payer: Self-pay | Admitting: Internal Medicine

## 2015-09-17 ENCOUNTER — Other Ambulatory Visit: Payer: Self-pay | Admitting: Internal Medicine

## 2015-09-17 MED ORDER — SULFAMETHOXAZOLE-TRIMETHOPRIM 800-160 MG PO TABS: 1.0000 | ORAL_TABLET | Freq: Two times a day (BID) | ORAL | Status: DC

## 2015-09-17 MED ORDER — GENTAMICIN SULFATE 0.1 % EX OINT: 1.0000 "application " | TOPICAL_OINTMENT | Freq: Three times a day (TID) | CUTANEOUS | Status: DC

## 2015-09-17 NOTE — Telephone Encounter (Signed)
[facility] called gentamycin is not covered with patient's insurance.   Mupirocin will be covered, please advise?

## 2015-10-13 ENCOUNTER — Encounter: Payer: Self-pay | Admitting: Emergency Medicine

## 2015-10-13 ENCOUNTER — Emergency Department
Admission: EM | Admit: 2015-10-13 | Discharge: 2015-10-13 | Disposition: A | Discharge status: Home / Self Care | Payer: BLUE CROSS/BLUE SHIELD | Attending: Emergency Medicine | Admitting: Emergency Medicine

## 2015-10-13 ENCOUNTER — Emergency Department: Payer: BLUE CROSS/BLUE SHIELD

## 2015-10-13 DIAGNOSIS — Z79899 Other long term (current) drug therapy: Secondary | ICD-10-CM | POA: Diagnosis not present

## 2015-10-13 DIAGNOSIS — N938 Other specified abnormal uterine and vaginal bleeding: Secondary | ICD-10-CM | POA: Diagnosis not present

## 2015-10-13 DIAGNOSIS — Z7951 Long term (current) use of inhaled steroids: Secondary | ICD-10-CM | POA: Diagnosis not present

## 2015-10-13 HISTORY — DX: Unspecified astigmatism, unspecified eye: H52.209

## 2015-10-13 HISTORY — DX: Polycystic ovarian syndrome: E28.2

## 2015-10-13 HISTORY — DX: Tachycardia, unspecified: R00.0

## 2015-10-13 LAB — WET PREP, GENITAL
Clue Cells Wet Prep HPF POC: NONE SEEN
Sperm: NONE SEEN
Trich, Wet Prep: NONE SEEN
Yeast Wet Prep HPF POC: NONE SEEN

## 2015-10-13 LAB — CHLAMYDIA/NGC RT PCR (ARMC ONLY)
CHLAMYDIA TR: NOT DETECTED
N GONORRHOEAE: NOT DETECTED

## 2015-10-13 LAB — CBC
HEMATOCRIT: 39.1 % (ref 35.0–47.0)
Hemoglobin: 12.9 g/dL (ref 12.0–16.0)
MCH: 29.3 pg (ref 26.0–34.0)
MCHC: 33 g/dL (ref 32.0–36.0)
MCV: 88.8 fL (ref 80.0–100.0)
PLATELETS: 218 10*3/uL (ref 150–440)
RBC: 4.4 MIL/uL (ref 3.80–5.20)
RDW: 14 % (ref 11.5–14.5)
WBC: 7.2 10*3/uL (ref 3.6–11.0)

## 2015-10-13 LAB — HCG, QUANTITATIVE, PREGNANCY: hCG, Beta Chain, Quant, S: <1 m[IU]/mL (ref <5)

## 2015-10-13 LAB — POCT PREGNANCY, URINE: Preg Test, Ur: NEGATIVE

## 2015-10-13 NOTE — ED Notes (Signed)
Pt states she has had vaginal bleeding since Thursday that is abnormal for her (typically has periods every 3 months for only 3 days).  Pt states flow is much heavier than before and cramping is worse than normal.  Pt states she is passing large clots.  Pt took pregnancy test 2 mos ago, which was negative.

## 2015-10-13 NOTE — Discharge Instructions (Signed)

## 2015-10-13 NOTE — ED Provider Notes (Signed)
[facility] Emergency Department Provider Note  Time seen: 3:35 PM  I have reviewed the triage vital signs and the nursing notes.   HISTORY  Chief Complaint Vaginal Bleeding    HPI Palmyra L Marrow is a 30 y.o. female with a past medical history of migraines, polycystic ovarian syndrome, Zestril emergency department for heavy vaginal bleeding. According to the patient she takes a birth control in which she is only supposed to have a period every 3 months. She states she is not supposed at the period this month but began bleeding 4 days ago. Describes the mesh of bleeding as significant, occasionally with large clots. Patient also states lower abdominal cramping. Denies any fever, nausea, vomiting, diarrhea or dysuria. Patient thought she could be having a miscarriage, but states she has not taken a pregnancy test, has never been pregnant in the past.     Past Medical History  Diagnosis Date  . Allergy   . Migraines   . Tachycardia   . Polycystic disease, ovaries   . Astigmatism     Patient Active Problem List   Diagnosis Date Noted  . Folliculitis 09/12/2015  . Dermatitis 09/12/2015  . Obesity (BMI 30-39.9) 12/06/2014  . GERD (gastroesophageal reflux disease) 08/01/2013  . Dysmenorrhea 09/22/2012  . Routine general medical examination at a health care facility 08/16/2012  . Nephrolithiasis 09/03/2011  . Palpitations 07/08/2011  . IBS (irritable bowel syndrome) 07/08/2011  . Migraines 01/06/2011  . Allergic rhinitis 01/06/2011    Past Surgical History  Procedure Laterality Date  . Wisdom tooth extraction      Current Outpatient Rx  Name  Route  Sig  Dispense  Refill  . doxycycline (VIBRAMYCIN) 100 MG capsule   Oral   Take 1 capsule (100 mg total) by mouth 2 (two) times daily.   20 capsule   0   . esomeprazole (NEXIUM) 40 MG capsule      TAKE 1 CAPSULE BY MOUTH EVERY DAY   90 capsule   3     **Patient requests 90 days supply**    . fluticasone (FLONASE) 50 MCG/ACT nasal spray   Each Nare   Place 2 sprays into both nostrils daily.   16 g   6   . Fluticasone-Salmeterol (ADVAIR) 250-50 MCG/DOSE AEPB   Inhalation   Inhale 1 puff into the lungs daily as needed.   60 each   1   . gentamicin ointment (GARAMYCIN) 0.1 %   Topical   Apply 1 application topically 3 (three) times daily.   15 g   0   . levocetirizine (XYZAL) 5 MG tablet   Oral   Take 1 tablet (5 mg total) by mouth daily.   90 tablet   3   . metoprolol succinate (TOPROL-XL) 25 MG 24 hr tablet   Oral   Take 2 tablets (50 mg total) by mouth daily.   180 tablet   3   . Multiple Vitamin (MULTIVITAMIN) capsule   Oral   Take 1 capsule by mouth daily.           . Probiotic Product (PROBIOTIC FORMULA) CAPS   Oral   Take 2 capsules by mouth daily.         . promethazine (PHENERGAN) 25 MG tablet   Oral   Take 1 tablet (25 mg total) by mouth every 6 (six) hours as needed.   30 tablet   0   . SEASONIQUE 0.15-0.03 &0.01 MG tablet  TAKE 1 TABLET BY MOUTH EVERY DAY   91 tablet   3   . sulfamethoxazole-trimethoprim (BACTRIM DS,SEPTRA DS) 800-160 MG tablet   Oral   Take 1 tablet by mouth 2 (two) times daily.   14 tablet   0   . SUMAtriptan-naproxen (TREXIMET) 85-500 MG per tablet   Oral   Take 1 tablet by mouth Once PRN.           Marland Kitchen. tiZANidine (ZANAFLEX) 4 MG capsule   Oral   Take 4 mg by mouth every 6 (six) hours as needed.           . Triamcinolone Acetonide (TRIAMCINOLONE 0.1 % CREAM : EUCERIN) CREA   Topical   Apply 1 application topically 2 (two) times daily as needed.   1 each   1     Allergies Ceclor; Codeine; Raspberry; and Red dye  No family history on file.  Social History Social History  Substance Use Topics  . Smoking status: Never Smoker   . Smokeless tobacco: Never Used  . Alcohol Use: 0.6 oz/week    0 Standard drinks or equivalent, 1 Glasses of wine per week     Comment: occassionally     Review of Systems Constitutional: Negative for fever. Cardiovascular: Negative for chest pain. Respiratory: Negative for shortness of breath. Gastrointestinal: Lower abdominal cramping. Negative for nausea, vomiting, diarrhea. Genitourinary: Negative for dysuria. Positive for vaginal bleeding. Musculoskeletal: Negative for back pain. Neurological: Negative for headache 10-point ROS otherwise negative.  ____________________________________________   PHYSICAL EXAM:  VITAL SIGNS: ED Triage Vitals  Enc Vitals Group     BP 10/13/15 1318 158/95 mmHg     Pulse Rate 10/13/15 1318 91     Resp 10/13/15 1318 20     Temp 10/13/15 1318 98.8 F (37.1 C)     Temp Source 10/13/15 1318 Oral     SpO2 10/13/15 1318 100 %     Weight 10/13/15 1318 168 lb (76.204 kg)     Height 10/13/15 1318 5\' 2"  (1.575 m)     Head Cir --      Peak Flow --      Pain Score --      Pain Loc --      Pain Edu? --      Excl. in GC? --     Constitutional: Alert and oriented. Well appearing and in no distress. Eyes: Normal exam ENT   Head: Normocephalic and atraumatic.   Mouth/Throat: Mucous membranes are moist. Cardiovascular: Normal rate, regular rhythm. No murmur Respiratory: Normal respiratory effort without tachypnea nor retractions. Breath sounds are clear  Gastrointestinal: Soft and nontender. No distention.   Musculoskeletal: Nontender with normal range of motion in all extremities. Neurologic:  Normal speech and language. No gross focal neurologic deficits  Skin:  Skin is warm, dry and intact.  Psychiatric: Mood and affect are normal.   ____________________________________________   INITIAL IMPRESSION / ASSESSMENT AND PLAN / ED COURSE  Pertinent labs & imaging results that were available during my care of the patient were reviewed by me and considered in my medical decision making (see chart for details).  Patient presents the emergency department increased vaginal bleeding over the  past 4 days. States she does not believe she is supposed to have a period this month due to the birth control that she takes. Patient's principal test is negative. Beta hCG test is negative. We will perform a pelvic examination, based on the pelvic exam we will decide if  we need to proceed with an ultrasound today. Patient's blood beta hCG level is negative. I have added on a CBC.  Moderate vaginal bleeding on pelvic exam, mild cervical motion tenderness and left adnexal tenderness, no discharge noted. I have sent swabs. We'll obtain an ultrasound.  Ultrasound shows blood within the endometrial canal, cannot rule out clot. Overall the patient appears well, normal vitals, labs are reassuring. We will discharge home with OB/GYN follow-up.  ____________________________________________   FINAL CLINICAL IMPRESSION(S) / ED DIAGNOSES  Dysfunctional uterine bleeding   Minna Antis, MD 10/13/15 1737

## 2015-10-14 ENCOUNTER — Ambulatory Visit: Payer: Self-pay | Admitting: Internal Medicine

## 2015-10-15 ENCOUNTER — Encounter: Payer: Self-pay | Admitting: Internal Medicine

## 2015-12-23 ENCOUNTER — Other Ambulatory Visit: Payer: Self-pay | Admitting: Internal Medicine

## 2015-12-23 ENCOUNTER — Telehealth: Payer: Self-pay | Admitting: Internal Medicine

## 2015-12-23 NOTE — Telephone Encounter (Signed)
Pt called needing a refill for metoprolol succinate (TOPROL-XL) 25 MG 24 hr tablet.  Pt has a sch a appt for 10/19.  [phone] - [place], [place] - [address] [facility] [address]  Call pt @ [phone]. Thank you!

## 2015-12-24 MED ORDER — METOPROLOL SUCCINATE ER 25 MG PO TB24: 50.0000 mg | ORAL_TABLET | Freq: Every day | ORAL | 3 refills | Status: DC

## 2015-12-24 NOTE — Telephone Encounter (Signed)
Refilled. thanks

## 2016-02-19 ENCOUNTER — Ambulatory Visit: Payer: BLUE CROSS/BLUE SHIELD | Admitting: Family Medicine

## 2016-03-11 ENCOUNTER — Encounter: Payer: Self-pay | Admitting: Obstetrics and Gynecology

## 2016-03-11 ENCOUNTER — Ambulatory Visit (INDEPENDENT_AMBULATORY_CARE_PROVIDER_SITE_OTHER): Payer: BLUE CROSS/BLUE SHIELD | Admitting: Obstetrics and Gynecology

## 2016-03-11 VITALS — BP 136/75 | HR 85 | Ht 62.0 in | Wt 189.3 lb

## 2016-03-11 DIAGNOSIS — R000 Tachycardia, unspecified: Secondary | ICD-10-CM

## 2016-03-11 DIAGNOSIS — G43709 Chronic migraine without aura, not intractable, without status migrainosus: Secondary | ICD-10-CM | POA: Diagnosis not present

## 2016-03-11 DIAGNOSIS — Z3201 Encounter for pregnancy test, result positive: Secondary | ICD-10-CM | POA: Diagnosis not present

## 2016-03-11 DIAGNOSIS — O09891 Supervision of other high risk pregnancies, first trimester: Secondary | ICD-10-CM

## 2016-03-11 DIAGNOSIS — E282 Polycystic ovarian syndrome: Secondary | ICD-10-CM

## 2016-03-11 DIAGNOSIS — E669 Obesity, unspecified: Secondary | ICD-10-CM

## 2016-03-11 DIAGNOSIS — J4520 Mild intermittent asthma, uncomplicated: Secondary | ICD-10-CM | POA: Diagnosis not present

## 2016-03-11 DIAGNOSIS — Z3A01 Less than 8 weeks gestation of pregnancy: Secondary | ICD-10-CM | POA: Diagnosis not present

## 2016-03-11 LAB — POCT URINE PREGNANCY: PREG TEST UR: POSITIVE — AB

## 2016-03-11 MED ORDER — CITRANATAL B-CALM 20-1 MG & 2 X 25 MG PO MISC
20.0000 mg | Freq: Every day | ORAL | 11 refills | Status: DC
Start: 2016-03-11 — End: 2016-10-21

## 2016-03-11 MED ORDER — CITRANATAL HARMONY 27-1-260 MG PO CAPS: 27.0000 mg | ORAL_CAPSULE | Freq: Every day | ORAL | 11 refills | Status: DC

## 2016-03-11 MED ORDER — CYCLOBENZAPRINE HCL 10 MG PO TABS: 10.0000 mg | ORAL_TABLET | Freq: Three times a day (TID) | ORAL | 2 refills | Status: DC | PRN

## 2016-03-11 NOTE — Progress Notes (Signed)
GYNECOLOGY CLINIC PROGRESS NOE Subjective:    Jillian Anderson is a 30 y.o. female who presents for evaluation of amenorrhea and menstrual irregularity. She believes she could be pregnant.  Pregnancy is desired. Sexual Activity: single partner, contraception: previously on OCPs (continous regimen), stopped in June 2017. Current symptoms also include: breast tenderness, nausea and positive home pregnancy test. Last period was normal.  Patient's last menstrual period was 01/21/2016.    Past Medical History:  Diagnosis Date  . Allergy   . Asthma without status asthmaticus 03/15/2016  . Astigmatism   . Migraines   . Polycystic disease, ovaries   . Tachycardia     Family History  Problem Relation Age of Onset  . Hypertension Mother   . Hyperlipidemia Father   . Diabetes Father   . Asthma Brother     Past Surgical History:  Procedure Laterality Date  . WISDOM TOOTH EXTRACTION      Social History   Social History  . Marital status: Married    Spouse name: N/A  . Number of children: N/A  . Years of education: N/A   Occupational History  . Not on file.   Social History Main Topics  . Smoking status: Never Smoker  . Smokeless tobacco: Never Used  . Alcohol use No     Comment: occassionally  . Drug use: No  . Sexual activity: Yes    Birth control/ protection: None   Other Topics Concern  . Not on file   Social History Narrative  . No narrative on file    Outpatient Encounter Prescriptions as of 03/11/2016  Medication Sig  . esomeprazole (NEXIUM) 40 MG capsule TAKE 1 CAPSULE BY MOUTH EVERY DAY  . levocetirizine (XYZAL) 5 MG tablet Take 1 tablet (5 mg total) by mouth daily.  . Probiotic Product (PROBIOTIC FORMULA) CAPS Take 2 capsules by mouth daily.  . promethazine (PHENERGAN) 25 MG tablet Take 1 tablet (25 mg total) by mouth every 6 (six) hours as needed.  . [DISCONTINUED] metoprolol succinate (TOPROL-XL) 25 MG 24 hr tablet Take 2 tablets (50 mg total) by mouth  daily.  . [DISCONTINUED] SUMAtriptan-naproxen (TREXIMET) 85-500 MG per tablet Take 1 tablet by mouth Once PRN.    . [DISCONTINUED] tiZANidine (ZANAFLEX) 4 MG capsule Take 4 mg by mouth every 6 (six) hours as needed.    . cyclobenzaprine (FLEXERIL) 10 MG tablet Take 1 tablet (10 mg total) by mouth 3 (three) times daily as needed for muscle spasms.  Burnis Medin. Prenat w/o A FeCbnFeGlu-FA &B6 (CITRANATAL B-CALM) 20-1 & 25 (2) MG MISC Take 20 mg by mouth daily.  . [DISCONTINUED] doxycycline (VIBRAMYCIN) 100 MG capsule Take 1 capsule (100 mg total) by mouth 2 (two) times daily.  . [DISCONTINUED] fluticasone (FLONASE) 50 MCG/ACT nasal spray Place 2 sprays into both nostrils daily.  . [DISCONTINUED] Fluticasone-Salmeterol (ADVAIR) 250-50 MCG/DOSE AEPB Inhale 1 puff into the lungs daily as needed.  . [DISCONTINUED] gentamicin ointment (GARAMYCIN) 0.1 % Apply 1 application topically 3 (three) times daily.  . [DISCONTINUED] Multiple Vitamin (MULTIVITAMIN) capsule Take 1 capsule by mouth daily.    . [DISCONTINUED] Prenat-FeFmCb-DSS-FA-DHA w/o A (CITRANATAL HARMONY) 27-1-260 MG CAPS Take 27 mg by mouth daily.  . [DISCONTINUED] SEASONIQUE 0.15-0.03 &0.01 MG tablet TAKE 1 TABLET BY MOUTH EVERY DAY  . [DISCONTINUED] sulfamethoxazole-trimethoprim (BACTRIM DS,SEPTRA DS) 800-160 MG tablet Take 1 tablet by mouth 2 (two) times daily.  . [DISCONTINUED] Triamcinolone Acetonide (TRIAMCINOLONE 0.1 % CREAM : EUCERIN) CREA Apply 1 application topically 2 (  two) times daily as needed.   No facility-administered encounter medications on file as of 03/11/2016.      Allergies  Allergen Reactions  . Tour managerBlackberry Flavor   . Ceclor [Cefaclor]     hives  . Codeine     Body fights itself eyes dilate  . Raspberry   . Red Dye     Hives, nausea and migraines      Review of Systems Pertinent items noted in HPI and remainder of comprehensive ROS otherwise negative.     Objective:    BP 136/75 (BP Location: Left Arm, Patient  Position: Sitting, Cuff Size: Normal)   Pulse 85   Ht 5\' 2"  (1.575 m)   Wt 189 lb 4.8 oz (85.9 kg)   LMP 01/21/2016   BMI 34.62 kg/m  General: alert and no acute distress    Lab Review Urine HCG: positive    Assessment:    Absence of menstruation.    H/o asthma H/o migraines H/o PCOS H/o benign persistent tachycardia Obesity H/o IBS  Plan:   - Pregnancy Test: Positive: EDC: 10/28/2015, 7.1 weeks. Briefly discussed pre-natal care options. Pregnancy, Childbirth and the Newborn book given. Encouraged well-balanced diet, plenty of rest when needed, pre-natal vitamins daily and walking for exercise. Discussed self-help for nausea, avoiding OTC medications until consulting provider or pharmacist, other than Tylenol as needed, minimal caffeine (1-2 cups daily) and avoiding alcohol. She will schedule her initial OB visit in the next month with her PCP or OB provider. Feel free to call with any questions. Will have patient scheduled for ultrasound to confirm dates. RTC in 3 weeks for NOB intake.  - H/o asthma, patient denies any recent asthma attacks.  Has discontinued Advair, but can continue medication if needed during the pregnancy. Can also use albuterol inhaler.  - H/o migraines, currently taking Sumatriptan, however has not taken since beginning of pregnancy. Advised on discontinuation until end of the pregnancy. Discussed that about 1/3 of patients migraines improve while 1/3 get better and 1/3 get worse. Can prescribe Fioricet if migraines return. Also switched from Zanaflex to Flexeril (initially prescribed to help migraines by Neurologist).  - H/o PCOS - patient without h/o metabolic syndrome, did not have difficulties becoming pregnancy.  Will f/u after pregnancy.  -  Obesity, patient will need to monitor weight during the pregnancy, will also need early glucola.  - Ho tachycardia - patient currently on Metoprolol.  Currently is a Category C medication. Discussed risks of potential  decreasing fetal heart rate.   Most medications in this class are Category C.  Discussed using lowest possible dose, and [facility] also [facility] for further medication management (notes that she does not have a Cardiologist, was started on it by Neurologist).  - Patient currently on Nexium for h/o IBS.  Not FDA categorized but caution advised during pregnancy (possible risk of teratogenicity based on conflicting data).  Discussed switching to a different PPI, patient notes she has tried several others in the past but did not work as well. Did try Omeprazole when she was younger, which worked, but patient became "used to it" and had to be switched as it was less effective.  Advised to use lowest dose possible until after referral.    A total of 30 minutes were spent face-to-face with the patient during this encounter and over half of that time dealt with counseling and coordination of care.

## 2016-03-11 NOTE — Patient Instructions (Signed)
First Trimester of Pregnancy The first trimester of pregnancy is from week 1 until the end of week 12 (months 1 through 3). A week after a sperm fertilizes an egg, the egg will implant on the wall of the uterus. This embryo will begin to develop into a baby. Genes from you and your partner are forming the baby. The female genes determine whether the baby is a boy or a girl. At 6-8 weeks, the eyes and face are formed, and the heartbeat can be seen on ultrasound. At the end of 12 weeks, all the baby's organs are formed.  Now that you are pregnant, you will want to do everything you can to have a healthy baby. Two of the most important things are to get good prenatal care and to follow your health care provider's instructions. Prenatal care is all the medical care you receive before the baby's birth. This care will help prevent, find, and treat any problems during the pregnancy and childbirth. BODY CHANGES Your body goes through many changes during pregnancy. The changes vary from woman to woman.   You may gain or lose a couple of pounds at first.  You may feel sick to your stomach (nauseous) and throw up (vomit). If the vomiting is uncontrollable, call your health care provider.  You may tire easily.  You may develop headaches that can be relieved by medicines approved by your health care provider.  You may urinate more often. Painful urination may mean you have a bladder infection.  You may develop heartburn as a result of your pregnancy.  You may develop constipation because certain hormones are causing the muscles that push waste through your intestines to slow down.  You may develop hemorrhoids or swollen, bulging veins (varicose veins).  Your breasts may begin to grow larger and become tender. Your nipples may stick out more, and the tissue that surrounds them (areola) may become darker.  Your gums may bleed and may be sensitive to brushing and flossing.  Dark spots or blotches (chloasma,  mask of pregnancy) may develop on your face. This will likely fade after the baby is born.  Your menstrual periods will stop.  You may have a loss of appetite.  You may develop cravings for certain kinds of food.  You may have changes in your emotions from day to day, such as being excited to be pregnant or being concerned that something may go wrong with the pregnancy and baby.  You may have more vivid and strange dreams.  You may have changes in your hair. These can include thickening of your hair, rapid growth, and changes in texture. Some women also have hair loss during or after pregnancy, or hair that feels dry or thin. Your hair will most likely return to normal after your baby is born. WHAT TO EXPECT AT YOUR PRENATAL VISITS During a routine prenatal visit:  You will be weighed to make sure you and the baby are growing normally.  Your blood pressure will be taken.  Your abdomen will be measured to track your baby's growth.  The fetal heartbeat will be listened to starting around week 10 or 12 of your pregnancy.  Test results from any previous visits will be discussed. Your health care provider may ask you:  How you are feeling.  If you are feeling the baby move.  If you have had any abnormal symptoms, such as leaking fluid, bleeding, severe headaches, or abdominal cramping.  If you are using any tobacco products,   including cigarettes, chewing tobacco, and electronic cigarettes.  If you have any questions. Other tests that may be performed during your first trimester include:  Blood tests to find your blood type and to check for the presence of any previous infections. They will also be used to check for low iron levels (anemia) and Rh antibodies. Later in the pregnancy, blood tests for diabetes will be done along with other tests if problems develop.  Urine tests to check for infections, diabetes, or protein in the urine.  An ultrasound to confirm the proper growth  and development of the baby.  An amniocentesis to check for possible genetic problems.  Fetal screens for spina bifida and Down syndrome.  You may need other tests to make sure you and the baby are doing well.  HIV (human immunodeficiency virus) testing. Routine prenatal testing includes screening for HIV, unless you choose not to have this test. HOME CARE INSTRUCTIONS  Medicines  Follow your health care provider's instructions regarding medicine use. Specific medicines may be either safe or unsafe to take during pregnancy.  Take your prenatal vitamins as directed.  If you develop constipation, try taking a stool softener if your health care provider approves. Diet  Eat regular, well-balanced meals. Choose a variety of foods, such as meat or vegetable-based protein, fish, milk and low-fat dairy products, vegetables, fruits, and whole grain breads and cereals. Your health care provider will help you determine the amount of weight gain that is right for you.  Avoid raw meat and uncooked cheese. These carry germs that can cause birth defects in the baby.  Eating four or five small meals rather than three large meals a day may help relieve nausea and vomiting. If you start to feel nauseous, eating a few soda crackers can be helpful. Drinking liquids between meals instead of during meals also seems to help nausea and vomiting.  If you develop constipation, eat more high-fiber foods, such as fresh vegetables or fruit and whole grains. Drink enough fluids to keep your urine clear or pale yellow. Activity and Exercise  Exercise only as directed by your health care provider. Exercising will help you:  Control your weight.  Stay in shape.  Be prepared for labor and delivery.  Experiencing pain or cramping in the lower abdomen or low back is a good sign that you should stop exercising. Check with your health care provider before continuing normal exercises.  Try to avoid standing for long  periods of time. Move your legs often if you must stand in one place for a long time.  Avoid heavy lifting.  Wear low-heeled shoes, and practice good posture.  You may continue to have sex unless your health care provider directs you otherwise. Relief of Pain or Discomfort  Wear a good support bra for breast tenderness.   Take warm sitz baths to soothe any pain or discomfort caused by hemorrhoids. Use hemorrhoid cream if your health care provider approves.   Rest with your legs elevated if you have leg cramps or low back pain.  If you develop varicose veins in your legs, wear support hose. Elevate your feet for 15 minutes, 3-4 times a day. Limit salt in your diet. Prenatal Care  Schedule your prenatal visits by the twelfth week of pregnancy. They are usually scheduled monthly at first, then more often in the last 2 months before delivery.  Write down your questions. Take them to your prenatal visits.  Keep all your prenatal visits as directed by your   health care provider. Safety  Wear your seat belt at all times when driving.  Make a list of emergency phone numbers, including numbers for family, friends, the hospital, and police and fire departments. General Tips  Ask your health care provider for a referral to a local prenatal education class. Begin classes no later than at the beginning of month 6 of your pregnancy.  Ask for help if you have counseling or nutritional needs during pregnancy. Your health care provider can offer advice or refer you to specialists for help with various needs.  Do not use hot tubs, steam rooms, or saunas.  Do not douche or use tampons or scented sanitary pads.  Do not cross your legs for long periods of time.  Avoid cat litter boxes and soil used by cats. These carry germs that can cause birth defects in the baby and possibly loss of the fetus by miscarriage or stillbirth.  Avoid all smoking, herbs, alcohol, and medicines not prescribed by  your health care provider. Chemicals in these affect the formation and growth of the baby.  Do not use any tobacco products, including cigarettes, chewing tobacco, and electronic cigarettes. If you need help quitting, ask your health care provider. You may receive counseling support and other resources to help you quit.  Schedule a dentist appointment. At home, brush your teeth with a soft toothbrush and be gentle when you floss. SEEK MEDICAL CARE IF:   You have dizziness.  You have mild pelvic cramps, pelvic pressure, or nagging pain in the abdominal area.  You have persistent nausea, vomiting, or diarrhea.  You have a bad smelling vaginal discharge.  You have pain with urination.  You notice increased swelling in your face, hands, legs, or ankles. SEEK IMMEDIATE MEDICAL CARE IF:   You have a fever.  You are leaking fluid from your vagina.  You have spotting or bleeding from your vagina.  You have severe abdominal cramping or pain.  You have rapid weight gain or loss.  You vomit blood or material that looks like coffee grounds.  You are exposed to German measles and have never had them.  You are exposed to fifth disease or chickenpox.  You develop a severe headache.  You have shortness of breath.  You have any kind of trauma, such as from a fall or a car accident.   This information is not intended to replace advice given to you by your health care provider. Make sure you discuss any questions you have with your health care provider.   Document Released: 04/13/2001 Document Revised: 05/10/2014 Document Reviewed: 02/27/2013 [employer] Interactive Patient Education 2016 [employer]  

## 2016-03-12 ENCOUNTER — Encounter: Payer: Self-pay | Admitting: Obstetrics and Gynecology

## 2016-03-15 ENCOUNTER — Encounter: Payer: Self-pay | Admitting: Obstetrics and Gynecology

## 2016-03-15 DIAGNOSIS — E669 Obesity, unspecified: Secondary | ICD-10-CM | POA: Insufficient documentation

## 2016-03-15 DIAGNOSIS — J45909 Unspecified asthma, uncomplicated: Secondary | ICD-10-CM

## 2016-03-15 DIAGNOSIS — E282 Polycystic ovarian syndrome: Secondary | ICD-10-CM | POA: Insufficient documentation

## 2016-03-15 DIAGNOSIS — R000 Tachycardia, unspecified: Secondary | ICD-10-CM | POA: Insufficient documentation

## 2016-03-15 DIAGNOSIS — J4520 Mild intermittent asthma, uncomplicated: Secondary | ICD-10-CM | POA: Insufficient documentation

## 2016-03-15 HISTORY — DX: Unspecified asthma, uncomplicated: J45.909

## 2016-03-15 HISTORY — DX: Polycystic ovarian syndrome: E28.2

## 2016-03-22 ENCOUNTER — Ambulatory Visit
Admission: RE | Admit: 2016-03-22 | Discharge: 2016-03-22 | Disposition: A | Discharge status: Home / Self Care | Payer: BLUE CROSS/BLUE SHIELD | Source: Ambulatory Visit | Attending: Maternal & Fetal Medicine | Admitting: Maternal & Fetal Medicine

## 2016-03-22 ENCOUNTER — Encounter: Payer: Self-pay | Admitting: *Deleted

## 2016-03-22 VITALS — BP 120/71 | HR 83 | Temp 98.0°F | Resp 17 | Ht 62.0 in | Wt 188.0 lb

## 2016-03-22 DIAGNOSIS — O99419 Diseases of the circulatory system complicating pregnancy, unspecified trimester: Secondary | ICD-10-CM

## 2016-03-22 DIAGNOSIS — G43709 Chronic migraine without aura, not intractable, without status migrainosus: Secondary | ICD-10-CM | POA: Diagnosis not present

## 2016-03-22 DIAGNOSIS — J45909 Unspecified asthma, uncomplicated: Secondary | ICD-10-CM | POA: Diagnosis not present

## 2016-03-22 DIAGNOSIS — I519 Heart disease, unspecified: Secondary | ICD-10-CM
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
## 2016-03-24 ENCOUNTER — Ambulatory Visit (INDEPENDENT_AMBULATORY_CARE_PROVIDER_SITE_OTHER): Payer: BLUE CROSS/BLUE SHIELD

## 2016-03-24 ENCOUNTER — Ambulatory Visit (INDEPENDENT_AMBULATORY_CARE_PROVIDER_SITE_OTHER): Payer: BLUE CROSS/BLUE SHIELD | Admitting: Obstetrics and Gynecology

## 2016-03-24 VITALS — BP 110/86 | HR 83 | Wt 187.2 lb

## 2016-03-24 DIAGNOSIS — O09891 Supervision of other high risk pregnancies, first trimester: Secondary | ICD-10-CM

## 2016-03-24 DIAGNOSIS — Z3401 Encounter for supervision of normal first pregnancy, first trimester: Secondary | ICD-10-CM

## 2016-03-24 DIAGNOSIS — Z3A01 Less than 8 weeks gestation of pregnancy: Secondary | ICD-10-CM | POA: Diagnosis not present

## 2016-03-24 DIAGNOSIS — Z1389 Encounter for screening for other disorder: Secondary | ICD-10-CM

## 2016-03-24 DIAGNOSIS — Z369 Encounter for antenatal screening, unspecified: Secondary | ICD-10-CM

## 2016-03-24 DIAGNOSIS — Z3201 Encounter for pregnancy test, result positive: Secondary | ICD-10-CM

## 2016-03-24 DIAGNOSIS — Z113 Encounter for screening for infections with a predominantly sexual mode of transmission: Secondary | ICD-10-CM

## 2016-03-24 DIAGNOSIS — E669 Obesity, unspecified: Secondary | ICD-10-CM

## 2016-03-24 NOTE — Patient Instructions (Signed)
Pregnancy and Zika Virus Disease Introduction Zika virus disease, or Zika, is an illness that can spread to people from mosquitoes that carry the virus. It may also spread from person to person through infected body fluids. Zika first occurred in [place], but recently it has spread to new areas. The virus occurs in tropical climates. The location of Zika continues to change. Most people who become infected with Zika virus do not develop serious illness. However, Zika may cause birth defects in an unborn baby whose mother is infected with the virus. It may also increase the risk of miscarriage. What are the symptoms of Zika virus disease? In many cases, people who have been infected with Zika virus do not develop any symptoms. If symptoms appear, they usually start about a week after the person is infected. Symptoms are usually mild. They may include:  Fever.  Rash.  Red eyes.  Joint pain. How does Zika virus disease spread? The main way that Zika virus spreads is through the bite of a certain type of mosquito. Unlike most types of mosquitos, which bite only at night, the type of mosquito that carries Zika virus bites both at night and during the day. Zika virus can also spread through sexual contact, through a blood transfusion, and from a mother to her baby before or during birth. Once you have had Zika virus disease, it is unlikely that you will get it again. Can I pass Zika to my baby during pregnancy? Yes, Zika can pass from a mother to her baby before or during birth. What problems can Zika cause for my baby? A woman who is infected with Zika virus while pregnant is at risk of having her baby born with a condition in which the brain or head is smaller than expected (microcephaly). Babies who have microcephaly can have developmental delays, seizures, hearing problems, and vision problems. Having Zika virus disease during pregnancy can also increase the risk of miscarriage. How can Zika  virus disease be prevented? There is no vaccine to prevent Zika. The best way to prevent the disease is to avoid infected mosquitoes and avoid exposure to body fluids that can spread the virus. Avoid any possible exposure to Zika by taking the following precautions. For women and their sex partners:  Avoid traveling to high-risk areas. The locations where Zika is being reported change often. To identify high-risk areas, check the CDC travel website: www.cdc.gov/zika/geo/index.html  If you or your sex partner must travel to a high-risk area, talk with a health care provider before and after traveling.  Take all precautions to avoid mosquito bites if you live in, or travel to, any of the high-risk areas. Insect repellents are safe to use during pregnancy.  Ask your health care provider when it is safe to have sexual contact. For women:  If you are pregnant or trying to become pregnant, avoid sexual contact with persons who may have been exposed to Zika virus, persons who have possible symptoms of Zika, or persons whose history you are unsure about. If you choose to have sexual contact with someone who may have been exposed to Zika virus, use condoms correctly during the entire duration of sexual activity, every time. Do not share sexual devices, as you may be exposed to body fluids.  Ask your health care provider about when it is safe to attempt pregnancy after a possible exposure to Zika virus. What steps should I take to avoid mosquito bites? Take these steps to avoid mosquito bites when you   are in a high-risk area:  Wear loose clothing that covers your arms and legs.  Limit your outdoor activities.  Do not open windows unless they have window screens.  Sleep under mosquito nets.  Use insect repellent. The best insect repellents have:  DEET, picaridin, oil of lemon eucalyptus (OLE), or IR3535 in them.  Higher amounts of an active ingredient in them.  Remember that insect repellents  are safe to use during pregnancy.  Do not use OLE on children who are younger than 3 years of age. Do not use insect repellent on babies who are younger than 2 months of age.  Cover your child's stroller with mosquito netting. Make sure the netting fits snugly and that any loose netting does not cover your child's mouth or nose. Do not use a blanket as a mosquito-protection cover.  Do not apply insect repellent underneath clothing.  If you are using sunscreen, apply the sunscreen before applying the insect repellent.  Treat clothing with permethrin. Do not apply permethrin directly to your skin. Follow label directions for safe use.  Get rid of standing water, where mosquitoes may reproduce. Standing water is often found in items such as buckets, bowls, animal food dishes, and flowerpots. When you return from traveling to any high-risk area, continue taking actions to protect yourself against mosquito bites for 3 weeks, even if you show no signs of illness. This will prevent spreading Zika virus to uninfected mosquitoes. What should I know about the sexual transmission of Zika? People can spread Zika to their sexual partners during vaginal, anal, or oral sex, or by sharing sexual devices. Many people with Zika do not develop symptoms, so a person could spread the disease without knowing that they are infected. The greatest risk is to women who are pregnant or who may become pregnant. Zika virus can live longer in semen than it can live in blood. Couples can prevent sexual transmission of the virus by:  Using condoms correctly during the entire duration of sexual activity, every time. This includes vaginal, anal, and oral sex.  Not sharing sexual devices. Sharing increases your risk of being exposed to body fluid from another person.  Avoiding all sexual activity until your health care provider says it is safe. Should I be tested for Zika virus? A sample of your blood can be tested for Zika  virus. A pregnant woman should be tested if she may have been exposed to the virus or if she has symptoms of Zika. She may also have additional tests done during her pregnancy, such ultrasound testing. Talk with your health care provider about which tests are recommended. This information is not intended to replace advice given to you by your health care provider. Make sure you discuss any questions you have with your health care provider. Document Released: 01/08/2015 Document Revised: 09/25/2015 Document Reviewed: 01/01/2015  2017 Elsevier Minor Illnesses and Medications in Pregnancy  Cold/Flu:  Sudafed for congestion- Robitussin (plain) for cough- Tylenol for discomfort.  Please follow the directions on the label.  Try not to take any more than needed.  OTC Saline nasal spray and air humidifier or cool-mist  Vaporizer to sooth nasal irritation and to loosen congestion.  It is also important to increase intake of non carbonated fluids, especially if you have a fever.  Constipation:  Colace-2 capsules at bedtime; Metamucil- follow directions on label; Senokot- 1 tablet at bedtime.  Any one of these medications can be used.  It is also very important to increase   fluids and fruits along with regular exercise.  If problem persists please call the office.  Diarrhea:  Kaopectate as directed on the label.  Eat a bland diet and increase fluids.  Avoid highly seasoned foods.  Headache:  Tylenol 1 or 2 tablets every 3-4 hours as needed  Indigestion:  Maalox, Mylanta, Tums or Rolaids- as directed on label.  Also try to eat small meals and avoid fatty, greasy or spicy foods.  Nausea with or without Vomiting:  Nausea in pregnancy is caused by increased levels of hormones in the body which influence the digestive system and cause irritation when stomach acids accumulate.  Symptoms usually subside after 1st trimester of pregnancy.  Try the following: 1. Keep saltines, graham crackers or dry toast by your bed to  eat upon awakening. 2. Don't let your stomach get empty.  Try to eat 5-6 small meals per day instead of 3 large ones. 3. Avoid greasy fatty or highly seasoned foods.  4. Take OTC Unisom 1 tablet at bed time along with OTC Vitamin B6 25-50 mg 3 times per day.    If nausea continues with vomiting and you are unable to keep down food and fluids you may need a prescription medication.  Please notify your provider.   Sore throat:  Chloraseptic spray, throat lozenges and or plain Tylenol.  Vaginal Yeast Infection:  OTC Monistat for 7 days as directed on label.  If symptoms do not resolve within a week notify provider.  If any of the above problems do not subside with recommended treatment please call the office for further assistance.   Do not take Aspirin, Advil, Motrin or Ibuprofen.  * * OTC= Over the counter Hyperemesis Gravidarum Hyperemesis gravidarum is a severe form of nausea and vomiting that happens during pregnancy. Hyperemesis is worse than morning sickness. It may cause you to have nausea or vomiting all day for many days. It may keep you from eating and drinking enough food and liquids. Hyperemesis usually occurs during the first half (the first 20 weeks) of pregnancy. It often goes away once a woman is in her second half of pregnancy. However, sometimes hyperemesis continues through an entire pregnancy. What are the causes? The cause of this condition is not known. It may be related to changes in chemicals (hormones) in the body during pregnancy, such as the high level of pregnancy hormone (human chorionic gonadotropin) or the increase in the female sex hormone (estrogen). What are the signs or symptoms? Symptoms of this condition include:  Severe nausea and vomiting.  Nausea that does not go away.  Vomiting that does not allow you to keep any food down.  Weight loss.  Body fluid loss (dehydration).  Having no desire to eat, or not liking food that you have previously  enjoyed. How is this diagnosed? This condition may be diagnosed based on:  A physical exam.  Your medical history.  Your symptoms.  Blood tests.  Urine tests. How is this treated? This condition may be managed with medicine. If medicines to do not help relieve nausea and vomiting, you may need to receive fluids through an IV tube at the hospital. Follow these instructions at home:  Take over-the-counter and prescription medicines only as told by your health care provider.  Avoid iron pills and multivitamins that contain iron for the first 3-4 months of pregnancy. If you take prescription iron pills, do not stop taking them unless your health care provider approves.  Take the following actions to help   prevent nausea and vomiting:  In the morning, before getting out of bed, try eating a couple of dry crackers or a piece of toast.  Avoid foods and smells that upset your stomach. Fatty and spicy foods may make nausea worse.  Eat 5-6 small meals a day.  Do not drink fluids while eating meals. Drink between meals.  Eat or suck on things that have ginger in them. Ginger can help relieve nausea.  Avoid food preparation. The smell of food can spoil your appetite or trigger nausea.  Follow instructions from your health care provider about eating or drinking restrictions.  For snacks, eat high-protein foods, such as cheese.  Keep all follow-up and pre-birth (prenatal) visits as told by your health care provider. This is important. Contact a health care provider if:  You have pain in your abdomen.  You have a severe headache.  You have vision problems.  You are losing weight. Get help right away if:  You cannot drink fluids without vomiting.  You vomit blood.  You have constant nausea and vomiting.  You are very weak.  You are very thirsty.  You feel dizzy.  You faint.  You have a fever or other symptoms that last for more than 2-3 days.  You have a fever and  your symptoms suddenly get worse. Summary  Hyperemesis gravidarum is a severe form of nausea and vomiting that happens during pregnancy.  Making some changes to your eating habits may help relieve nausea and vomiting.  This condition may be managed with medicine.  If medicines to do not help relieve nausea and vomiting, you may need to receive fluids through an IV tube at the hospital. This information is not intended to replace advice given to you by your health care provider. Make sure you discuss any questions you have with your health care provider. Document Released: 04/19/2005 Document Revised: 12/17/2015 Document Reviewed: 12/17/2015 Elsevier Interactive Patient Education  2017 Else[employer] Commonly Asked Questions During Pregnancy  Cats: A parasite can be excreted in cat feces.  To avoid exposure you need to have another person empty the little box.  If you must empty the litter box you will need to wear gloves.  Wash your hands after handling your cat.  This parasite can also be found in raw or undercooked meat so this should also be avoided.  Colds, Sore Throats, Flu: Please check your medication sheet to see what you can take for symptoms.  If your symptoms are unrelieved by these medications please call the office.  Dental Work: Most any dental work your dentist recommends is permitted.  X-rays should only be taken during the first trimester if absolutely necessary.  Your abdomen should be shielded with a lead apron during all x-rays.  Please notify your provider prior to receiving any x-rays.  Novocaine is fine; gas is not recommended.  If your dentist requires a note from us prior to dental work please call the office and we will provide one for you.  Exercise: Exercise is an important part of staying healthy during your pregnancy.  You may continue most exercises you were accustomed to prior to pregnancy.  Later in your pregnancy you will most likely notice you have difficulty  with activities requiring balance like riding a bicycle.  It is important that you listen to your body and avoid activities that put you at a higher risk of falling.  Adequate rest and staying well hydrated are a must!  If you have questions   about the safety of specific activities ask your provider.    Exposure to Children with illness: Try to avoid obvious exposure; report any symptoms to us when noted,  If you have chicken pos, red measles or mumps, you should be immune to these diseases.   Please do not take any vaccines while pregnant unless you have checked with your OB provider.  Fetal Movement: After 28 weeks we recommend you do "kick counts" twice daily.  Lie or sit down in a calm quiet environment and count your baby movements "kicks".  You should feel your baby at least 10 times per hour.  If you have not felt 10 kicks within the first hour get up, walk around and have something sweet to eat or drink then repeat for an additional hour.  If count remains less than 10 per hour notify your provider.  Fumigating: Follow your pest control agent's advice as to how long to stay out of your home.  Ventilate the area well before re-entering.  Hemorrhoids:   Most over-the-counter preparations can be used during pregnancy.  Check your medication to see what is safe to use.  It is important to use a stool softener or fiber in your diet and to drink lots of liquids.  If hemorrhoids seem to be getting worse please call the office.   Hot Tubs:  Hot tubs Jacuzzis and saunas are not recommended while pregnant.  These increase your internal body temperature and should be avoided.  Intercourse:  Sexual intercourse is safe during pregnancy as long as you are comfortable, unless otherwise advised by your provider.  Spotting may occur after intercourse; report any bright red bleeding that is heavier than spotting.  Labor:  If you know that you are in labor, please go to the hospital.  If you are unsure, please  call the office and let us help you decide what to do.  Lifting, straining, etc:  If your job requires heavy lifting or straining please check with your provider for any limitations.  Generally, you should not lift items heavier than that you can lift simply with your hands and arms (no back muscles)  Painting:  Paint fumes do not harm your pregnancy, but may make you ill and should be avoided if possible.  Latex or water based paints have less odor than oils.  Use adequate ventilation while painting.  Permanents & Hair Color:  Chemicals in hair dyes are not recommended as they cause increase hair dryness which can increase hair loss during pregnancy.  " Highlighting" and permanents are allowed.  Dye may be absorbed differently and permanents may not hold as well during pregnancy.  Sunbathing:  Use a sunscreen, as skin burns easily during pregnancy.  Drink plenty of fluids; avoid over heating.  Tanning Beds:  Because their possible side effects are still unknown, tanning beds are not recommended.  Ultrasound Scans:  Routine ultrasounds are performed at approximately 20 weeks.  You will be able to see your baby's general anatomy an if you would like to know the gender this can usually be determined as well.  If it is questionable when you conceived you may also receive an ultrasound early in your pregnancy for dating purposes.  Otherwise ultrasound exams are not routinely performed unless there is a medical necessity.  Although you can request a scan we ask that you pay for it when conducted because insurance does not cover " patient request" scans.  Work: If your pregnancy proceeds without complications you   may work until your due date, unless your physician or employer advises otherwise.  Round Ligament Pain/Pelvic Discomfort:  Sharp, shooting pains not associated with bleeding are fairly common, usually occurring in the second trimester of pregnancy.  They tend to be worse when standing up or when  you remain standing for long periods of time.  These are the result of pressure of certain pelvic ligaments called "round ligaments".  Rest, Tylenol and heat seem to be the most effective relief.  As the womb and fetus grow, they rise out of the pelvis and the discomfort improves.  Please notify the office if your pain seems different than that described.  It may represent a more serious condition.   

## 2016-03-24 NOTE — Progress Notes (Signed)
Jillian Anderson presents for NOB nurse interview visit. Pregnancy confirmation done on 03/11/2016, seen by Dr. Valentino Saxonherry.  G-1.  P-0000. Pregnancy education material explained and given. No cats in the home. NOB labs ordered. TSH/HbgA1c due to Increased BMI, HIV labs and Drug screen were explained optional and she did not decline. Drug screen ordered.  PNV encouraged. Genetic screening, to discuss with provider. Ultrasound for dating and viability done today. EDD: 11/09/2016, EGA: 7.1 weeks.  Pt EDD changed. Pt. To follow up with provider in _5_ weeks for NOB physical.  All questions answered.

## 2016-03-25 LAB — VARICELLA ZOSTER ANTIBODY, IGG: Varicella zoster IgG: 605 index (ref >165)

## 2016-03-25 LAB — CBC WITH DIFFERENTIAL/PLATELET
BASOS: 0 %
Basophils Absolute: 0 10*3/uL (ref 0.0–0.2)
EOS (ABSOLUTE): 0 10*3/uL (ref 0.0–0.4)
EOS: 0 %
HEMATOCRIT: 38.2 % (ref 34.0–46.6)
HEMOGLOBIN: 12.5 g/dL (ref 11.1–15.9)
IMMATURE GRANS (ABS): 0 10*3/uL (ref 0.0–0.1)
IMMATURE GRANULOCYTES: 0 %
LYMPHS: 43 %
Lymphocytes Absolute: 2.8 10*3/uL (ref 0.7–3.1)
MCH: 29.1 pg (ref 26.6–33.0)
MCHC: 32.7 g/dL (ref 31.5–35.7)
MCV: 89 fL (ref 79–97)
Monocytes Absolute: 0.4 10*3/uL (ref 0.1–0.9)
Monocytes: 6 %
NEUTROS ABS: 3.2 10*3/uL (ref 1.4–7.0)
NEUTROS PCT: 51 %
PLATELETS: 240 10*3/uL (ref 150–379)
RBC: 4.3 x10E6/uL (ref 3.77–5.28)
RDW: 14.3 % (ref 12.3–15.4)
WBC: 6.5 10*3/uL (ref 3.4–10.8)

## 2016-03-25 LAB — RUBELLA SCREEN: RUBELLA: 6.21 {index} (ref >0.99)

## 2016-03-25 LAB — RPR: RPR: NONREACTIVE

## 2016-03-25 LAB — ANTIBODY SCREEN: Antibody Screen: NEGATIVE

## 2016-03-25 LAB — RH TYPE: RH TYPE: POSITIVE

## 2016-03-25 LAB — HEMOGLOBIN A1C
Est. average glucose Bld gHb Est-mCnc: 97 mg/dL
Hgb A1c MFr Bld: 5 % (ref 4.8–5.6)

## 2016-03-25 LAB — SICKLE CELL SCREEN: Sickle Cell Screen: NEGATIVE

## 2016-03-25 LAB — TSH: TSH: 2.05 u[IU]/mL (ref 0.450–4.500)

## 2016-03-25 LAB — ABO

## 2016-03-25 LAB — HIV ANTIBODY (ROUTINE TESTING W REFLEX): HIV SCREEN 4TH GENERATION: NONREACTIVE

## 2016-03-25 LAB — HEPATITIS B SURFACE ANTIGEN: HEP B S AG: NEGATIVE

## 2016-03-26 LAB — MONITOR DRUG PROFILE 14(MW)
AMPHETAMINE SCREEN URINE: NEGATIVE ng/mL
BARBITURATE SCREEN URINE: NEGATIVE ng/mL
BENZODIAZEPINE SCREEN, URINE: NEGATIVE ng/mL
Buprenorphine, Urine: NEGATIVE ng/mL
CANNABINOIDS UR QL SCN: NEGATIVE ng/mL
Cocaine (Metab) Scrn, Ur: NEGATIVE ng/mL
Creatinine(Crt), U: 120 mg/dL (ref 20.0–300.0)
FENTANYL, URINE: NEGATIVE pg/mL
MEPERIDINE SCREEN, URINE: NEGATIVE ng/mL
Methadone Screen, Urine: NEGATIVE ng/mL
OPIATE SCREEN URINE: NEGATIVE ng/mL
OXYCODONE+OXYMORPHONE UR QL SCN: NEGATIVE ng/mL
PROPOXYPHENE SCREEN URINE: NEGATIVE ng/mL
Ph of Urine: 6.2 (ref 4.5–8.9)
Phencyclidine Qn, Ur: NEGATIVE ng/mL
SPECIFIC GRAVITY: 1.026
TRAMADOL SCREEN, URINE: NEGATIVE ng/mL

## 2016-03-26 LAB — CULTURE, OB URINE

## 2016-03-26 LAB — URINALYSIS, ROUTINE W REFLEX MICROSCOPIC
Bilirubin, UA: NEGATIVE
Glucose, UA: NEGATIVE
KETONES UA: NEGATIVE
LEUKOCYTES UA: NEGATIVE
Nitrite, UA: NEGATIVE
PH UA: 7.5 (ref 5.0–7.5)
PROTEIN UA: NEGATIVE
RBC, UA: NEGATIVE
SPEC GRAV UA: 1.019 (ref 1.005–1.030)
Urobilinogen, Ur: 0.2 mg/dL (ref 0.2–1.0)

## 2016-03-26 LAB — URINE CULTURE, OB REFLEX

## 2016-03-26 LAB — NICOTINE SCREEN, URINE: Cotinine Ql Scrn, Ur: NEGATIVE ng/mL

## 2016-03-27 ENCOUNTER — Other Ambulatory Visit: Payer: Self-pay | Admitting: Obstetrics and Gynecology

## 2016-03-27 LAB — GC/CHLAMYDIA PROBE AMP
Chlamydia trachomatis, NAA: NEGATIVE
NEISSERIA GONORRHOEAE BY PCR: NEGATIVE

## 2016-03-27 MED ORDER — NITROFURANTOIN MONOHYD MACRO 100 MG PO CAPS: 100.0000 mg | ORAL_CAPSULE | Freq: Two times a day (BID) | ORAL | 1 refills | Status: DC

## 2016-04-16 ENCOUNTER — Telehealth: Payer: Self-pay | Admitting: Obstetrics and Gynecology

## 2016-04-16 ENCOUNTER — Ambulatory Visit (INDEPENDENT_AMBULATORY_CARE_PROVIDER_SITE_OTHER): Payer: BLUE CROSS/BLUE SHIELD | Admitting: Obstetrics and Gynecology

## 2016-04-16 VITALS — BP 127/84 | HR 85 | Wt 185.0 lb

## 2016-04-16 DIAGNOSIS — Z3481 Encounter for supervision of other normal pregnancy, first trimester: Secondary | ICD-10-CM

## 2016-04-16 DIAGNOSIS — H6691 Otitis media, unspecified, right ear: Secondary | ICD-10-CM

## 2016-04-16 LAB — POCT URINALYSIS DIPSTICK
GLUCOSE UA: NEGATIVE
Ketones, UA: NEGATIVE
Leukocytes, UA: NEGATIVE
Nitrite, UA: NEGATIVE
RBC UA: NEGATIVE
SPEC GRAV UA: 1.025
Urobilinogen, UA: 0.2
pH, UA: 6

## 2016-04-16 MED ORDER — AMOXICILLIN-POT CLAVULANATE 875-125 MG PO TABS
1.0000 | ORAL_TABLET | Freq: Two times a day (BID) | ORAL | 1 refills | Status: DC
Start: 2016-04-16 — End: 2016-05-11

## 2016-04-16 NOTE — Progress Notes (Signed)
OB WORK IN- pt is c/o R ear pain, she is having a lot of drainage, cant take sudafed due to heart palpations

## 2016-04-16 NOTE — Progress Notes (Signed)
Work in [employer]-reports gradual onset right ear pain for 4 days that now in in her sinuses and head-h/o sinus infections in past; right ear with tympanic membrane bulging and green fluid noted . Sinuses clear. To use xyzal and saline nasal spray, vicks vapor rub, antibiotic sent in.

## 2016-04-16 NOTE — Telephone Encounter (Signed)
Called pt appt made for 04/16/16

## 2016-04-16 NOTE — Telephone Encounter (Signed)
Pt called and she had called earlier this week and she is not feeling good, she is [redacted] weeks pregnant and who ever she spoke with earlier this week told her that if she is not feeling any better to call us back and probably needed to be seen, so pt called back this morning and she is not feeling better and her ears are very sore.

## 2016-04-19 ENCOUNTER — Other Ambulatory Visit: Payer: BLUE CROSS/BLUE SHIELD

## 2016-04-19 ENCOUNTER — Encounter: Payer: Self-pay | Admitting: Obstetrics and Gynecology

## 2016-04-19 DIAGNOSIS — O4690 Antepartum hemorrhage, unspecified, unspecified trimester: Secondary | ICD-10-CM

## 2016-04-20 ENCOUNTER — Other Ambulatory Visit: Payer: Self-pay | Admitting: Obstetrics and Gynecology

## 2016-04-20 ENCOUNTER — Telehealth: Payer: Self-pay | Admitting: Obstetrics and Gynecology

## 2016-04-20 DIAGNOSIS — N926 Irregular menstruation, unspecified: Secondary | ICD-10-CM

## 2016-04-20 LAB — BETA HCG QUANT (REF LAB): HCG QUANT: 5474 m[IU]/mL

## 2016-04-20 NOTE — Telephone Encounter (Signed)
pls advise

## 2016-04-20 NOTE — Telephone Encounter (Signed)
Patient called requesting beta results.Thanks °

## 2016-04-21 ENCOUNTER — Other Ambulatory Visit: Payer: Self-pay | Admitting: Obstetrics and Gynecology

## 2016-04-21 DIAGNOSIS — O4690 Antepartum hemorrhage, unspecified, unspecified trimester: Secondary | ICD-10-CM

## 2016-04-22 ENCOUNTER — Ambulatory Visit (INDEPENDENT_AMBULATORY_CARE_PROVIDER_SITE_OTHER): Payer: BLUE CROSS/BLUE SHIELD | Admitting: Obstetrics and Gynecology

## 2016-04-22 ENCOUNTER — Other Ambulatory Visit: Payer: Self-pay | Admitting: Obstetrics and Gynecology

## 2016-04-22 ENCOUNTER — Ambulatory Visit (INDEPENDENT_AMBULATORY_CARE_PROVIDER_SITE_OTHER): Payer: BLUE CROSS/BLUE SHIELD

## 2016-04-22 ENCOUNTER — Encounter
Admission: RE | Admit: 2016-04-22 | Discharge: 2016-04-22 | Disposition: A | Discharge status: Home / Self Care | Payer: BLUE CROSS/BLUE SHIELD | Source: Ambulatory Visit | Attending: Obstetrics and Gynecology | Admitting: Obstetrics and Gynecology

## 2016-04-22 ENCOUNTER — Other Ambulatory Visit: Payer: Self-pay | Admitting: *Deleted

## 2016-04-22 DIAGNOSIS — J45909 Unspecified asthma, uncomplicated: Secondary | ICD-10-CM | POA: Diagnosis not present

## 2016-04-22 DIAGNOSIS — O4690 Antepartum hemorrhage, unspecified, unspecified trimester: Secondary | ICD-10-CM

## 2016-04-22 DIAGNOSIS — O021 Missed abortion: Secondary | ICD-10-CM | POA: Diagnosis not present

## 2016-04-22 DIAGNOSIS — E282 Polycystic ovarian syndrome: Secondary | ICD-10-CM | POA: Diagnosis not present

## 2016-04-22 HISTORY — DX: Gastro-esophageal reflux disease without esophagitis: K21.9

## 2016-04-22 HISTORY — DX: Personal history of urinary calculi: Z87.442

## 2016-04-22 LAB — CBC WITH DIFFERENTIAL/PLATELET
BASOS PCT: 0 %
Basophils Absolute: 0 10*3/uL (ref 0–0.1)
Eosinophils Absolute: 0 10*3/uL (ref 0–0.7)
Eosinophils Relative: 0 %
HEMATOCRIT: 37.9 % (ref 35.0–47.0)
HEMOGLOBIN: 12.9 g/dL (ref 12.0–16.0)
Lymphocytes Relative: 34 %
Lymphs Abs: 3.4 10*3/uL (ref 1.0–3.6)
MCH: 30.3 pg (ref 26.0–34.0)
MCHC: 34 g/dL (ref 32.0–36.0)
MCV: 89 fL (ref 80.0–100.0)
MONOS PCT: 6 %
Monocytes Absolute: 0.6 10*3/uL (ref 0.2–0.9)
NEUTROS ABS: 6.1 10*3/uL (ref 1.4–6.5)
NEUTROS PCT: 60 %
Platelets: 273 10*3/uL (ref 150–440)
RBC: 4.26 MIL/uL (ref 3.80–5.20)
RDW: 13 % (ref 11.5–14.5)
WBC: 10.2 10*3/uL (ref 3.6–11.0)

## 2016-04-22 LAB — TYPE AND SCREEN
ABO/RH(D): O POS
ANTIBODY SCREEN: NEGATIVE
Extend sample reason: UNDETERMINED

## 2016-04-22 LAB — RAPID HIV SCREEN (HIV 1/2 AB+AG)
HIV 1/2 ANTIBODIES: NONREACTIVE
HIV-1 P24 Antigen - HIV24: NONREACTIVE

## 2016-04-22 NOTE — H&P (Signed)
Subjective:  PREOPERATIVE HISTORY AND PHYSICAL    Date of surgery: 04/23/2016 Diagnosis: Embryonic demise-7 weeks Procedure: Suction D&C    Patient is a 30 y.o. G1P70female scheduled for suction D&C for management of embryonic demise at 7 weeks' gestation. She has experienced mild cramping and vaginal spotting without passage of tissue. Blood type is O+. Ultrasound on 04/22/2016 demonstrated an embryonic demise with crown rump length consistent with 7.4 weeks without fetal cardiac activity or fetal movement.  OB History    Gravida Para Term Preterm AB Living   1         0   SAB TAB Ectopic Multiple Live Births                  Patient's last menstrual period was 01/21/2016 (exact date).    Past Medical History:  Diagnosis Date  . Allergy   . Asthma without status asthmaticus 03/15/2016  . Astigmatism   . Migraines   . Polycystic disease, ovaries   . Tachycardia   . Tachycardia     Past Surgical History:  Procedure Laterality Date  . WISDOM TOOTH EXTRACTION      OB History  Gravida Para Term Preterm AB Living  1         0  SAB TAB Ectopic Multiple Live Births               # Outcome Date GA Lbr Len/2nd Weight Sex Delivery Anes PTL Lv  1 Current               Social History   Social History  . Marital status: Married    Spouse name: N/A  . Number of children: N/A  . Years of education: N/A   Social History Main Topics  . Smoking status: Never Smoker  . Smokeless tobacco: Never Used  . Alcohol use No     Comment: occassionally  . Drug use: No  . Sexual activity: Yes    Birth control/ protection: None   Other Topics Concern  . Not on file   Social History Narrative  . No narrative on file    Family History  Problem Relation Age of Onset  . Hypertension Mother   . Hyperlipidemia Father   . Diabetes Father   . Asthma Brother      (Not in a hospital admission)  Allergies  Allergen Reactions  . Tour managerBlackberry Flavor   . Ceclor [Cefaclor]    hives  . Codeine     Body fights itself eyes dilate  . Raspberry   . Red Dye     Hives, nausea and migraines     Review of Systems Constitutional: No recent fever/chills/sweats Respiratory: No recent cough/bronchitis Cardiovascular: No chest pain Gastrointestinal: No recent nausea/vomiting/diarrhea Genitourinary: No UTI symptoms Hematologic/lymphatic:No history of coagulopathy or recent blood thinner use    Objective:    LMP 01/21/2016 (Exact Date)   General:   Normal  Skin:   normal  HEENT:  Normal  Neck:  Supple without Adenopathy or Thyromegaly  Lungs:   Heart:              Breasts:   Abdomen:  Pelvis:  M/S   Extremeties:  Neuro:    clear to auscultation bilaterally   Normal without murmur   Not Examined   soft, non-tender; bowel sounds normal; no masses,  no organomegaly   Exam deferred to OR  No CVAT  Warm/Dry   Normal  Assessment:    1. Embryonic demise-[redacted] weeks gestation 2. O+ blood type   Plan:  Suction D&C   PREOP counseling: The patient is to undergo suction D&C for embryonic demise at 7.[redacted] weeks gestation on 04/23/2016. She is understanding of the planned procedure and is aware of and is accepting of all surgical risks which include but are not limited to bleeding, infection, pelvic organ injury with need for repair, blood clot disorders, anesthesia risks, etc. All questions have been answered. Informed consent is given. Patient is ready and willing to proceed with surgery as scheduled.  Herold HarmsMartin A Humberto Addo, MD  Note: This dictation was prepared with Dragon dictation along with smaller phrase technology. Any transcriptional errors that result from this process are unintentional.

## 2016-04-22 NOTE — Patient Instructions (Signed)
1. Patient is scheduled for suction D&C for 04/23/2016 2. Return in 3 weeks for postop check Dilation and Curettage or Vacuum Curettage Dilation and curettage (D&C) and vacuum curettage are minor procedures. A D&C involves stretching (dilation) the cervix and scraping (curettage) the inside lining of the uterus (endometrium). During a D&C, tissue is gently scraped from the endometrium, starting from the top portion of the uterus down to the lowest part of the uterus (cervix). During a vacuum curettage, the lining and tissue in the uterus are removed with the use of gentle suction. Curettage may be performed to either diagnose or treat a problem. As a diagnostic procedure, curettage is performed to examine tissues from the uterus. A diagnostic curettage may be done if you have:  Irregular bleeding in the uterus.  Bleeding with the development of clots.  Spotting between menstrual periods.  Prolonged menstrual periods or other abnormal bleeding.  Bleeding after menopause.  No menstrual period (amenorrhea).  A change in size and shape of the uterus.  Abnormal endometrial cells discovered during a Pap test. As a treatment procedure, curettage may be performed for the following reasons:  Removal of an IUD (intrauterine device).  Removal of retained placenta after giving birth.  Abortion.  Miscarriage.  Removal of endometrial polyps.  Removal of uncommon types of noncancerous lumps (fibroids). Tell a health care provider about:  Any allergies you have, including allergies to prescribed medicine or latex.  All medicines you are taking, including vitamins, herbs, eye drops, creams, and over-the-counter medicines. This is especially important if you take any blood-thinning medicine. Bring a list of all of your medicines to your appointment.  Any problems you or family members have had with anesthetic medicines.  Any blood disorders you have.  Any surgeries you have had.  Your  medical history and any medical conditions you have.  Whether you are pregnant or may be pregnant.  Recent vaginal infections you have had.  Recent menstrual periods, bleeding problems you have had, and what form of birth control (contraception) you use. What are the risks? Generally, this is a safe procedure. However, problems may occur, including:  Infection.  Heavy vaginal bleeding.  Allergic reactions to medicines.  Damage to the cervix or other structures or organs.  Development of scar tissue (adhesions) inside the uterus, which can cause abnormal amounts of menstrual bleeding. This may make it harder to get pregnant in the future.  A hole (perforation) or puncture in the uterine wall. This is rare. What happens before the procedure? Staying hydrated  Follow instructions from your health care provider about hydration, which may include:  Up to 2 hours before the procedure - you may continue to drink clear liquids, such as water, clear fruit juice, black coffee, and plain tea. Eating and drinking restrictions  Follow instructions from your health care provider about eating and drinking, which may include:  8 hours before the procedure - stop eating heavy meals or foods such as meat, fried foods, or fatty foods.  6 hours before the procedure - stop eating light meals or foods, such as toast or cereal.  6 hours before the procedure - stop drinking milk or drinks that contain milk.  2 hours before the procedure - stop drinking clear liquids. If your health care provider told you to take your medicine(s) on the day of your procedure, take them with only a sip of water. Medicines  Ask your health care provider about:  Changing or stopping your regular medicines. This  is especially important if you are taking diabetes medicines or blood thinners.  Taking medicines such as aspirin and ibuprofen. These medicines can thin your blood. Do not take these medicines before your  procedure if your health care provider instructs you not to.  You may be given antibiotic medicine to help prevent infection. General instructions  For 24 hours before your procedure, do not:  Douche.  Use tampons.  Use medicines, creams, or suppositories in the vagina.  Have sexual intercourse.  You may be given a pregnancy test on the day of the procedure.  Plan to have someone take you home from the hospital or clinic.  You may have a blood or urine sample taken.  If you will be going home right after the procedure, plan to have someone with you for 24 hours. What happens during the procedure?  To reduce your risk of infection:  Your health care team will wash or sanitize their hands.  Your skin will be washed with soap.  An IV tube will be inserted into one of your veins.  You will be given one of the following:  A medicine that numbs the area in and around the cervix (local anesthetic).  A medicine to make you fall asleep (general anesthetic).  You will lie down on your back, with your feet in foot rests (stirrups).  The size and position of your uterus will be checked.  A lubricated instrument (speculum or Sims retractor) will be inserted into the back side of your vagina. The speculum will be used to hold apart the walls of your vagina so your health care provider can see your cervix.  A tool (tenaculum) will be attached to the lip of the cervix to stabilize it.  Your cervix will be softened and dilated. This may be done by:  Taking a medicine.  Having tapered dilators or thin rods (laminaria) or gradual widening instruments (tapered dilators) inserted into your cervix.  A small, sharp, curved instrument (curette) will be used to scrape a small amount of tissue or cells from the endometrium or cervical canal. In some cases, gentle suction is applied with the curette. The curette will then be removed. The cells will be taken to a lab for testing. The  procedure may vary among health care providers and hospitals. What happens after the procedure?  You may have mild cramping, backache, pain, and light bleeding or spotting. You may pass small blood clots from your vagina.  You may have to wear compression stockings. These stockings help to prevent blood clots and reduce swelling in your legs.  Your blood pressure, heart rate, breathing rate, and blood oxygen level will be monitored until the medicines you were given have worn off. Summary  Dilation and curettage (D&C) involves stretching (dilation) the cervix and scraping (curettage) the inside lining of the uterus (endometrium).  After the procedure, you may have mild cramping, backache, pain, and light bleeding or spotting. You may pass small blood clots from your vagina.  Plan to have someone take you home from the hospital or clinic. This information is not intended to replace advice given to you by your health care provider. Make sure you discuss any questions you have with your health care provider. Document Released: 04/19/2005 Document Revised: 01/04/2016 Document Reviewed: 01/04/2016 Elsevier Interactive Patient Education  2017 [employer]

## 2016-04-22 NOTE — Patient Instructions (Signed)
  Your procedure is scheduled on: April 23, 2016 (Friday) Report to Same Day Surgery 2nd floor medical mall [facility](Medical Mall Entrance-take elevator on left to 2nd floor.  Check in with surgery information desk.) To find out your arrival time please call [phone] between 1PM - 3PM on April 22, 2016 (Thursday)  Remember: Instructions that are not followed completely may result in serious medical risk, up to and including death, or upon the discretion of your surgeon and anesthesiologist your surgery may need to be rescheduled.    _x___ 1. Do not eat food or drink liquids after midnight. No gum chewing or hard candies.     __x__ 2. No Alcohol for 24 hours before or after surgery.   __x__3. No Smoking for 24 prior to surgery.   ____  4. Bring all medications with you on the day of surgery if instructed.    __x__ 5. Notify your doctor if there is any change in your medical condition     (cold, fever, infections).     Do not wear jewelry, make-up, hairpins, clips or nail polish.  Do not wear lotions, powders, or perfumes. You may wear deodorant.  Do not shave 48 hours prior to surgery. Men may shave face and neck.  Do not bring valuables to the hospital.    [facility] is not responsible for any belongings or valuables.               Contacts, dentures or bridgework may not be worn into surgery.  Leave your suitcase in the car. After surgery it may be brought to your room.  For patients admitted to the hospital, discharge time is determined by your treatment team.   Patients discharged the day of surgery will not be allowed to drive home.  You will need someone to drive you home and stay with you the night of your procedure.    Please read over the following fact sheets that you were given:   [facility] Preparing for Surgery and or MRSA Information   _x___ Take these medicines the morning of surgery with A SIP OF WATER:    1. Metoprolol  2.  3.  4.  5.  6.  ____Fleets  enema or Magnesium Citrate as directed.   ___ Use CHG Soap or sage wipes as directed on instruction sheet   ____ Use inhalers on the day of surgery and bring to hospital day of surgery  ____ Stop metformin 2 days prior to surgery    ____ Take 1/2 of usual insulin dose the night before surgery and none on the morning of           surgery.   _x___ Stop Aspirin, Coumadin, Pllavix ,Eliquis, Effient, or Pradaxa  x__ Stop Anti-inflammatories such as Advil, Aleve, Ibuprofen, Motrin, Naproxen,          Naprosyn, Goodies powders or aspirin products. Ok to take Tylenol.   ____ Stop supplements until after surgery.    ____ Bring C-Pap to the hospital.

## 2016-04-22 NOTE — Progress Notes (Signed)
GYN ENCOUNTER NOTE  Subjective:       Jillian Anderson is a 30 y.o. G1P0 female is here for gynecologic evaluation of the following issues:  1.Embryonic demise  Patient presents for follow-up after ultrasound for threatened abortion. Patient was to be [redacted] weeks pregnant based on previous scan 4 weeks ago. Ultrasound today demonstrated a 7.4 week intrauterine pregnancy without fetal cardiac activity or fetal movement. Findings were consistent with embryonic demise. Patient has been experiencing pelvic cramping with vaginal spotting.  ULTRASOUND: Date of Service: 04/22/16  Indications:Threatened AB Findings:  Singleton intrauterine pregnancy is visualized with a CRL consistent with 7 4/[redacted] weeks gestation, giving an (U/S) EDD of 12/06/15. The (U/S) EDD is NOT consistent with the clinically established (LMP) EDD of 11/09/16.  FHR: no fetal heart beat detected. CRL measurement: 13.2 mm Yolk sac is not seen.  Right Ovary measures 3.4 x 2.4 x 1.9 cm. It is normal in appearance. Left Ovary measures 3.1 x 2.2 x 1.9 cm. It is normal appearance. There is evidence of a corpus luteal cyst in the Right Survey of the adnexa demonstrates no adnexal masses. There is no free peritoneal fluid in the cul de sac.  Impression: 1. 7 4/7 week NON-Viable Singleton Intrauterine pregnancy by U/S-EMBRYONIC DEMISE  2. (U/S) EDD is NOT consistent with Clinically established (LMP) EDD of 11/10/15.   Gynecologic History Patient's last menstrual period was 01/21/2016 (exact date). Contraception: none  Obstetric History OB History  Gravida Para Term Preterm AB Living  1         0  SAB TAB Ectopic Multiple Live Births               # Outcome Date GA Lbr Len/2nd Weight Sex Delivery Anes PTL Lv  1 Current               Past Medical History:  Diagnosis Date  . Allergy   . Asthma without status asthmaticus 03/15/2016  . Astigmatism   . Migraines   . Polycystic disease, ovaries   . Tachycardia    . Tachycardia     Past Surgical History:  Procedure Laterality Date  . WISDOM TOOTH EXTRACTION      Current Outpatient Prescriptions on File Prior to Visit  Medication Sig Dispense Refill  . amoxicillin-clavulanate (AUGMENTIN) 875-125 MG tablet Take 1 tablet by mouth 2 (two) times daily. 20 tablet 1  . cyclobenzaprine (FLEXERIL) 10 MG tablet TK 1 T PO TID PRF MUSCLE SPASMS  2  . levocetirizine (XYZAL) 5 MG tablet Take 1 tablet (5 mg total) by mouth daily. (Patient not taking: Reported on 04/16/2016) 90 tablet 3  . metoprolol tartrate (LOPRESSOR) 25 MG tablet Take 25 mg by mouth 2 (two) times daily.    . nitrofurantoin, macrocrystal-monohydrate, (MACROBID) 100 MG capsule Take 1 capsule (100 mg total) by mouth 2 (two) times daily. (Patient not taking: Reported on 04/16/2016) 14 capsule 1  . Prenat w/o A FeCbnFeGlu-FA &B6 (CITRANATAL B-CALM) 20-1 & 25 (2) MG MISC Take 20 mg by mouth daily. 30 each 11  . Probiotic Product (PROBIOTIC FORMULA) CAPS Take 2 capsules by mouth daily.    . promethazine (PHENERGAN) 25 MG tablet Take 1 tablet (25 mg total) by mouth every 6 (six) hours as needed. 30 tablet 0  . [DISCONTINUED] potassium chloride SA (K-DUR,KLOR-CON) 20 MEQ tablet Take 1 tablet (20 mEq total) by mouth daily. X 3 day, repeat labs next week 3 tablet 0   No current facility-administered medications on  file prior to visit.     Allergies  Allergen Reactions  . Tour managerBlackberry Flavor   . Ceclor [Cefaclor]     hives  . Codeine     Body fights itself eyes dilate  . Raspberry   . Red Dye     Hives, nausea and migraines     Social History   Social History  . Marital status: Married    Spouse name: N/A  . Number of children: N/A  . Years of education: N/A   Occupational History  . Not on file.   Social History Main Topics  . Smoking status: Never Smoker  . Smokeless tobacco: Never Used  . Alcohol use No     Comment: occassionally  . Drug use: No  . Sexual activity: Yes     Birth control/ protection: None   Other Topics Concern  . Not on file   Social History Narrative  . No narrative on file    Family History  Problem Relation Age of Onset  . Hypertension Mother   . Hyperlipidemia Father   . Diabetes Father   . Asthma Brother     The following portions of the patient's history were reviewed and updated as appropriate: allergies, current medications, past family history, past medical history, past social history, past surgical history and problem list.  Review of Systems Review of Systems - Positive for vaginal spotting, mild pelvic cramping; no fever chills or sweats. No UTI symptoms. No flank pain. No nausea or vomiting.  Objective:   LMP 01/21/2016 (Exact Date)  CONSTITUTIONAL: Well-developed, well-nourished female in no acute distress.  HENT:  Normocephalic, atraumatic. Oropharynx is clear NECK: Normal range of motion, supple, no masses.  Normal thyroid.  SKIN: Skin is warm and dry. No rash noted. Not diaphoretic. No erythema. No pallor. NEUROLGIC: Alert and oriented to person, place, and time. PSYCHIATRIC: Normal mood and affect. Normal behavior. Normal judgment and thought content. CARDIOVASCULAR: Regular rate and rhythm RESPIRATORY: Lungs clear BREASTS: Not Examined ABDOMEN: Soft, non distended; Non tender.  No Organomegaly. PELVIC: Deferred to surgery EXTREMITIES: Warm and dry   Assessment:   Embryonic demise-7 weeks   Plan:   Suction D&C-04/23/2016. Preoperative completed today. Options of management were reviewed including expectant observation, [facility] medical management, and suction D&C.  A total of 25 minutes were spent face-to-face with the patient during this encounter and over half of that time involved counseling and coordination of care.  Herold HarmsMartin A Saxon Barich, MD

## 2016-04-23 ENCOUNTER — Ambulatory Visit: Payer: BLUE CROSS/BLUE SHIELD | Admitting: Anesthesiology

## 2016-04-23 ENCOUNTER — Encounter: Payer: Self-pay | Admitting: *Deleted

## 2016-04-23 ENCOUNTER — Encounter
Admission: RE | Disposition: A | Discharge status: Home / Self Care | Payer: Self-pay | Source: Ambulatory Visit | Attending: Obstetrics and Gynecology

## 2016-04-23 ENCOUNTER — Ambulatory Visit
Admission: RE | Admit: 2016-04-23 | Discharge: 2016-04-23 | Disposition: A | Discharge status: Home / Self Care | Payer: BLUE CROSS/BLUE SHIELD | Source: Ambulatory Visit | Attending: Obstetrics and Gynecology | Admitting: Obstetrics and Gynecology

## 2016-04-23 DIAGNOSIS — O021 Missed abortion: Secondary | ICD-10-CM | POA: Diagnosis not present

## 2016-04-23 DIAGNOSIS — J45909 Unspecified asthma, uncomplicated: Secondary | ICD-10-CM | POA: Insufficient documentation

## 2016-04-23 DIAGNOSIS — E282 Polycystic ovarian syndrome: Secondary | ICD-10-CM | POA: Insufficient documentation

## 2016-04-23 DIAGNOSIS — Z9889 Other specified postprocedural states: Secondary | ICD-10-CM

## 2016-04-23 HISTORY — PX: DILATION AND EVACUATION: SHX1459

## 2016-04-23 LAB — ABO/RH: ABO/RH(D): O POS

## 2016-04-23 LAB — RPR: RPR Ser Ql: NONREACTIVE

## 2016-04-23 LAB — BETA HCG QUANT (REF LAB): HCG QUANT: 1747 m[IU]/mL

## 2016-04-23 SURGERY — DILATION AND EVACUATION, UTERUS
Anesthesia: General

## 2016-04-23 MED ORDER — GLYCOPYRROLATE 0.2 MG/ML IJ SOLN
INTRAMUSCULAR | Status: DC | PRN
  Administered 2016-04-23: 0.2 mg via INTRAVENOUS

## 2016-04-23 MED ORDER — LIDOCAINE HCL (CARDIAC) 20 MG/ML IV SOLN
INTRAVENOUS | Status: DC | PRN
  Administered 2016-04-23: 80 mg via INTRAVENOUS

## 2016-04-23 MED ORDER — FENTANYL CITRATE (PF) 100 MCG/2ML IJ SOLN
25.0000 ug | INTRAMUSCULAR | Status: DC | PRN
  Administered 2016-04-23 (×4): 25 ug via INTRAVENOUS

## 2016-04-23 MED ORDER — LACTATED RINGERS IV SOLN
INTRAVENOUS | Status: DC
  Administered 2016-04-23: 12:00:00 via INTRAVENOUS

## 2016-04-23 MED ORDER — FENTANYL CITRATE (PF) 100 MCG/2ML IJ SOLN
INTRAMUSCULAR | Status: AC
  Administered 2016-04-23: 25 ug via INTRAVENOUS
  Filled 2016-04-23: qty 2

## 2016-04-23 MED ORDER — DEXAMETHASONE SODIUM PHOSPHATE 10 MG/ML IJ SOLN
INTRAMUSCULAR | Status: DC | PRN
  Administered 2016-04-23: 4 mg via INTRAVENOUS

## 2016-04-23 MED ORDER — LIDOCAINE 2% (20 MG/ML) 5 ML SYRINGE
INTRAMUSCULAR | Status: AC
  Filled 2016-04-23: qty 5

## 2016-04-23 MED ORDER — DEXAMETHASONE SODIUM PHOSPHATE 10 MG/ML IJ SOLN
INTRAMUSCULAR | Status: AC
  Filled 2016-04-23: qty 1

## 2016-04-23 MED ORDER — MIDAZOLAM HCL 2 MG/2ML IJ SOLN
INTRAMUSCULAR | Status: AC
  Filled 2016-04-23: qty 2

## 2016-04-23 MED ORDER — FENTANYL CITRATE (PF) 100 MCG/2ML IJ SOLN
INTRAMUSCULAR | Status: DC | PRN
  Administered 2016-04-23: 25 ug via INTRAVENOUS
  Administered 2016-04-23: 75 ug via INTRAVENOUS
  Administered 2016-04-23: 50 ug via INTRAVENOUS

## 2016-04-23 MED ORDER — ONDANSETRON HCL 4 MG/2ML IJ SOLN
INTRAMUSCULAR | Status: AC
  Filled 2016-04-23: qty 2

## 2016-04-23 MED ORDER — ONDANSETRON HCL 4 MG/2ML IJ SOLN
INTRAMUSCULAR | Status: DC | PRN
  Administered 2016-04-23: 4 mg via INTRAVENOUS

## 2016-04-23 MED ORDER — FAMOTIDINE 20 MG PO TABS
ORAL_TABLET | ORAL | Status: AC
Start: 2016-04-23 — End: 2016-04-23
  Administered 2016-04-23: 20 mg via ORAL
  Filled 2016-04-23: qty 1

## 2016-04-23 MED ORDER — PROPOFOL 10 MG/ML IV BOLUS
INTRAVENOUS | Status: AC
  Filled 2016-04-23: qty 20

## 2016-04-23 MED ORDER — ONDANSETRON HCL 4 MG/2ML IJ SOLN: 4.0000 mg | Freq: Once | INTRAMUSCULAR | Status: DC | PRN

## 2016-04-23 MED ORDER — PROPOFOL 10 MG/ML IV BOLUS
INTRAVENOUS | Status: DC | PRN
  Administered 2016-04-23: 200 mg via INTRAVENOUS

## 2016-04-23 MED ORDER — FENTANYL CITRATE (PF) 100 MCG/2ML IJ SOLN
INTRAMUSCULAR | Status: AC
  Filled 2016-04-23: qty 2

## 2016-04-23 MED ORDER — IBUPROFEN 800 MG PO TABS: 800.0000 mg | ORAL_TABLET | Freq: Three times a day (TID) | ORAL | 1 refills | Status: DC

## 2016-04-23 MED ORDER — OXYCODONE-ACETAMINOPHEN 5-325 MG PO TABS: 1.0000 | ORAL_TABLET | ORAL | 0 refills | Status: DC | PRN

## 2016-04-23 MED ORDER — MIDAZOLAM HCL 2 MG/2ML IJ SOLN
INTRAMUSCULAR | Status: DC | PRN
Start: 2016-04-23 — End: 2016-04-23
  Administered 2016-04-23: 2 mg via INTRAVENOUS

## 2016-04-23 MED ORDER — GLYCOPYRROLATE 0.2 MG/ML IJ SOLN
INTRAMUSCULAR | Status: AC
  Filled 2016-04-23: qty 1

## 2016-04-23 MED ORDER — FAMOTIDINE 20 MG PO TABS
20.0000 mg | ORAL_TABLET | Freq: Once | ORAL | Status: AC
  Administered 2016-04-23: 20 mg via ORAL

## 2016-04-23 SURGICAL SUPPLY — 19 items
CATH ROBINSON RED A/P 16FR (CATHETERS) ×3 IMPLANT
DRSG TELFA 3X8 NADH (GAUZE/BANDAGES/DRESSINGS) ×3 IMPLANT
GLOVE BIO SURGEON STRL SZ8 (GLOVE) ×3 IMPLANT
GOWN STRL REUS W/ TWL LRG LVL3 (GOWN DISPOSABLE) ×1 IMPLANT
GOWN STRL REUS W/ TWL XL LVL3 (GOWN DISPOSABLE) ×1 IMPLANT
GOWN STRL REUS W/TWL LRG LVL3 (GOWN DISPOSABLE) ×2
GOWN STRL REUS W/TWL XL LVL3 (GOWN DISPOSABLE) ×2
KIT BERKELEY 1ST TRIMESTER 3/8 (MISCELLANEOUS) ×3 IMPLANT
KIT RM TURNOVER CYSTO AR (KITS) ×3 IMPLANT
PACK DNC HYST (MISCELLANEOUS) ×3 IMPLANT
PAD OB MATERNITY 4.3X12.25 (PERSONAL CARE ITEMS) ×3 IMPLANT
PAD PREP 24X41 OB/GYN DISP (PERSONAL CARE ITEMS) ×3 IMPLANT
SET BERKELEY SUCTION TUBING (SUCTIONS) ×3 IMPLANT
SOL PREP PVP 2OZ (MISCELLANEOUS) ×3
SOLUTION PREP PVP 2OZ (MISCELLANEOUS) ×1 IMPLANT
SPONGE XRAY 4X4 16PLY STRL (MISCELLANEOUS) ×3 IMPLANT
TOWEL OR 17X26 4PK STRL BLUE (TOWEL DISPOSABLE) IMPLANT
VACURETTE 10 RIGID CVD (CANNULA) ×3 IMPLANT
VACURETTE 8 RIGID CVD (CANNULA) IMPLANT

## 2016-04-23 NOTE — Anesthesia Procedure Notes (Signed)
Procedure Name: LMA Insertion Performed by: Samantha Ragen Pre-anesthesia Checklist: Patient identified, Patient being monitored, Timeout performed, Emergency Drugs available and Suction available Patient Re-evaluated:Patient Re-evaluated prior to inductionOxygen Delivery Method: Circle system utilized Preoxygenation: Pre-oxygenation with 100% oxygen Intubation Type: IV induction LMA: LMA inserted LMA Size: 3.0 Tube type: Oral Number of attempts: 1 Placement Confirmation: positive ETCO2 and breath sounds checked- equal and bilateral Tube secured with: Tape Dental Injury: Teeth and Oropharynx as per pre-operative assessment        

## 2016-04-23 NOTE — Discharge Instructions (Addendum)
AMBULATORY SURGERY  DISCHARGE INSTRUCTIONS   1) The drugs that you were given will stay in your system until tomorrow so for the next 24 hours you should not:  A) Drive an automobile B) Make any legal decisions C) Drink any alcoholic beverage   2) You may resume regular meals tomorrow.  Today it is better to start with liquids and gradually work up to solid foods.  You may eat anything you prefer, but it is better to start with liquids, then soup and crackers, and gradually work up to solid foods.   3) Please notify your doctor immediately if you have any unusual bleeding, trouble breathing, redness and pain at the surgery site, drainage, fever, or pain not relieved by medication.    4) Additional Instructions:        Please contact your physician with any problems or Same Day Surgery at [phone], Monday through Friday 6 am to 4 pm, or [place] at [facility] Main number at [phone].AMBULATORY SURGERY  DISCHARGE INSTRUCTIONS   5) The drugs that you were given will stay in your system until tomorrow so for the next 24 hours you should not:  D) Drive an automobile E) Make any legal decisions F) Drink any alcoholic beverage   6) You may resume regular meals tomorrow.  Today it is better to start with liquids and gradually work up to solid foods.  You may eat anything you prefer, but it is better to start with liquids, then soup and crackers, and gradually work up to solid foods.   7) Please notify your doctor immediately if you have any unusual bleeding, trouble breathing, redness and pain at the surgery site, drainage, fever, or pain not relieved by medication.    8) Additional Instructions:        Please contact your physician with any problems or Same Day Surgery at [phone], Monday through Friday 6 am to 4 pm, or [place] at [facility] Main number at [phone].Jan 11 at [facility]1PM

## 2016-04-23 NOTE — Interval H&P Note (Signed)
History and Physical Interval Note:  04/23/2016 11:16 AM  Jillian Anderson  has presented today for surgery, with the diagnosis of missed abortion  The various methods of treatment have been discussed with the patient and family. After consideration of risks, benefits and other options for treatment, the patient has consented to  Procedure(s): DILATATION AND EVACUATION (N/A) as a surgical intervention .  The patient's history has been reviewed, patient examined, no change in status, stable for surgery.  I have reviewed the patient's chart and labs.  Questions were answered to the patient's satisfaction.     Daphine DeutscherMartin A Defrancesco

## 2016-04-23 NOTE — Progress Notes (Signed)
MINIMAL DRAINAGE ON PERI PAD

## 2016-04-23 NOTE — Op Note (Signed)
Jillian SledgeChristina L Anderson PROCEDURE DATE: 04/23/2016 12:05 PM  PREOPERATIVE DIAGNOSIS: Embryonic demise POSTOPERATIVE DIAGNOSIS: Embryonic demise PROCEDURE: Procedure(s): DILATATION AND EVACUATION (N/A) SURGEON:  Dr. Daphine DeutscherMartin A Defrancesco ASSISTANT: None ANESTHESIA: General  INDICATIONS: 30 y.o. G1P0 with history ofEmbryonic demise at 7.[redacted] weeks gestation presents for surgical management.   Please see preoperative notes for further details.   FINDINGS:  Uterus wasMidplane and 8 week size. Tissue consistent with products of conception was removed and sent to Pathology.   I/O's: Total I/O In: -  Out: 20 [Urine:20] SPECIMENS: POC  COMPLICATIONS: None immediate COUNTS:  YES  PROCEDURE IN DETAIL: The patient was brought to the operating room where she was placed into the supine position.  General LMA anesthesia was induced without incident. She was placed in the dorsal lithotomy position using Jillian cane stirrups, and was examined; A Betadine prep and drape was performed in sterile fashion.   A  Red Robinson catheter was used to drain the bladder of urine. A weighted speculum was placed into  the vagina and a single tooth tenaculum was applied to the anterior lip of the cervix. The cervix was gently dilated using Hank's Dilators to accommodate a 10 mm suction curette, that was gently advanced to the uterine fundus.  The suction device was then activated and the curette was slowly rotated to clear the uterine cavity of products of conception.  A sharp curettage with a serrated curette  was then performed to confirm complete emptying of the uterus. Minimal bleeding was encountered. The tenaculum was removed along with all instruments  from the  vagina.   The patient was awakened, mobilized and taken to the recovery room in satisfactory condition.  The procedure was well-tolerated.  The patient will be discharged to home as per PACU criteria.  Routine postoperative instructions were given along with a  prescription for analgesics.  She will follow up in the clinic in 1-2 weeks for postoperative evaluation.  Herold HarmsMartin A Defrancesco, MD [facility]

## 2016-04-23 NOTE — Anesthesia Preprocedure Evaluation (Signed)
Anesthesia Evaluation  Patient identified by MRN, date of birth, ID band Patient awake    Reviewed: Allergy & Precautions, NPO status , Patient's Chart, lab work & pertinent test results, reviewed documented beta blocker date and time   Airway Mallampati: III  TM Distance: >3 FB     Dental  (+) Chipped   Pulmonary           Cardiovascular      Neuro/Psych    GI/Hepatic   Endo/Other    Renal/GU      Musculoskeletal   Abdominal   Peds  Hematology   Anesthesia Other Findings Obese.  Reproductive/Obstetrics                             Anesthesia Physical Anesthesia Plan  ASA: II  Anesthesia Plan: General   Post-op Pain Management:    Induction: Intravenous  Airway Management Planned: LMA  Additional Equipment:   Intra-op Plan:   Post-operative Plan:   Informed Consent: I have reviewed the patients History and Physical, chart, labs and discussed the procedure including the risks, benefits and alternatives for the proposed anesthesia with the patient or authorized representative who has indicated his/her understanding and acceptance.     Plan Discussed with: CRNA  Anesthesia Plan Comments:         Anesthesia Quick Evaluation  

## 2016-04-23 NOTE — H&P (View-Only) (Signed)
Subjective:  PREOPERATIVE HISTORY AND PHYSICAL    Date of surgery: 04/23/2016 Diagnosis: Embryonic demise-7 weeks Procedure: Suction D&C    Patient is a 29 y.o. G1P0female scheduled for suction D&C for management of embryonic demise at 7 weeks' gestation. She has experienced mild cramping and vaginal spotting without passage of tissue. Blood type is O+. Ultrasound on 04/22/2016 demonstrated an embryonic demise with crown rump length consistent with 7.4 weeks without fetal cardiac activity or fetal movement.  OB History    Gravida Para Term Preterm AB Living   1         0   SAB TAB Ectopic Multiple Live Births                  Patient's last menstrual period was 01/21/2016 (exact date).    Past Medical History:  Diagnosis Date  . Allergy   . Asthma without status asthmaticus 03/15/2016  . Astigmatism   . Migraines   . Polycystic disease, ovaries   . Tachycardia   . Tachycardia     Past Surgical History:  Procedure Laterality Date  . WISDOM TOOTH EXTRACTION      OB History  Gravida Para Term Preterm AB Living  1         0  SAB TAB Ectopic Multiple Live Births               # Outcome Date GA Lbr Len/2nd Weight Sex Delivery Anes PTL Lv  1 Current               Social History   Social History  . Marital status: Married    Spouse name: N/A  . Number of children: N/A  . Years of education: N/A   Social History Main Topics  . Smoking status: Never Smoker  . Smokeless tobacco: Never Used  . Alcohol use No     Comment: occassionally  . Drug use: No  . Sexual activity: Yes    Birth control/ protection: None   Other Topics Concern  . Not on file   Social History Narrative  . No narrative on file    Family History  Problem Relation Age of Onset  . Hypertension Mother   . Hyperlipidemia Father   . Diabetes Father   . Asthma Brother      (Not in a hospital admission)  Allergies  Allergen Reactions  . Blackberry Flavor   . Ceclor [Cefaclor]    hives  . Codeine     Body fights itself eyes dilate  . Raspberry   . Red Dye     Hives, nausea and migraines     Review of Systems Constitutional: No recent fever/chills/sweats Respiratory: No recent cough/bronchitis Cardiovascular: No chest pain Gastrointestinal: No recent nausea/vomiting/diarrhea Genitourinary: No UTI symptoms Hematologic/lymphatic:No history of coagulopathy or recent blood thinner use    Objective:    LMP 01/21/2016 (Exact Date)   General:   Normal  Skin:   normal  HEENT:  Normal  Neck:  Supple without Adenopathy or Thyromegaly  Lungs:   Heart:              Breasts:   Abdomen:  Pelvis:  M/S   Extremeties:  Neuro:    clear to auscultation bilaterally   Normal without murmur   Not Examined   soft, non-tender; bowel sounds normal; no masses,  no organomegaly   Exam deferred to OR  No CVAT  Warm/Dry   Normal            Assessment:    1. Embryonic demise-[redacted] weeks gestation 2. O+ blood type   Plan:  Suction D&C   PREOP counseling: The patient is to undergo suction D&C for embryonic demise at 7.[redacted] weeks gestation on 04/23/2016. She is understanding of the planned procedure and is aware of and is accepting of all surgical risks which include but are not limited to bleeding, infection, pelvic organ injury with need for repair, blood clot disorders, anesthesia risks, etc. All questions have been answered. Informed consent is given. Patient is ready and willing to proceed with surgery as scheduled.  Herold HarmsMartin A Defrancesco, MD  Note: This dictation was prepared with Dragon dictation along with smaller phrase technology. Any transcriptional errors that result from this process are unintentional.

## 2016-04-23 NOTE — Transfer of Care (Signed)
Immediate Anesthesia Transfer of Care Note  Patient: Jillian SledgeChristina L Anderson  Procedure(s) Performed: Procedure(s): DILATATION AND EVACUATION (N/A)  Patient Location: PACU  Anesthesia Type:General  Level of Consciousness: sedated  Airway & Oxygen Therapy: Patient Spontanous Breathing and Patient connected to face mask oxygen  Post-op Assessment: Report given to RN and Post -op Vital signs reviewed and stable  Post vital signs: Reviewed and stable  Last Vitals:  Vitals:   04/23/16 1059 04/23/16 1212  BP: 124/89 122/78  Pulse: 73 69  Resp: 18 16  Temp: 36.3 C     Last Pain:  Vitals:   04/23/16 1059  TempSrc: Tympanic  PainSc: 4       Patients Stated Pain Goal: 2 (04/23/16 1059)  Complications: No apparent anesthesia complications

## 2016-04-23 NOTE — Anesthesia Postprocedure Evaluation (Signed)
Anesthesia Post Note  Patient: Jillian Anderson  Procedure(s) Performed: Procedure(s) (LRB): DILATATION AND EVACUATION (N/A)  Patient location during evaluation: PACU Anesthesia Type: General Level of consciousness: awake and alert Pain management: pain level controlled Vital Signs Assessment: post-procedure vital signs reviewed and stable Respiratory status: spontaneous breathing, nonlabored ventilation, respiratory function stable and patient connected to nasal cannula oxygen Cardiovascular status: blood pressure returned to baseline and stable Postop Assessment: no signs of nausea or vomiting Anesthetic complications: no     Last Vitals:  Vitals:   04/23/16 1315 04/23/16 1338  BP: 121/70 123/78  Pulse: 73 62  Resp: 16   Temp: 36.2 C     Last Pain:  Vitals:   04/23/16 1315  TempSrc: Temporal  PainSc: 4                  Suheily Birks S

## 2016-04-24 ENCOUNTER — Encounter: Payer: Self-pay | Admitting: Obstetrics and Gynecology

## 2016-04-27 LAB — SURGICAL PATHOLOGY

## 2016-05-04 ENCOUNTER — Encounter: Payer: BLUE CROSS/BLUE SHIELD | Admitting: Obstetrics and Gynecology

## 2016-05-07 ENCOUNTER — Encounter: Payer: BLUE CROSS/BLUE SHIELD | Admitting: Obstetrics and Gynecology

## 2016-05-10 ENCOUNTER — Other Ambulatory Visit: Payer: Self-pay

## 2016-05-10 DIAGNOSIS — Z3201 Encounter for pregnancy test, result positive: Secondary | ICD-10-CM

## 2016-05-10 DIAGNOSIS — Z3A01 Less than 8 weeks gestation of pregnancy: Secondary | ICD-10-CM

## 2016-05-10 NOTE — Telephone Encounter (Signed)
Patient has not been seen and does not have a follow up scheduled last filled by Dr.WAlker 12/06/14 90 3rf Left message to return call to verify if still taking.

## 2016-05-11 NOTE — Telephone Encounter (Signed)
Patient states she is taking metoprolol 25mg  2 tab qd, and xyzal. Patient states she was followed by obgyn for pregnancy but has had a miscarriage and is no onger followed there. I have scheduled patient for an annual. Patient would like a refill for these medications and will come in if needed before her annual.

## 2016-05-12 MED ORDER — LEVOCETIRIZINE DIHYDROCHLORIDE 5 MG PO TABS
5.0000 mg | ORAL_TABLET | Freq: Every day | ORAL | 3 refills | Status: DC
Start: 2016-05-12 — End: 2017-04-20

## 2016-05-12 MED ORDER — METOPROLOL TARTRATE 25 MG PO TABS: 50.0000 mg | ORAL_TABLET | Freq: Every day | ORAL | 3 refills | Status: DC

## 2016-05-12 NOTE — Telephone Encounter (Signed)
I have sent in refills. I would like to see her in the office to discuss these medications prior to her physical exam. Thanks.

## 2016-05-12 NOTE — Telephone Encounter (Signed)
Left message to return call 

## 2016-05-13 ENCOUNTER — Ambulatory Visit (INDEPENDENT_AMBULATORY_CARE_PROVIDER_SITE_OTHER): Payer: BLUE CROSS/BLUE SHIELD | Admitting: Obstetrics and Gynecology

## 2016-05-13 ENCOUNTER — Encounter: Payer: Self-pay | Admitting: Obstetrics and Gynecology

## 2016-05-13 VITALS — BP 128/91 | HR 87 | Ht 62.0 in | Wt 184.1 lb

## 2016-05-13 DIAGNOSIS — Z9889 Other specified postprocedural states: Secondary | ICD-10-CM | POA: Insufficient documentation

## 2016-05-13 DIAGNOSIS — O021 Missed abortion: Secondary | ICD-10-CM | POA: Insufficient documentation

## 2016-05-13 DIAGNOSIS — Z09 Encounter for follow-up examination after completed treatment for conditions other than malignant neoplasm: Secondary | ICD-10-CM

## 2016-05-13 NOTE — Progress Notes (Signed)
Chief complaint: 1. Postop check 2. Status post suction D&C for embryonic demise  PATHOLOGY: DIAGNOSIS:  A. UTERINE CONTENTS; DILATATION AND EVACUATION:  - PRODUCTS OF CONCEPTION: CHORIONIC VILLI WITH DEGENERATIVE CHANGES.  - DECIDUAL TISSUE WITH ACUTE INFLAMMATION.    SUBJECTIVE: Patient has done well since surgery. Bowel and bladder function are normal. She is not experiencing any significant pelvic pain. She is not having any significant vaginal bleeding or discharge this time.  OBJECTIVE: BP (!) 128/91   Pulse 87   Ht 5\' 2"  (1.575 m)   Wt 184 lb 1.6 oz (83.5 kg)   LMP 01/21/2016 (Exact Date)   Breastfeeding? Unknown   BMI 33.67 kg/m  Physical exam-deferred  ASSESSMENT: 1. Status post suction D&C for embryonic demise on 04/23/2016 2. Normal postop check  PLAN: 1. Resume activities as tolerated 2. Await 1 normal cycle before attempting conception 3. Continue with prenatal vitamins  Herold HarmsMartin A Massey Ruhland, MD  Note: This dictation was prepared with Dragon dictation along with smaller phrase technology. Any transcriptional errors that result from this process are unintentional.

## 2016-05-13 NOTE — Patient Instructions (Signed)
1. Resume all activities without restriction 2. Wait for 1 normal cycle before attempting conception 3. Continue taking prenatal vitamins

## 2016-05-26 NOTE — Telephone Encounter (Signed)
scheduled

## 2016-06-04 ENCOUNTER — Ambulatory Visit (INDEPENDENT_AMBULATORY_CARE_PROVIDER_SITE_OTHER): Payer: BLUE CROSS/BLUE SHIELD | Admitting: Family Medicine

## 2016-06-04 ENCOUNTER — Other Ambulatory Visit (HOSPITAL_COMMUNITY)
Admission: RE | Admit: 2016-06-04 | Discharge: 2016-06-04 | Disposition: A | Discharge status: Home / Self Care | Payer: BLUE CROSS/BLUE SHIELD | Source: Ambulatory Visit | Attending: Family Medicine | Admitting: Family Medicine

## 2016-06-04 ENCOUNTER — Encounter: Payer: Self-pay | Admitting: Family Medicine

## 2016-06-04 VITALS — BP 118/84 | HR 82 | Temp 98.6°F | Wt 188.0 lb

## 2016-06-04 DIAGNOSIS — E785 Hyperlipidemia, unspecified: Secondary | ICD-10-CM

## 2016-06-04 DIAGNOSIS — N912 Amenorrhea, unspecified: Secondary | ICD-10-CM

## 2016-06-04 DIAGNOSIS — E7800 Pure hypercholesterolemia, unspecified: Secondary | ICD-10-CM | POA: Diagnosis not present

## 2016-06-04 DIAGNOSIS — H9209 Otalgia, unspecified ear: Secondary | ICD-10-CM | POA: Insufficient documentation

## 2016-06-04 DIAGNOSIS — Z124 Encounter for screening for malignant neoplasm of cervix: Secondary | ICD-10-CM

## 2016-06-04 DIAGNOSIS — Z01419 Encounter for gynecological examination (general) (routine) without abnormal findings: Secondary | ICD-10-CM | POA: Diagnosis not present

## 2016-06-04 DIAGNOSIS — H9203 Otalgia, bilateral: Secondary | ICD-10-CM

## 2016-06-04 DIAGNOSIS — R002 Palpitations: Secondary | ICD-10-CM | POA: Diagnosis not present

## 2016-06-04 DIAGNOSIS — O021 Missed abortion: Secondary | ICD-10-CM | POA: Diagnosis not present

## 2016-06-04 DIAGNOSIS — Z23 Encounter for immunization: Secondary | ICD-10-CM | POA: Diagnosis not present

## 2016-06-04 LAB — POCT URINE PREGNANCY: PREG TEST UR: NEGATIVE

## 2016-06-04 NOTE — Assessment & Plan Note (Addendum)
Has not yet had a period. Pregnancy test is negative. She'll monitor for menstrual cycles to restart.

## 2016-06-04 NOTE — Progress Notes (Signed)
  Jillian AlarEric Shailynn Fong, MD Phone: [phone]  Jillian Anderson is a 31 y.o. female who presents today for follow-up.  Palpitations: Has had no recurrence of these. Takes metoprolol. No chest pain or shortness of breath.  Hyperlipidemia: Elevated cholesterol on last check. Diet is described as average. Salads at lunch. Exercises 4 days a week by doing yoga and cardio.  Patient was treated for an ear infection previously with 2 rounds of Augmentin. Notes her ear is still bother her some. Taking Motrin at night for them. They're sensitive to noise. No fevers. Has an appointment with ear nose and throat next week.  Patient underwent D&C for embryonic demise 6 weeks ago. She notes she has not yet had a period. No abdominal pain. She is due for a Pap smear as well.  PMH: nonsmoker.   ROS see history of present illness  Objective  Physical Exam Vitals:   06/04/16 1115  BP: 118/84  Pulse: 82  Temp: 98.6 F (37 C)    BP Readings from Last 3 Encounters:  06/04/16 118/84  05/13/16 (!) 128/91  04/23/16 123/78   Wt Readings from Last 3 Encounters:  06/04/16 188 lb (85.3 kg)  05/13/16 184 lb 1.6 oz (83.5 kg)  04/22/16 185 lb (83.9 kg)    Physical Exam  Constitutional: No distress.  HENT:  Head: Normocephalic and atraumatic.  Mouth/Throat: Oropharynx is clear and moist. No oropharyngeal exudate.  Normal TMs bilaterally  Eyes: Conjunctivae are normal. Pupils are equal, round, and reactive to light.  Cardiovascular: Normal rate, regular rhythm and normal heart sounds.   Pulmonary/Chest: Effort normal and breath sounds normal.  Abdominal: Soft. Bowel sounds are normal.  Genitourinary:  Genitourinary Comments: Normal labia, normal vaginal mucosa, clear mucus noted, normal appearing cervix, normal bimanual exam  Musculoskeletal: She exhibits no edema.  Neurological: She is alert. Gait normal.  Skin: Skin is warm and dry. She is not diaphoretic.     Assessment/Plan: Please see  individual problem list.  Palpitations Stable. Continue metoprolol.  Embryonic demise Has not yet had a period. Pregnancy test is negative. She'll monitor for menstrual cycles to restart.  Ear pain Suspect related to eustachian tube dysfunction following recent illness. No apparent otitis media or otitis externa on exam. Discussed Xyzal and Flonase. She will see ENT next week.  Hyperlipidemia Noted on prior lab work. Advised to continue diet and exercise. She'll return for fasting lipid panel.  Patient due for Pap smear as well. This was completed.  Orders Placed This Encounter  Procedures  . Lipid Profile    Standing Status:   Future    Standing Expiration Date:   06/04/2017  . POCT urine pregnancy    Jillian AlarEric Keahi Mccarney, MD [facility] [place]

## 2016-06-04 NOTE — Patient Instructions (Signed)
Nice to see you. We will have you return for a fasting lipid panel. Please see your Ear nose and throat physician next week. Please monitor for return of your menstrual cycle. Please continue to work on diet and exercise.

## 2016-06-04 NOTE — Assessment & Plan Note (Signed)
Suspect related to eustachian tube dysfunction following recent illness. No apparent otitis media or otitis externa on exam. Discussed Xyzal and Flonase. She will see ENT next week.

## 2016-06-04 NOTE — Progress Notes (Signed)
Pre visit review using our clinic review tool, if applicable. No additional management support is needed unless otherwise documented below in the visit note. 

## 2016-06-04 NOTE — Assessment & Plan Note (Signed)
Noted on prior lab work. Advised to continue diet and exercise. She'll return for fasting lipid panel.

## 2016-06-04 NOTE — Assessment & Plan Note (Signed)
--   Stable.  Continue metoprolol. 

## 2016-06-07 ENCOUNTER — Other Ambulatory Visit (INDEPENDENT_AMBULATORY_CARE_PROVIDER_SITE_OTHER): Payer: BLUE CROSS/BLUE SHIELD

## 2016-06-07 DIAGNOSIS — E7800 Pure hypercholesterolemia, unspecified: Secondary | ICD-10-CM

## 2016-06-07 LAB — LIPID PANEL
CHOL/HDL RATIO: 4
Cholesterol: 190 mg/dL (ref 0–200)
HDL: 51.6 mg/dL (ref >39.00)
LDL CALC: 109 mg/dL — AB (ref 0–99)
NONHDL: 138.66
Triglycerides: 146 mg/dL (ref 0.0–149.0)
VLDL: 29.2 mg/dL (ref 0.0–40.0)

## 2016-06-08 ENCOUNTER — Encounter: Payer: Self-pay | Admitting: Obstetrics and Gynecology

## 2016-06-08 LAB — CYTOLOGY - PAP: Diagnosis: NEGATIVE

## 2016-06-21 ENCOUNTER — Ambulatory Visit: Payer: BLUE CROSS/BLUE SHIELD | Admitting: Family Medicine

## 2016-07-16 ENCOUNTER — Encounter: Payer: BLUE CROSS/BLUE SHIELD | Admitting: Family Medicine

## 2016-10-21 ENCOUNTER — Ambulatory Visit (INDEPENDENT_AMBULATORY_CARE_PROVIDER_SITE_OTHER): Payer: BLUE CROSS/BLUE SHIELD | Admitting: Obstetrics and Gynecology

## 2016-10-21 ENCOUNTER — Encounter: Payer: Self-pay | Admitting: Obstetrics and Gynecology

## 2016-10-21 VITALS — BP 128/78 | HR 74 | Ht 62.0 in | Wt 186.5 lb

## 2016-10-21 DIAGNOSIS — E669 Obesity, unspecified: Secondary | ICD-10-CM | POA: Diagnosis not present

## 2016-10-21 DIAGNOSIS — Z9889 Other specified postprocedural states: Secondary | ICD-10-CM

## 2016-10-21 DIAGNOSIS — N912 Amenorrhea, unspecified: Secondary | ICD-10-CM

## 2016-10-21 DIAGNOSIS — E282 Polycystic ovarian syndrome: Secondary | ICD-10-CM | POA: Diagnosis not present

## 2016-10-21 LAB — POCT URINE PREGNANCY: PREG TEST UR: NEGATIVE

## 2016-10-21 MED ORDER — MEDROXYPROGESTERONE ACETATE 10 MG PO TABS
10.0000 mg | ORAL_TABLET | Freq: Every day | ORAL | 0 refills | Status: DC
Start: 2016-10-21 — End: 2017-02-02

## 2016-10-21 NOTE — Patient Instructions (Signed)
1. TSH is obtained today 2. Ultrasound is scheduled 3. Provera 10 mg a day for 10 days is given to cause Menses 4. Maintain menstrual calendar monitoring for any abnormal uterine bleeding 5. Return in 3 months for follow-up

## 2016-10-21 NOTE — Progress Notes (Signed)
GYN ENCOUNTER NOTE  Subjective:       Jillian Anderson is a 31 y.o. 801P0010 female is here for gynecologic evaluation of the following issues:  1. No menstrual cycle since 07/16/2016  Patient presents to clinic for amenorrhea.  She has a PMH significant for PCOS and spontaneous abortion and D&C 04/23/2016.  [facility] she reports normal cycles in February and March 2018 but none since.  She describes having the sensation that she will menstruate but then doesn't.  She denies wt gain or wt loss, vasomotor symptoms, systemic illness, excessive stress, visual changes, nipple discharge or headaches.  Previously she was having regular cycles prior to pregnancy.    Gynecologic History Patient's last menstrual period was 07/16/2016 (exact date). Contraception: none  Obstetric History OB History  Gravida Para Term Preterm AB Living  1       1 0  SAB TAB Ectopic Multiple Live Births  1            # Outcome Date GA Lbr Len/2nd Weight Sex Delivery Anes PTL Lv  1 SAB               Past Medical History:  Diagnosis Date  . Allergy   . Asthma without status asthmaticus 03/15/2016  . Astigmatism   . GERD (gastroesophageal reflux disease)   . History of kidney stones    In the past  . Migraines   . PCOS (polycystic ovarian syndrome)   . Polycystic disease, ovaries   . Tachycardia   . Tachycardia     Past Surgical History:  Procedure Laterality Date  . DILATION AND EVACUATION N/A 04/23/2016   Procedure: DILATATION AND EVACUATION;  Surgeon: Herold HarmsMartin A Westyn Keatley, MD;  Location: [facility] ORS;  Service: Gynecology;  Laterality: N/A;  . WISDOM TOOTH EXTRACTION      Current Outpatient Prescriptions on File Prior to Visit  Medication Sig Dispense Refill  . levocetirizine (XYZAL) 5 MG tablet Take 1 tablet (5 mg total) by mouth daily. 90 tablet 3  . Probiotic Product (PROBIOTIC FORMULA) CAPS Take 1 capsule by mouth daily.     . [DISCONTINUED] potassium chloride SA (K-DUR,KLOR-CON) 20 MEQ  tablet Take 1 tablet (20 mEq total) by mouth daily. X 3 day, repeat labs next week 3 tablet 0   No current facility-administered medications on file prior to visit.     Allergies  Allergen Reactions  . Ceclor [Cefaclor]     hives  . Codeine     Body fights itself eyes dilate  . Red Dye     Hives, nausea and migraines   . Blackberry Flavor Nausea And Vomiting and Rash  . Raspberry Nausea And Vomiting and Rash    Social History   Social History  . Marital status: Married    Spouse name: N/A  . Number of children: N/A  . Years of education: N/A   Occupational History  . Not on file.   Social History Main Topics  . Smoking status: Never Smoker  . Smokeless tobacco: Never Used  . Alcohol use 0.6 oz/week    1 Glasses of wine per week     Comment: occassionally  . Drug use: No  . Sexual activity: Yes    Birth control/ protection: None   Other Topics Concern  . Not on file   Social History Narrative  . No narrative on file    Family History  Problem Relation Age of Onset  . Hypertension Mother   .  Hyperlipidemia Father   . Diabetes Father   . Asthma Brother   . Breast cancer Neg Hx   . Ovarian cancer Neg Hx   . Colon cancer Neg Hx     The following portions of the patient's history were reviewed and updated as appropriate: allergies, current medications, past family history, past medical history, past social history, past surgical history and problem list.  Review of Systems Review of Systems - General ROS: negative for - chills, fatigue, fever, hot flashes, malaise or night sweats Hematological and Lymphatic ROS: negative for - bleeding problems or swollen lymph nodes Gastrointestinal ROS: negative for - abdominal pain, blood in stools, change in bowel habits and nausea/vomiting Musculoskeletal ROS: negative for - joint pain, muscle pain or muscular weakness Genito-Urinary ROS: negative for -, genital ulcers, hematuria, incontinence, irregular/heavy menses,  nocturia or pelvic pain. Positive for changes in mensturation; amenorhria   Objective:   BP 128/78   Pulse 74   Ht 5\' 2"  (1.575 m)   Wt 186 lb 8 oz (84.6 kg)   LMP 07/16/2016 (Exact Date)   Breastfeeding? No Comment: light  BMI 34.11 kg/m  CONSTITUTIONAL: Well-developed, well-nourished female in no acute distress.  HENT:  Normocephalic, atraumatic.  NECK: Normal range of motion, supple, no masses.  Normal thyroid.  SKIN: Skin is warm and dry. No rash noted. Not diaphoretic. No erythema. No pallor. NEUROLGIC: Alert and oriented to person, place, and time. PSYCHIATRIC: Normal mood and affect. Normal behavior. Normal judgment and thought content. CARDIOVASCULAR:Not Examined RESPIRATORY: Not Examined BREASTS: Not Examined ABDOMEN: Soft, non distended; Non tender.  No Organomegaly. PELVIC:  External Genitalia: Normal  BUS: Normal  Vagina: Normal  Cervix: Ectropion Cervix   Uterus: Normal size, shape,consistency, mobile  Adnexa: R-non tender non palpable; L- non tender, left adnexal fullness uncertain etiology  RV: Normal   Bladder: Nontender MUSCULOSKELETAL: Normal range of motion. No tenderness.  No cyanosis, clubbing, or edema.     Assessment:   1. Amenorrhea - POCT urine pregnancy - TSH - US Pelvis Complete; Future - US Transvaginal Non-OB; Future  2. Obesity (BMI 30.0-34.9) -Encourage healthy eating and exercise 3. Status post D&C, doubt synechiae contributing to amenorrhea  4. PCOS (polycystic ovarian syndrome)     Plan:   1. Pelvic ultrasound to determine anatomy and r/o left adnexal cysts 2. Provera 10mg  qd x 10 days 3. TSH lab ordered 4. Follow up if cycles remain inconsistent or absent after Provera course 5. Menstrual calendar monitoring for any abnormal uterine bleeding  A total of 15 minutes were spent face-to-face with the patient during this encounter and over half of that time dealt with counseling and coordination of care.   Flint Melter, PA-S  [facility] Herold Harms, MD   I have seen, interviewed, and examined the patient in conjunction with the [facility].A. student and affirm the diagnosis and management plan. Edyn Popoca A. Jesus Nevills, MD, FACOG   Note: This dictation was prepared with Dragon dictation along with smaller phrase technology. Any transcriptional errors that result from this process are unintentional.

## 2016-10-22 LAB — TSH: TSH: 1.66 u[IU]/mL (ref 0.450–4.500)

## 2016-10-25 ENCOUNTER — Ambulatory Visit (INDEPENDENT_AMBULATORY_CARE_PROVIDER_SITE_OTHER): Payer: BLUE CROSS/BLUE SHIELD

## 2016-10-25 DIAGNOSIS — N912 Amenorrhea, unspecified: Secondary | ICD-10-CM | POA: Diagnosis not present

## 2016-12-03 ENCOUNTER — Ambulatory Visit: Payer: BLUE CROSS/BLUE SHIELD | Admitting: Family Medicine

## 2017-01-25 ENCOUNTER — Encounter: Payer: BLUE CROSS/BLUE SHIELD | Admitting: Obstetrics and Gynecology

## 2017-02-02 ENCOUNTER — Encounter: Payer: Self-pay | Admitting: Obstetrics and Gynecology

## 2017-02-02 ENCOUNTER — Ambulatory Visit (INDEPENDENT_AMBULATORY_CARE_PROVIDER_SITE_OTHER): Payer: Self-pay | Admitting: Obstetrics and Gynecology

## 2017-02-02 VITALS — BP 123/79 | HR 71 | Ht 62.0 in | Wt 193.0 lb

## 2017-02-02 DIAGNOSIS — N912 Amenorrhea, unspecified: Secondary | ICD-10-CM

## 2017-02-02 DIAGNOSIS — E282 Polycystic ovarian syndrome: Secondary | ICD-10-CM

## 2017-02-02 DIAGNOSIS — N970 Female infertility associated with anovulation: Secondary | ICD-10-CM

## 2017-02-02 LAB — POCT URINE PREGNANCY: PREG TEST UR: NEGATIVE

## 2017-02-02 MED ORDER — MEDROXYPROGESTERONE ACETATE 10 MG PO TABS: 10.0000 mg | ORAL_TABLET | Freq: Every day | ORAL | 0 refills | Status: DC

## 2017-02-02 MED ORDER — CLOMIPHENE CITRATE 50 MG PO TABS: 50.0000 mg | ORAL_TABLET | Freq: Every day | ORAL | 6 refills | Status: DC

## 2017-02-02 NOTE — Progress Notes (Signed)
Chief complaint: 1. Secondary infertility 2. Amenorrhea  31 year old white female para 0010, status post suction D&C for spontaneous abortion in the past, presents for follow-up on irregular menstrual cycles/amenorrhea. At last visit patient was given Provera for initiation of menses. She did have a withdrawal bleed in July. Since that time she has not had any recurrence of menses. TSH was normal. Patient is desiring a trial of conception and is willing to go on Clomid therapy.  Past Medical History:  Diagnosis Date  . Allergy   . Asthma without status asthmaticus 03/15/2016  . Astigmatism   . GERD (gastroesophageal reflux disease)   . History of kidney stones    In the past  . Migraines   . PCOS (polycystic ovarian syndrome)   . Polycystic disease, ovaries   . Tachycardia   . Tachycardia      Past medical history, past surgical history, problem list, medications, and allergies are reviewed  OBJECTIVE: BP 123/79   Pulse 71   Ht  (1.575 m)   Wt 193 lb (87.5 kg)   LMP 11/02/2016 (Exact Date)   BMI 35.30 kg/m  Physical exam-deferred  ASSESSMENT: 1. PCO 2. Secondary infertility  PLAN: 1. Pregnancy test today is negative 2. Begin Provera 10 mg a day for 10 days to cause a menses. 3. Take Clomid 50 mg a day days 5 through 9 of cycle to induce ovulation. 4. Have timed intercourse on days 13 and 14 and 15 of menstrual cycle 5. Return on day 22 of cycle for serum progesterone level 6. Continue taking prenatal vitamins daily 7. Return in 6 weeks for follow-  A total of 15 minutes were spent face-to-face with the patient during this encounter and over half of that time dealt with counseling and coordination of care.  Herold Harms, MD  Note: This dictation was prepared with Dragon dictation along with smaller phrase technology. Any transcriptional errors that result from this process are unintentional.

## 2017-02-02 NOTE — Patient Instructions (Signed)
1. Pregnancy test today is negative 2. Begin Provera 10 mg a day for 10 days to cause a menses. 3. Take Clomid 50 mg a day days 5 through 9 of cycle to induce ovulation. 4. Have timed intercourse on days 13 and 14 and 15 of menstrual cycle 5. Return on day 22 of cycle for serum progesterone level 6. Continue taking prenatal vitamins daily 7. Return in 6 weeks for follow-up

## 2017-02-03 ENCOUNTER — Encounter: Payer: Self-pay | Admitting: Obstetrics and Gynecology

## 2017-03-08 ENCOUNTER — Other Ambulatory Visit: Payer: Self-pay

## 2017-03-08 DIAGNOSIS — N912 Amenorrhea, unspecified: Secondary | ICD-10-CM

## 2017-03-08 DIAGNOSIS — N926 Irregular menstruation, unspecified: Secondary | ICD-10-CM

## 2017-03-09 ENCOUNTER — Other Ambulatory Visit: Payer: Self-pay

## 2017-03-09 ENCOUNTER — Telehealth: Payer: Self-pay

## 2017-03-09 ENCOUNTER — Encounter: Payer: Self-pay | Admitting: Obstetrics and Gynecology

## 2017-03-09 DIAGNOSIS — N912 Amenorrhea, unspecified: Secondary | ICD-10-CM

## 2017-03-09 DIAGNOSIS — E282 Polycystic ovarian syndrome: Secondary | ICD-10-CM

## 2017-03-09 LAB — PROGESTERONE: PROGESTERONE: 1.6 ng/mL

## 2017-03-09 LAB — BETA HCG QUANT (REF LAB)

## 2017-03-09 MED ORDER — MEDROXYPROGESTERONE ACETATE 10 MG PO TABS: 10.0000 mg | ORAL_TABLET | Freq: Every day | ORAL | 0 refills | Status: DC

## 2017-03-09 MED ORDER — CLOMIPHENE CITRATE 50 MG PO TABS: 100.0000 mg | ORAL_TABLET | Freq: Every day | ORAL | 6 refills | Status: DC

## 2017-03-09 NOTE — Telephone Encounter (Signed)
-----   Message from Herold HarmsMartin A Defrancesco, MD sent at 03/09/2017  8:40 AM EST ----- Please notify - Abnormal Labs-consistent with anovulation Call in Provera 10 mg a day times 10 days Call in Clomid 100 mg a day days 5 through 9 Day 22 serum progesterone to be done

## 2017-03-16 ENCOUNTER — Encounter: Payer: Self-pay | Admitting: Obstetrics and Gynecology

## 2017-03-21 ENCOUNTER — Encounter: Payer: Self-pay | Admitting: Family Medicine

## 2017-03-21 ENCOUNTER — Ambulatory Visit (INDEPENDENT_AMBULATORY_CARE_PROVIDER_SITE_OTHER): Payer: Self-pay | Admitting: Family Medicine

## 2017-03-21 VITALS — BP 110/78 | HR 79 | Temp 98.0°F | Ht 62.0 in | Wt 189.0 lb

## 2017-03-21 DIAGNOSIS — R05 Cough: Secondary | ICD-10-CM

## 2017-03-21 DIAGNOSIS — J0140 Acute pansinusitis, unspecified: Secondary | ICD-10-CM

## 2017-03-21 DIAGNOSIS — R059 Cough, unspecified: Secondary | ICD-10-CM

## 2017-03-21 MED ORDER — PROMETHAZINE-DM 6.25-15 MG/5ML PO SYRP: 5.0000 mL | ORAL_SOLUTION | Freq: Four times a day (QID) | ORAL | 0 refills | Status: DC | PRN

## 2017-03-21 MED ORDER — AMOXICILLIN-POT CLAVULANATE 875-125 MG PO TABS
1.0000 | ORAL_TABLET | Freq: Two times a day (BID) | ORAL | 0 refills | Status: DC
Start: 2017-03-21 — End: 2017-04-20

## 2017-03-21 NOTE — Progress Notes (Signed)
Patient ID: Jillian SledgeChristina L Saad, female   DOB: 10/07/1985, 31 y.o.   MRN: [id number]  PCP: Glori LuisSonnenberg, Eric G, MD  Subjective:  Jillian Anderson is a 31 y.o. year old very pleasant female patient who presents with symptoms including nasal congestion, rhinitis, ear pressure and itching, cough that is productive of yellow/green sputum. Associated chills and low grade fever of 99.4 F at home -started: 12 days ago  symptoms are not improving. She reports improvement then worsening symptoms. -previous treatments: ibuprofen and acetaminophen and cough drops and vicks for symptoms have provided limited benefit.  -sick contacts/travel/risks: denies flu exposure. No recent antibiotic use. No recent sick contact exposure. -Hx of: allergies and eczema, asthma  ROS-denies, SOB, NVD, myalgias, tooth pain  Pertinent Past Medical History- allergies/asthma; no daily medication needed for the past 2 years.  Medications- reviewed  Current Outpatient Medications  Medication Sig Dispense Refill  . clomiPHENE (CLOMID) 50 MG tablet Take 2 tablets (100 mg total) daily by mouth. Days 5-9 5 tablet 6  . levocetirizine (XYZAL) 5 MG tablet Take 1 tablet (5 mg total) by mouth daily. 90 tablet 3  . medroxyPROGESTERone (PROVERA) 10 MG tablet Take 1 tablet (10 mg total) daily by mouth. 10 tablet 0  . metoprolol succinate (TOPROL XL) 25 MG 24 hr tablet Take 25 mg by mouth daily.    . ranitidine (ZANTAC) 150 MG capsule Take 150 mg by mouth 2 (two) times daily.     No current facility-administered medications for this visit.     Objective: BP 110/78   Pulse 79   Temp 98 F (36.7 C) (Oral)   Ht 5\' 2"  (1.575 m)   Wt 189 lb (85.7 kg)   SpO2 99%   BMI 34.57 kg/m  Gen: NAD, resting comfortably HEENT: Turbinates erythematous, TM normal, pharynx mildly erythematous with no tonsilar exudate or edema, + frontal/maxillay sinus tenderness CV: RRR no murmurs rubs or gallops Lungs: CTAB no crackles, wheeze,  rhonchi Abdomen: soft/nontender/nondistended/normal bowel sounds. No rebound or guarding.  Ext: no edema Skin: warm, dry, no rash Neuro: grossly normal, moves all extremities  Assessment/Plan: 1. Acute pansinusitis, recurrence not specified Double sickening presentation; worsening symptoms; exam and history support treatment with Augmentin. Advised patient on supportive measures:  Get rest, drink plenty of fluids, and use tylenol or ibuprofen as needed for pain. Follow up if fever >101, if symptoms worsen or if symptoms are not improved in 3 days. Patient verbalizes understanding.    2. Cough Will treat symptomatically with promethazine/dextromethorphan.  Patient denies pregnancy at this time.  Finally, we reviewed reasons to return to care including if symptoms worsen or persist or new concerns arise- once again particularly shortness of breath or fever.    Inez CatalinaJulia Ann Irish Breisch, FNP

## 2017-03-21 NOTE — Patient Instructions (Signed)
It was a pleasure to see you today. Please drink plenty of water so that your urine is pale yellow or clear. Also, get plenty of rest, use tylenol or ibuprofen as needed for discomfort and follow up if symptoms do not improve in 3 to 4 days, worsen, or you develop a fever >101.   Sinusitis, Adult Sinusitis is soreness and inflammation of your sinuses. Sinuses are hollow spaces in the bones around your face. They are located:  Around your eyes.  In the middle of your forehead.  Behind your nose.  In your cheekbones.  Your sinuses and nasal passages are lined with a stringy fluid (mucus). Mucus normally drains out of your sinuses. When your nasal tissues get inflamed or swollen, the mucus can get trapped or blocked so air cannot flow through your sinuses. This lets bacteria, viruses, and funguses grow, and that leads to infection. Follow these instructions at home: Medicines  Take, use, or apply over-the-counter and prescription medicines only as told by your doctor. These may include nasal sprays.  If you were prescribed an antibiotic medicine, take it as told by your doctor. Do not stop taking the antibiotic even if you start to feel better. Hydrate and Humidify  Drink enough water to keep your pee (urine) clear or pale yellow.  Use a cool mist humidifier to keep the humidity level in your home above 50%.  Breathe in steam for 10-15 minutes, 3-4 times a day or as told by your doctor. You can do this in the bathroom while a hot shower is running.  Try not to spend time in cool or dry air. Rest  Rest as much as possible.  Sleep with your head raised (elevated).  Make sure to get enough sleep each night. General instructions  Put a warm, moist washcloth on your face 3-4 times a day or as told by your doctor. This will help with discomfort.  Wash your hands often with soap and water. If there is no soap and water, use hand sanitizer.  Do not smoke. Avoid being around people  who are smoking (secondhand smoke).  Keep all follow-up visits as told by your doctor. This is important. Contact a doctor if:  You have a fever.  Your symptoms get worse.  Your symptoms do not get better within 10 days. Get help right away if:  You have a very bad headache.  You cannot stop throwing up (vomiting).  You have pain or swelling around your face or eyes.  You have trouble seeing.  You feel confused.  Your neck is stiff.  You have trouble breathing. This information is not intended to replace advice given to you by your health care provider. Make sure you discuss any questions you have with your health care provider. Document Released: 10/06/2007 Document Revised: 12/14/2015 Document Reviewed: 02/12/2015 Elsevier Interactive Patient Education  [employer]2018 Elsevier Inc.

## 2017-04-03 ENCOUNTER — Other Ambulatory Visit: Payer: Self-pay | Admitting: Family Medicine

## 2017-04-12 ENCOUNTER — Encounter: Payer: Self-pay | Admitting: Obstetrics and Gynecology

## 2017-04-13 ENCOUNTER — Other Ambulatory Visit: Payer: Self-pay

## 2017-04-13 DIAGNOSIS — E282 Polycystic ovarian syndrome: Secondary | ICD-10-CM

## 2017-04-13 DIAGNOSIS — N912 Amenorrhea, unspecified: Secondary | ICD-10-CM

## 2017-04-14 LAB — PROGESTERONE: Progesterone: 15 ng/mL

## 2017-04-20 ENCOUNTER — Ambulatory Visit: Payer: Self-pay | Admitting: Obstetrics and Gynecology

## 2017-04-20 ENCOUNTER — Encounter: Payer: Self-pay | Admitting: Obstetrics and Gynecology

## 2017-04-20 VITALS — BP 122/75 | HR 89 | Ht 63.0 in | Wt 186.3 lb

## 2017-04-20 DIAGNOSIS — Z3201 Encounter for pregnancy test, result positive: Secondary | ICD-10-CM

## 2017-04-20 DIAGNOSIS — Z3687 Encounter for antenatal screening for uncertain dates: Secondary | ICD-10-CM | POA: Insufficient documentation

## 2017-04-20 DIAGNOSIS — N912 Amenorrhea, unspecified: Secondary | ICD-10-CM

## 2017-04-20 DIAGNOSIS — O219 Vomiting of pregnancy, unspecified: Secondary | ICD-10-CM

## 2017-04-20 DIAGNOSIS — O09299 Supervision of pregnancy with other poor reproductive or obstetric history, unspecified trimester: Secondary | ICD-10-CM

## 2017-04-20 LAB — POCT URINE PREGNANCY: PREG TEST UR: POSITIVE — AB

## 2017-04-20 NOTE — Progress Notes (Signed)
Chief complaint: 1.  Positive home pregnancy test 2.  Nausea and vomiting 3.  Breast tenderness 4.  Unsure last menstrual period  Patient presents for confirmation of pregnancy. Took Provera times 10 days from 03/17/2017 to 03/27/2017; had spotting on 03/29/2017 for 1 day. Positive home pregnancy test on 03/27/2017 Recent onset nausea with vomiting; taking Phenergan 1/4 tablet which controls nausea Serum progesterone on 03/08/2017 1.6; serum hCG on 03/08/2017 less than 1. Serum progesterone on 04/13/2017 15.0.  History of miscarriage 1 year ago.  OBJECTIVE: BP 122/75   Pulse 89   Ht 5\' 3"  (1.6 m)   Wt 186 lb 4.8 oz (84.5 kg)   LMP 02/15/2017 (Exact Date)   BMI 33.00 kg/m  Physical exam-deferred  ASSESSMENT: 1.  Positive UPT today 2.  History of miscarriage 3.  Nausea and vomiting, controlled with promethazine 1/4 tablet 4.  Uncertain LMP 5.  No insurance  PLAN: 1.  Ultrasound-first available 2.  Patient will be notified about results and further management planning for new OB intake and new OB physical exam  Herold HarmsMartin A Shanetha Bradham, MD  Note: This dictation was prepared with Dragon dictation along with smaller phrase technology. Any transcriptional errors that result from this process are unintentional.

## 2017-04-20 NOTE — Patient Instructions (Signed)
1.  Ultrasound is scheduled to confirm viable intrauterine pregnancy and EDD 2.  Results will be made available with follow-up appointments to be scheduled. Prenatal Care WHAT IS PRENATAL CARE? Prenatal care is the process of caring for a pregnant woman before she gives birth. Prenatal care makes sure that she and her baby remain as healthy as possible throughout pregnancy. Prenatal care may be provided by a midwife, family practice health care provider, or a childbirth and pregnancy specialist (obstetrician). Prenatal care may include physical examinations, testing, treatments, and education on nutrition, lifestyle, and social support services. WHY IS PRENATAL CARE SO IMPORTANT? Early and consistent prenatal care increases the chance that you and your baby will remain healthy throughout your pregnancy. This type of care also decreases a baby's risk of being born too early (prematurely), or being born smaller than expected (small for gestational age). Any underlying medical conditions you may have that could pose a risk during your pregnancy are discussed during prenatal care visits. You will also be monitored regularly for any new conditions that may arise during your pregnancy so they can be treated quickly and effectively. WHAT HAPPENS DURING PRENATAL CARE VISITS? Prenatal care visits may include the following: Discussion Tell your health care provider about any new signs or symptoms you have experienced since your last visit. These might include:  Nausea or vomiting.  Increased or decreased level of energy.  Difficulty sleeping.  Back or leg pain.  Weight changes.  Frequent urination.  Shortness of breath with physical activity.  Changes in your skin, such as the development of a rash or itchiness.  Vaginal discharge or bleeding.  Feelings of excitement or nervousness.  Changes in your baby's movements.  You may want to write down any questions or topics you want to discuss with  your health care provider and bring them with you to your appointment. Examination During your first prenatal care visit, you will likely have a complete physical exam. Your health care provider will often examine your vagina, cervix, and the position of your uterus, as well as check your heart, lungs, and other body systems. As your pregnancy progresses, your health care provider will measure the size of your uterus and your baby's position inside your uterus. He or she may also examine you for early signs of labor. Your prenatal visits may also include checking your blood pressure and, after about 10-12 weeks of pregnancy, listening to your baby's heartbeat. Testing Regular testing often includes:  Urinalysis. This checks your urine for glucose, protein, or signs of infection.  Blood count. This checks the levels of white and red blood cells in your body.  Tests for sexually transmitted infections (STIs). Testing for STIs at the beginning of pregnancy is routinely done and is required in many states.  Antibody testing. You will be checked to see if you are immune to certain illnesses, such as rubella, that can affect a developing fetus.  Glucose screen. Around 24-28 weeks of pregnancy, your blood glucose level will be checked for signs of gestational diabetes. Follow-up tests may be recommended.  Group B strep. This is a bacteria that is commonly found inside a woman's vagina. This test will inform your health care provider if you need an antibiotic to reduce the amount of this bacteria in your body prior to labor and childbirth.  Ultrasound. Many pregnant women undergo an ultrasound screening around 18-20 weeks of pregnancy to evaluate the health of the fetus and check for any developmental abnormalities.  HIV (  human immunodeficiency virus) testing. Early in your pregnancy, you will be screened for HIV. If you are at high risk for HIV, this test may be repeated during your third trimester of  pregnancy.  You may be offered other testing based on your age, personal or family medical history, or other factors. HOW OFTEN SHOULD I PLAN TO SEE MY HEALTH CARE PROVIDER FOR PRENATAL CARE? Your prenatal care check-up schedule depends on any medical conditions you have before, or develop during, your pregnancy. If you do not have any underlying medical conditions, you will likely be seen for checkups:  Monthly, during the first 6 months of pregnancy.  Twice a month during months 7 and 8 of pregnancy.  Weekly starting in the 9th month of pregnancy and until delivery.  If you develop signs of early labor or other concerning signs or symptoms, you may need to see your health care provider more often. Ask your health care provider what prenatal care schedule is best for you. WHAT CAN I DO TO KEEP MYSELF AND MY BABY AS HEALTHY AS POSSIBLE DURING MY PREGNANCY?  Take a prenatal vitamin containing 400 micrograms (0.4 mg) of folic acid every day. Your health care provider may also ask you to take additional vitamins such as iodine, vitamin D, iron, copper, and zinc.  Take 1500-2000 mg of calcium daily starting at your 20th week of pregnancy until you deliver your baby.  Make sure you are up to date on your vaccinations. Unless directed otherwise by your health care provider: ? You should receive a tetanus, diphtheria, and pertussis (Tdap) vaccination between the 27th and 36th week of your pregnancy, regardless of when your last Tdap immunization occurred. This helps protect your baby from whooping cough (pertussis) after he or she is born. ? You should receive an annual inactivated influenza vaccine (IIV) to help protect you and your baby from influenza. This can be done at any point during your pregnancy.  Eat a well-rounded diet that includes: ? Fresh fruits and vegetables. ? Lean proteins. ? Calcium-rich foods such as milk, yogurt, hard cheeses, and dark, leafy greens. ? Whole grain  breads.  Do noteat seafood high in mercury, including: ? Swordfish. ? Tilefish. ? Shark. ? King mackerel. ? More than 6 oz tuna per week.  Do not eat: ? Raw or undercooked meats or eggs. ? Unpasteurized foods, such as soft cheeses (brie, blue, or feta), juices, and milks. ? Lunch meats. ? Hot dogs that have not been heated until they are steaming.  Drink enough water to keep your urine clear or pale yellow. For many women, this may be 10 or more 8 oz glasses of water each day. Keeping yourself hydrated helps deliver nutrients to your baby and may prevent the start of pre-term uterine contractions.  Do not use any tobacco products including cigarettes, chewing tobacco, or electronic cigarettes. If you need help quitting, ask your health care provider.  Do not drink beverages containing alcohol. No safe level of alcohol consumption during pregnancy has been determined.  Do not use any illegal drugs. These can harm your developing baby or cause a miscarriage.  Ask your health care provider or pharmacist before taking any prescription or over-the-counter medicines, herbs, or supplements.  Limit your caffeine intake to no more than 200 mg per day.  Exercise. Unless told otherwise by your health care provider, try to get 30 minutes of moderate exercise most days of the week. Do not  do high-impact activities, contact sports, or  activities with a high risk of falling, such as horseback riding or downhill skiing.  Get plenty of rest.  Avoid anything that raises your body temperature, such as hot tubs and saunas.  If you own a cat, do not empty its litter box. Bacteria contained in cat feces can cause an infection called toxoplasmosis. This can result in serious harm to the fetus.  Stay away from chemicals such as insecticides, lead, mercury, and cleaning or paint products that contain solvents.  Do not have any X-rays taken unless medically necessary.  Take a childbirth and  breastfeeding preparation class. Ask your health care provider if you need a referral or recommendation.  This information is not intended to replace advice given to you by your health care provider. Make sure you discuss any questions you have with your health care provider. Document Released: 04/22/2003 Document Revised: 09/22/2015 Document Reviewed: 07/04/2013 Elsevier Interactive Patient Education  2017 [employer]

## 2017-04-21 ENCOUNTER — Ambulatory Visit (INDEPENDENT_AMBULATORY_CARE_PROVIDER_SITE_OTHER): Payer: Self-pay

## 2017-04-21 DIAGNOSIS — O09299 Supervision of pregnancy with other poor reproductive or obstetric history, unspecified trimester: Secondary | ICD-10-CM

## 2017-04-22 ENCOUNTER — Telehealth: Payer: Self-pay | Admitting: Obstetrics and Gynecology

## 2017-04-22 NOTE — Telephone Encounter (Signed)
The patient called and stated that she missed a call from SunGardCrystal Miller, No other information was disclosed other than wanting to receive a call back. Please advise.

## 2017-04-22 NOTE — Telephone Encounter (Signed)
Spoke with pt- see u/s report.

## 2017-05-03 NOTE — L&D Delivery Note (Signed)
Delivery Summary for Jillian Anderson  Labor Events:   Preterm labor:   Rupture date: 11/05/2017  Rupture time: 4:44 AM  Rupture type: Artificial  Fluid Color:   Induction:   Augmentation:   Complications:   Cervical ripening:          Delivery:   Episiotomy:   Lacerations:   Repair suture:   Repair # of packets:   Blood loss (ml): 200   Information for the patient's newborn:  Basilio CairoMorrow, Boy Etoy [[id number]][[id number]]    Delivery 11/05/2017 5:45 AM by  Vaginal, Spontaneous Sex:  female Gestational Age: [id number] Delivery Clinician:   Living?:         APGARS  One minute Five minutes Ten minutes  Skin color:        Heart rate:        Grimace:        Muscle tone:        Breathing:        Totals: 8  9      Presentation/position:      Resuscitation:   Cord information:    Disposition of cord blood:     Blood gases sent?  Complications:   Placenta: Delivered:       appearance Newborn Measurements: Weight: 4 lb 10.8 oz (2120 g)  Height: 18.5"  Head circumference:    Chest circumference:    Other providers:    Additional  information: Forceps:   Vacuum:   Breech:   Observed anomalies       Delivery Note At 5:45 AM a viable and healthy female was delivered precipitously via Vaginal, Spontaneous (Presentation: Vertex; ROA position).  APGAR: 8, 9; weight 4 lb 10.8 oz (2120 g).   Placenta status: spontaneously removed, intact.  Cord: 3-vessel with the following complications: none .  Cord pH: not obtained.  Delayed cord clamping observed, > 2 minutes. Cord blood obtained for evaluation.   Anesthesia: nitrous oxide, IV sedation Episiotomy:  None Lacerations:  1st degree perineal Suture Repair: 3.0 vicryl Est. Blood Loss (mL):  200  Mom to postpartum.  Baby to Couplet care / Skin to Skin.  Jillian Anderson 11/05/2017, 6:15 AM

## 2017-05-09 ENCOUNTER — Ambulatory Visit: Payer: Self-pay

## 2017-05-09 VITALS — HR 90 | Ht 62.0 in | Wt 187.8 lb

## 2017-05-09 DIAGNOSIS — Z3A10 10 weeks gestation of pregnancy: Secondary | ICD-10-CM

## 2017-05-09 NOTE — Patient Instructions (Signed)
First Trimester of Pregnancy The first trimester of pregnancy is from week 1 until the end of week 13 (months 1 through 3). During this time, your baby will begin to develop inside you. At 6-8 weeks, the eyes and face are formed, and the heartbeat can be seen on ultrasound. At the end of 12 weeks, all the baby's organs are formed. Prenatal care is all the medical care you receive before the birth of your baby. Make sure you get good prenatal care and follow all of your doctor's instructions. Follow these instructions at home: Medicines  Take over-the-counter and prescription medicines only as told by your doctor. Some medicines are safe and some medicines are not safe during pregnancy.  Take a prenatal vitamin that contains at least 600 micrograms (mcg) of folic acid.  If you have trouble pooping (constipation), take medicine that will make your stool soft (stool softener) if your doctor approves. Eating and drinking  Eat regular, healthy meals.  Your doctor will tell you the amount of weight gain that is right for you.  Avoid raw meat and uncooked cheese.  If you feel sick to your stomach (nauseous) or throw up (vomit): ? Eat 4 or 5 small meals a day instead of 3 large meals. ? Try eating a few soda crackers. ? Drink liquids between meals instead of during meals.  To prevent constipation: ? Eat foods that are high in fiber, like fresh fruits and vegetables, whole grains, and beans. ? Drink enough fluids to keep your pee (urine) clear or pale yellow. Activity  Exercise only as told by your doctor. Stop exercising if you have cramps or pain in your lower belly (abdomen) or low back.  Do not exercise if it is too hot, too humid, or if you are in a place of great height (high altitude).  Try to avoid standing for long periods of time. Move your legs often if you must stand in one place for a long time.  Avoid heavy lifting.  Wear low-heeled shoes. Sit and stand up straight.  You  can have sex unless your doctor tells you not to. Relieving pain and discomfort  Wear a good support bra if your breasts are sore.  Take warm water baths (sitz baths) to soothe pain or discomfort caused by hemorrhoids. Use hemorrhoid cream if your doctor says it is okay.  Rest with your legs raised if you have leg cramps or low back pain.  If you have puffy, bulging veins (varicose veins) in your legs: ? Wear support hose or compression stockings as told by your doctor. ? Raise (elevate) your feet for 15 minutes, 3-4 times a day. ? Limit salt in your food. Prenatal care  Schedule your prenatal visits by the twelfth week of pregnancy.  Write down your questions. Take them to your prenatal visits.  Keep all your prenatal visits as told by your doctor. This is important. Safety  Wear your seat belt at all times when driving.  Make a list of emergency phone numbers. The list should include numbers for family, friends, the hospital, and police and fire departments. General instructions  Ask your doctor for a referral to a local prenatal class. Begin classes no later than at the start of month 6 of your pregnancy.  Ask for help if you need counseling or if you need help with nutrition. Your doctor can give you advice or tell you where to go for help.  Do not use hot tubs, steam rooms, or   saunas.  Do not douche or use tampons or scented sanitary pads.  Do not cross your legs for long periods of time.  Avoid all herbs and alcohol. Avoid drugs that are not approved by your doctor.  Do not use any tobacco products, including cigarettes, chewing tobacco, and electronic cigarettes. If you need help quitting, ask your doctor. You may get counseling or other support to help you quit.  Avoid cat litter boxes and soil used by cats. These carry germs that can cause birth defects in the baby and can cause a loss of your baby (miscarriage) or stillbirth.  Visit your dentist. At home, brush  your teeth with a soft toothbrush. Be gentle when you floss. Contact a doctor if:  You are dizzy.  You have mild cramps or pressure in your lower belly.  You have a nagging pain in your belly area.  You continue to feel sick to your stomach, you throw up, or you have watery poop (diarrhea).  You have a bad smelling fluid coming from your vagina.  You have pain when you pee (urinate).  You have increased puffiness (swelling) in your face, hands, legs, or ankles. Get help right away if:  You have a fever.  You are leaking fluid from your vagina.  You have spotting or bleeding from your vagina.  You have very bad belly cramping or pain.  You gain or lose weight rapidly.  You throw up blood. It may look like coffee grounds.  You are around people who have German measles, fifth disease, or chickenpox.  You have a very bad headache.  You have shortness of breath.  You have any kind of trauma, such as from a fall or a car accident. Summary  The first trimester of pregnancy is from week 1 until the end of week 13 (months 1 through 3).  To take care of yourself and your unborn baby, you will need to eat healthy meals, take medicines only if your doctor tells you to do so, and do activities that are safe for you and your baby.  Keep all follow-up visits as told by your doctor. This is important as your doctor will have to ensure that your baby is healthy and growing well. This information is not intended to replace advice given to you by your health care provider. Make sure you discuss any questions you have with your health care provider. Document Released: 10/06/2007 Document Revised: 04/27/2016 Document Reviewed: 04/27/2016 Elsevier Interactive Patient Education  2017 Elsevier Inc.  

## 2017-05-09 NOTE — Progress Notes (Unsigned)
[name] presents for NOB nurse interview visit. Pregnancy confirmation done  [facility]____.  G- .2  P-    . Pregnancy education material explained and given. No cats in the home. NOB labs ordered. (TSH/HbgA1c due to Increased BMI), . HIV labs and Drug screen were explained optional and she did not decline. Drug screen ordered. PNV encouraged. Genetic screening options discussed. Genetic testing:  Pt may discuss with provider she has appointment already with Dr. Logan BoresEvans

## 2017-05-10 LAB — CBC WITH DIFFERENTIAL/PLATELET
BASOS ABS: 0 10*3/uL (ref 0.0–0.2)
Basos: 0 %
EOS (ABSOLUTE): 0.1 10*3/uL (ref 0.0–0.4)
Eos: 1 %
Hematocrit: 40.2 % (ref 34.0–46.6)
Hemoglobin: 13.1 g/dL (ref 11.1–15.9)
Immature Grans (Abs): 0 10*3/uL (ref 0.0–0.1)
Immature Granulocytes: 0 %
LYMPHS ABS: 2.9 10*3/uL (ref 0.7–3.1)
Lymphs: 34 %
MCH: 29.8 pg (ref 26.6–33.0)
MCHC: 32.6 g/dL (ref 31.5–35.7)
MCV: 92 fL (ref 79–97)
Monocytes Absolute: 0.5 10*3/uL (ref 0.1–0.9)
Monocytes: 6 %
NEUTROS ABS: 5 10*3/uL (ref 1.4–7.0)
Neutrophils: 59 %
PLATELETS: 283 10*3/uL (ref 150–379)
RBC: 4.39 x10E6/uL (ref 3.77–5.28)
RDW: 12.8 % (ref 12.3–15.4)
WBC: 8.5 10*3/uL (ref 3.4–10.8)

## 2017-05-10 LAB — URINALYSIS, ROUTINE W REFLEX MICROSCOPIC
Bilirubin, UA: NEGATIVE
Glucose, UA: NEGATIVE
KETONES UA: NEGATIVE
Leukocytes, UA: NEGATIVE
NITRITE UA: NEGATIVE
Protein, UA: NEGATIVE
RBC, UA: NEGATIVE
UUROB: 0.2 mg/dL (ref 0.2–1.0)
pH, UA: 6 (ref 5.0–7.5)

## 2017-05-10 LAB — HIV ANTIBODY (ROUTINE TESTING W REFLEX): HIV Screen 4th Generation wRfx: NONREACTIVE

## 2017-05-10 LAB — VARICELLA ZOSTER ANTIBODY, IGG: VARICELLA: 685 {index} (ref >165)

## 2017-05-10 LAB — RUBELLA SCREEN: Rubella Antibodies, IGG: 4.3 index (ref >0.99)

## 2017-05-10 LAB — HEPATITIS B SURFACE ANTIGEN: HEP B S AG: NEGATIVE

## 2017-05-10 LAB — RPR: RPR Ser Ql: NONREACTIVE

## 2017-05-10 LAB — ANTIBODY SCREEN: ANTIBODY SCREEN: NEGATIVE

## 2017-05-11 LAB — GC/CHLAMYDIA PROBE AMP
CHLAMYDIA, DNA PROBE: NEGATIVE
NEISSERIA GONORRHOEAE BY PCR: NEGATIVE

## 2017-05-11 LAB — URINE CULTURE: Organism ID, Bacteria: NO GROWTH

## 2017-05-13 LAB — MONITOR DRUG PROFILE 14(MW)
Amphetamine Scrn, Ur: NEGATIVE ng/mL
BARBITURATE SCREEN URINE: NEGATIVE ng/mL
BENZODIAZEPINE SCREEN, URINE: NEGATIVE ng/mL
BUPRENORPHINE, URINE: NEGATIVE ng/mL
CANNABINOIDS UR QL SCN: NEGATIVE ng/mL
COCAINE(METAB.)SCREEN, URINE: NEGATIVE ng/mL
CREATININE(CRT), U: 235.7 mg/dL (ref 20.0–300.0)
Fentanyl, Urine: NEGATIVE pg/mL
MEPERIDINE SCREEN, URINE: NEGATIVE ng/mL
METHADONE SCREEN, URINE: NEGATIVE ng/mL
OXYCODONE+OXYMORPHONE UR QL SCN: NEGATIVE ng/mL
Ph of Urine: 5.8 (ref 4.5–8.9)
Phencyclidine Qn, Ur: NEGATIVE ng/mL
Propoxyphene Scrn, Ur: NEGATIVE ng/mL
SPECIFIC GRAVITY: 1.031
Tramadol Screen, Urine: NEGATIVE ng/mL

## 2017-05-13 LAB — OPIATES CONFIRMATION, URINE: OPIATES: NEGATIVE ng/mL

## 2017-05-20 ENCOUNTER — Ambulatory Visit: Payer: Self-pay | Admitting: Obstetrics and Gynecology

## 2017-05-20 ENCOUNTER — Encounter: Payer: Self-pay | Admitting: Obstetrics and Gynecology

## 2017-05-20 VITALS — BP 105/68 | HR 85 | Wt 191.1 lb

## 2017-05-20 LAB — POCT URINALYSIS DIPSTICK
BILIRUBIN UA: NEGATIVE
Blood, UA: NEGATIVE
GLUCOSE UA: NEGATIVE
Ketones, UA: NEGATIVE
LEUKOCYTES UA: NEGATIVE
Nitrite, UA: NEGATIVE
PH UA: 7 (ref 5.0–8.0)
SPEC GRAV UA: 1.015 (ref 1.010–1.025)
UROBILINOGEN UA: 0.2 U/dL

## 2017-05-26 ENCOUNTER — Ambulatory Visit: Payer: Self-pay | Admitting: Obstetrics and Gynecology

## 2017-05-26 ENCOUNTER — Encounter: Payer: Self-pay | Admitting: Obstetrics and Gynecology

## 2017-05-26 VITALS — BP 103/68 | HR 94 | Wt 190.2 lb

## 2017-05-26 DIAGNOSIS — Z3481 Encounter for supervision of other normal pregnancy, first trimester: Secondary | ICD-10-CM

## 2017-05-26 LAB — POCT URINALYSIS DIPSTICK
Bilirubin, UA: NEGATIVE
Blood, UA: NEGATIVE
GLUCOSE UA: NEGATIVE
Ketones, UA: NEGATIVE
NITRITE UA: NEGATIVE
PROTEIN UA: NEGATIVE
Spec Grav, UA: 1.01 (ref 1.010–1.025)
Urobilinogen, UA: 0.2 E.U./dL
pH, UA: 7.5 (ref 5.0–8.0)

## 2017-05-26 NOTE — Progress Notes (Signed)
ROB- Pt states she has been feeling nauseous, she states she does currently take phernergan, states it does give her some relief but makes her drowsy. Pt would like to discuss metoprolol, would like to discuss if it is ok for her to stop, states she feels like she is doing well   NOB visit.     HPI:      [name] is a 32 y.o. G2P0010 who LMP was Patient's last menstrual period was 02/24/2017.  Subjective:   She presents today at approximately 5313 weeks gestational age.  She has occasional nausea but no vomiting.  She is controlling this well with diet.  She continues to take prenatal vitamins.  She has no other complaints.     Hx: The following portions of the patient's history were reviewed and updated as appropriate:             She  has a past medical history of Allergy, Asthma without status asthmaticus (03/15/2016), Astigmatism, GERD (gastroesophageal reflux disease), History of kidney stones, Migraines, PCOS (polycystic ovarian syndrome), Polycystic disease, ovaries, Tachycardia, and Tachycardia. She does not have any pertinent problems on file. She  has a past surgical history that includes Wisdom tooth extraction and Dilation and evacuation (N/A, 04/23/2016). Her family history includes Asthma in her brother; Diabetes in her father; Hyperlipidemia in her father; Hypertension in her mother. She  reports that  has never smoked. she has never used smokeless tobacco. She reports that she drinks about 0.6 oz of alcohol per week. She reports that she does not use drugs. She is allergic to ceclor [cefaclor]; codeine; red dye; blackberry flavor; and raspberry.       Review of Systems:  Review of Systems  Constitutional: Denied constitutional symptoms, night sweats, recent illness, fatigue, fever, insomnia and weight loss.  Eyes: Denied eye symptoms, eye pain, photophobia, vision change and visual disturbance.  Ears/Nose/Throat/Neck: Denied ear, nose, throat or neck symptoms,  hearing loss, nasal discharge, sinus congestion and sore throat.  Cardiovascular: Denied cardiovascular symptoms, arrhythmia, chest pain/pressure, edema, exercise intolerance, orthopnea and palpitations.  Respiratory: Denied pulmonary symptoms, asthma, pleuritic pain, productive sputum, cough, dyspnea and wheezing.  Gastrointestinal: Denied, gastro-esophageal reflux, melena, nausea and vomiting.  Genitourinary: Denied genitourinary symptoms including symptomatic vaginal discharge, pelvic relaxation issues, and urinary complaints.  Musculoskeletal: Denied musculoskeletal symptoms, stiffness, swelling, muscle weakness and myalgia.  Dermatologic: Denied dermatology symptoms, rash and scar.  Neurologic: Denied neurology symptoms, dizziness, headache, neck pain and syncope.  Psychiatric: Denied psychiatric symptoms, anxiety and depression.  Endocrine: Denied endocrine symptoms including hot flashes and night sweats.   Meds:   Current Outpatient Medications on File Prior to Visit  Medication Sig Dispense Refill  . METOPROLOL TARTRATE PO Take 25 mg by mouth.    . Prenatal Vit-Fe Fumarate-FA (PRENATAL MULTIVITAMIN) TABS tablet Take 1 tablet by mouth daily at 12 noon.    . Probiotic Product (PROBIOTIC-10 PO) Take by mouth.    . ranitidine (ZANTAC) 150 MG capsule Take 150 mg by mouth 2 (two) times daily.    . [DISCONTINUED] potassium chloride SA (K-DUR,KLOR-CON) 20 MEQ tablet Take 1 tablet (20 mEq total) by mouth daily. X 3 day, repeat labs next week 3 tablet 0   No current facility-administered medications on file prior to visit.     Objective:     Vitals:   05/26/17 1501  BP: 103/68  Pulse: 94              Physical examination General  NAD, Conversant  HEENT Atraumatic; Op clear with mmm.  Normo-cephalic. Pupils reactive. Anicteric sclerae  Thyroid/Neck Smooth without nodularity or enlargement. Normal ROM.  Neck Supple.  Skin No rashes, lesions or ulceration. Normal palpated skin turgor.  No nodularity.  Breasts: No masses or discharge.  Symmetric.  No axillary adenopathy.  Lungs: Clear to auscultation.No rales or wheezes. Normal Respiratory effort, no retractions.  Heart: NSR.  No murmurs or rubs appreciated. No periferal edema  Abdomen: Soft.  Non-tender.  No masses.  No HSM. No hernia  Extremities: Moves all appropriately.  Normal ROM for age. No lymphadenopathy.  Neuro: Oriented to PPT.  Normal mood. Normal affect.     Pelvic:   Vulva: Normal appearance.  No lesions.  Vagina: No lesions or abnormalities noted.  Support: Normal pelvic support.  Urethra No masses tenderness or scarring.  Meatus Normal size without lesions or prolapse.  Cervix: Normal appearance.  No lesions.  Friable  Anus: Normal exam.  No lesions.  Perineum: Normal exam.  No lesions.        Bimanual   Adnexae: No masses.  Non-tender to palpation.  Uterus: Enlarged. 13 wks  POS FHT's  Non-tender.  Mobile.  AV.  Adnexae: No masses.  Non-tender to palpation.  Cul-de-sac: Negative for abnormality.  Adnexae: No masses.  Non-tender to palpation.         Pelvimetry   Diagonal: Reached.  Spines: Average.  Sacrum: Concave.  Pubic Arch: Normal.      Assessment:    G2P0010 Patient Active Problem List   Diagnosis Date Noted  . Nausea and vomiting during pregnancy 04/20/2017  . Unsure of LMP (last menstrual period) as reason for ultrasound scan 04/20/2017  . Positive urine pregnancy test 04/20/2017  . History of miscarriage, currently pregnant 04/20/2017  . Ear pain 06/04/2016  . Hyperlipidemia 06/04/2016  . Embryonic demise 05/13/2016  . Status post D&C 05/13/2016  . Asthma without status asthmaticus 03/15/2016  . Polycystic ovarian syndrome 03/15/2016  . Obesity (BMI 30.0-34.9) 03/15/2016  . Mild intermittent asthma without complication 03/15/2016  . Folliculitis 09/12/2015  . Dermatitis 09/12/2015  . GERD (gastroesophageal reflux disease) 08/01/2013  . Dysmenorrhea 09/22/2012  .  Nephrolithiasis 09/03/2011  . Palpitations 07/08/2011  . IBS (irritable bowel syndrome) 07/08/2011  . Migraines 01/06/2011  . Allergic rhinitis 01/06/2011     1. Encounter for supervision of other normal pregnancy in first trimester     Patient just completing first trimester.  Generally doing well.   Plan:            1.  We have discussed MaterniT 21 testing and quad screen testing.  Patient is self-pay and is not sure if she wants neither of these tests performed.  She will tell us at her next visit.  2.  Pap GC/CT performed. Orders Orders Placed This Encounter  Procedures  . POCT urinalysis dipstick    No orders of the defined types were placed in this encounter.     F/U  Return in about 4 weeks (around 06/23/2017).  Elonda Husky, M.D. 05/26/2017 5:04 PM

## 2017-05-30 LAB — PAP IG, CT-NG, RFX HPV ASCU
Chlamydia, Nuc. Acid Amp: NEGATIVE
Gonococcus by Nucleic Acid Amp: NEGATIVE
PAP Smear Comment: 0

## 2017-06-24 ENCOUNTER — Ambulatory Visit (INDEPENDENT_AMBULATORY_CARE_PROVIDER_SITE_OTHER): Payer: Self-pay | Admitting: Obstetrics and Gynecology

## 2017-06-24 ENCOUNTER — Other Ambulatory Visit: Payer: Self-pay

## 2017-06-24 VITALS — BP 115/73 | HR 89 | Wt 190.2 lb

## 2017-06-24 DIAGNOSIS — Z3482 Encounter for supervision of other normal pregnancy, second trimester: Secondary | ICD-10-CM

## 2017-06-24 DIAGNOSIS — Z23 Encounter for immunization: Secondary | ICD-10-CM

## 2017-06-24 DIAGNOSIS — K5900 Constipation, unspecified: Secondary | ICD-10-CM

## 2017-06-24 DIAGNOSIS — Z1379 Encounter for other screening for genetic and chromosomal anomalies: Secondary | ICD-10-CM

## 2017-06-24 LAB — POCT URINALYSIS DIPSTICK
BILIRUBIN UA: NEGATIVE
Blood, UA: NEGATIVE
Glucose, UA: NEGATIVE
KETONES UA: NEGATIVE
Leukocytes, UA: NEGATIVE
Nitrite, UA: NEGATIVE
Protein, UA: NEGATIVE
Spec Grav, UA: 1.015 (ref 1.010–1.025)
Urobilinogen, UA: 0.2 E.U./dL
pH, UA: 7 (ref 5.0–8.0)

## 2017-06-24 NOTE — Progress Notes (Signed)
Pt is constipated and haven't been to the bathroom in 7 days. Pt stated that doing a bowel movement she has back and stomach pain along with the stool being hard.Pt do not want to take stool softeners anymore. Wants to discuss other forms of fiber.

## 2017-06-24 NOTE — Progress Notes (Signed)
ROB: Constipation, has been ongoing, using stool softeners with no relief. Will prescribe Miralax, advised on prune/apple juice, increasing fiber.  Does desire quad screen, ordered.  For flu vaccine today. RTC in 4 weeks. For anatomy scan in 3 weeks.

## 2017-06-28 LAB — AFP TETRA
DIA MOM VALUE: 1.45
DIA Value (EIA): 210.75 pg/mL
DSR (BY AGE) 1 IN: 732
DSR (Second Trimester) 1 IN: 544
Gestational Age: 17.1 WEEKS
MATERNAL AGE AT EDD: 29.6 a
MSAFP Mom: 0.85
MSAFP: 27.3 ng/mL
MSHCG Mom: 1.84
MSHCG: 48853 m[IU]/mL
OSB RISK: 10000
T18 (By Age): 1:2853 {titer}
TEST RESULTS AFP: NEGATIVE
UE3 VALUE: 0.8 ng/mL
Weight: 190 [lb_av]
uE3 Mom: 0.8

## 2017-07-15 ENCOUNTER — Other Ambulatory Visit: Payer: Self-pay

## 2017-07-15 ENCOUNTER — Ambulatory Visit (INDEPENDENT_AMBULATORY_CARE_PROVIDER_SITE_OTHER): Payer: Self-pay

## 2017-07-15 DIAGNOSIS — Z3482 Encounter for supervision of other normal pregnancy, second trimester: Secondary | ICD-10-CM

## 2017-07-20 ENCOUNTER — Encounter: Payer: Self-pay | Admitting: Obstetrics and Gynecology

## 2017-07-20 ENCOUNTER — Ambulatory Visit (INDEPENDENT_AMBULATORY_CARE_PROVIDER_SITE_OTHER): Payer: Self-pay | Admitting: Obstetrics and Gynecology

## 2017-07-20 VITALS — BP 126/81 | HR 89 | Wt 188.4 lb

## 2017-07-20 DIAGNOSIS — K5900 Constipation, unspecified: Secondary | ICD-10-CM

## 2017-07-20 DIAGNOSIS — Z3482 Encounter for supervision of other normal pregnancy, second trimester: Secondary | ICD-10-CM

## 2017-07-20 LAB — POCT URINALYSIS DIPSTICK
Bilirubin, UA: NEGATIVE
Glucose, UA: NEGATIVE
KETONES UA: NEGATIVE
LEUKOCYTES UA: NEGATIVE
NITRITE UA: NEGATIVE
PROTEIN UA: NEGATIVE
RBC UA: NEGATIVE
SPEC GRAV UA: 1.015 (ref 1.010–1.025)
Urobilinogen, UA: 0.2 E.U./dL
pH, UA: 6.5 (ref 5.0–8.0)

## 2017-07-20 NOTE — Progress Notes (Signed)
ROB: Normal anatomy scan last week.  Patient reports normal fetal movement.  Complains of difficulty sleeping.  Is still experiencing difficulty with constipation-I offered referral to GI-patient declined she would like to try MiraLAX first.

## 2017-08-17 ENCOUNTER — Encounter: Payer: Self-pay | Admitting: Obstetrics and Gynecology

## 2017-08-17 ENCOUNTER — Ambulatory Visit (INDEPENDENT_AMBULATORY_CARE_PROVIDER_SITE_OTHER): Payer: Self-pay | Admitting: Obstetrics and Gynecology

## 2017-08-17 VITALS — BP 119/79 | HR 96 | Wt 194.1 lb

## 2017-08-17 DIAGNOSIS — Z3482 Encounter for supervision of other normal pregnancy, second trimester: Secondary | ICD-10-CM

## 2017-08-17 LAB — POCT URINALYSIS DIPSTICK
Bilirubin, UA: NEGATIVE
Glucose, UA: NEGATIVE
Ketones, UA: NEGATIVE
LEUKOCYTES UA: NEGATIVE
NITRITE UA: NEGATIVE
PROTEIN UA: NEGATIVE
RBC UA: NEGATIVE
SPEC GRAV UA: 1.01 (ref 1.010–1.025)
UROBILINOGEN UA: 0.2 U/dL
pH, UA: 7.5 (ref 5.0–8.0)

## 2017-08-17 NOTE — Progress Notes (Signed)
ROB: Occasional leg cramps-discussed in detail.  Patient otherwise well.  1 hour GCT next visit.

## 2017-08-17 NOTE — Progress Notes (Signed)
ROB-pt is having ready bad leg cramps at bedtime wakes her up.

## 2017-08-24 ENCOUNTER — Ambulatory Visit (INDEPENDENT_AMBULATORY_CARE_PROVIDER_SITE_OTHER): Payer: Self-pay | Admitting: Obstetrics and Gynecology

## 2017-08-24 ENCOUNTER — Encounter: Payer: Self-pay | Admitting: Obstetrics and Gynecology

## 2017-08-24 VITALS — BP 127/80 | HR 102 | Temp 98.3°F | Wt 192.2 lb

## 2017-08-24 DIAGNOSIS — N3001 Acute cystitis with hematuria: Secondary | ICD-10-CM

## 2017-08-24 DIAGNOSIS — Z3482 Encounter for supervision of other normal pregnancy, second trimester: Secondary | ICD-10-CM

## 2017-08-24 DIAGNOSIS — R300 Dysuria: Secondary | ICD-10-CM

## 2017-08-24 LAB — POCT URINALYSIS DIPSTICK
Bilirubin, UA: NEGATIVE
Glucose, UA: NEGATIVE
Ketones, UA: NEGATIVE
NITRITE UA: NEGATIVE
Odor: NEGATIVE
SPEC GRAV UA: 1.015 (ref 1.010–1.025)
Urobilinogen, UA: 0.2 E.U./dL
pH, UA: 7 (ref 5.0–8.0)

## 2017-08-24 NOTE — Progress Notes (Signed)
OB w/i- pain with urination, abd and back pain x 2days. Hematuria.

## 2017-08-24 NOTE — Progress Notes (Signed)
ROB: Patient with UTI symptoms and hematuria.  Urine sent for C&S.  Will treat with Macrobid for 1 week. Patient otherwise doing well.  1 hour GCT next visit.

## 2017-08-25 ENCOUNTER — Telehealth: Payer: Self-pay | Admitting: Obstetrics and Gynecology

## 2017-08-25 MED ORDER — NITROFURANTOIN MONOHYD MACRO 100 MG PO CAPS: 100.0000 mg | ORAL_CAPSULE | Freq: Two times a day (BID) | ORAL | 1 refills | Status: DC

## 2017-08-25 NOTE — Telephone Encounter (Signed)
Pt aware DJE has sent in atb.

## 2017-08-25 NOTE — Addendum Note (Signed)
Addended by: Elonda HuskyEVANS, DAVID J on: 08/25/2017 10:13 AM   Modules accepted: Orders

## 2017-08-25 NOTE — Telephone Encounter (Signed)
The patient called and stated that she would like to speak with her nurse in regards to her Bladder infection medication not being sent into her pharmacy ([facility] in [place] Please advise.

## 2017-08-26 LAB — URINE CULTURE

## 2017-08-31 ENCOUNTER — Encounter: Payer: Self-pay | Admitting: Obstetrics and Gynecology

## 2017-09-14 ENCOUNTER — Ambulatory Visit (INDEPENDENT_AMBULATORY_CARE_PROVIDER_SITE_OTHER): Payer: Self-pay | Admitting: Obstetrics and Gynecology

## 2017-09-14 ENCOUNTER — Encounter: Payer: Self-pay | Admitting: Obstetrics and Gynecology

## 2017-09-14 ENCOUNTER — Other Ambulatory Visit: Payer: Self-pay

## 2017-09-14 VITALS — BP 121/75 | HR 106 | Wt 192.8 lb

## 2017-09-14 DIAGNOSIS — O99210 Obesity complicating pregnancy, unspecified trimester: Secondary | ICD-10-CM

## 2017-09-14 DIAGNOSIS — O2613 Low weight gain in pregnancy, third trimester: Secondary | ICD-10-CM

## 2017-09-14 DIAGNOSIS — Z3483 Encounter for supervision of other normal pregnancy, third trimester: Secondary | ICD-10-CM

## 2017-09-14 DIAGNOSIS — Z23 Encounter for immunization: Secondary | ICD-10-CM

## 2017-09-14 LAB — POCT URINALYSIS DIPSTICK
BILIRUBIN UA: NEGATIVE
GLUCOSE UA: NEGATIVE
Ketones, UA: NEGATIVE
Leukocytes, UA: NEGATIVE
Nitrite, UA: NEGATIVE
PH UA: 7.5 (ref 5.0–8.0)
RBC UA: NEGATIVE
Spec Grav, UA: 1.01 (ref 1.010–1.025)
UROBILINOGEN UA: 0.2 U/dL

## 2017-09-14 MED ORDER — TETANUS-DIPHTH-ACELL PERTUSSIS 5-2.5-18.5 LF-MCG/0.5 IM SUSP
0.5000 mL | Freq: Once | INTRAMUSCULAR | Status: AC
  Administered 2017-09-14: 0.5 mL via INTRAMUSCULAR

## 2017-09-14 NOTE — Progress Notes (Signed)
ROB-pt is doing well. Pt stated that she passed 2 kidney stones on 08/29/17

## 2017-09-14 NOTE — Patient Instructions (Signed)

## 2017-09-14 NOTE — Progress Notes (Signed)
ROB: Patient doing well. Notes passing kidney stones several weeks ago.  For 28 week labs today.  Desires to breastfeed,  desires unsure method for contraception but would prefer a non-hormonal method (given handout on options). For Tdap today, signed blood consent, discussed cord blood banking. Discussed poor weight gain in pregnancy, however fetal growth appears appropriate by FH. Discussed increasing dietary protein, can supplement with nutritional shakes if needed. Overall total weight gain should not exceed 25-30 lbs based on pre-pregnancy BMI. RTC in 2 weeks.

## 2017-09-15 LAB — CBC
HEMATOCRIT: 32.9 % — AB (ref 34.0–46.6)
HEMOGLOBIN: 10.6 g/dL — AB (ref 11.1–15.9)
MCH: 30.1 pg (ref 26.6–33.0)
MCHC: 32.2 g/dL (ref 31.5–35.7)
MCV: 94 fL (ref 79–97)
Platelets: 216 10*3/uL (ref 150–379)
RBC: 3.52 x10E6/uL — AB (ref 3.77–5.28)
RDW: 14 % (ref 12.3–15.4)
WBC: 10.7 10*3/uL (ref 3.4–10.8)

## 2017-09-15 LAB — GLUCOSE, 1 HOUR GESTATIONAL: Gestational Diabetes Screen: 152 mg/dL — ABNORMAL HIGH (ref 65–139)

## 2017-09-15 LAB — RPR: RPR: NONREACTIVE

## 2017-09-21 ENCOUNTER — Other Ambulatory Visit: Payer: Self-pay

## 2017-09-21 DIAGNOSIS — R7309 Other abnormal glucose: Secondary | ICD-10-CM

## 2017-09-22 LAB — GESTATIONAL GLUCOSE TOLERANCE
GLUCOSE 1 HOUR GTT: 170 mg/dL (ref 65–179)
GLUCOSE 2 HOUR GTT: 152 mg/dL (ref 65–154)
GLUCOSE 3 HOUR GTT: 132 mg/dL (ref 65–139)
Glucose, Fasting: 78 mg/dL (ref 65–94)

## 2017-09-27 ENCOUNTER — Other Ambulatory Visit: Payer: Self-pay

## 2017-09-27 NOTE — Telephone Encounter (Signed)
Pt was called to see if she could come in tomorrow at 1:45pm instead of 3:30pm

## 2017-09-28 ENCOUNTER — Encounter: Payer: Self-pay | Admitting: Obstetrics and Gynecology

## 2017-09-28 ENCOUNTER — Ambulatory Visit (INDEPENDENT_AMBULATORY_CARE_PROVIDER_SITE_OTHER): Payer: Self-pay | Admitting: Obstetrics and Gynecology

## 2017-09-28 ENCOUNTER — Other Ambulatory Visit: Payer: Self-pay | Admitting: Obstetrics and Gynecology

## 2017-09-28 VITALS — BP 137/86 | HR 101 | Wt 192.2 lb

## 2017-09-28 DIAGNOSIS — O133 Gestational [pregnancy-induced] hypertension without significant proteinuria, third trimester: Secondary | ICD-10-CM

## 2017-09-28 DIAGNOSIS — O2613 Low weight gain in pregnancy, third trimester: Secondary | ICD-10-CM

## 2017-09-28 DIAGNOSIS — O163 Unspecified maternal hypertension, third trimester: Secondary | ICD-10-CM

## 2017-09-28 DIAGNOSIS — Z3483 Encounter for supervision of other normal pregnancy, third trimester: Secondary | ICD-10-CM

## 2017-09-28 LAB — POCT URINALYSIS DIPSTICK
BILIRUBIN UA: NEGATIVE
Glucose, UA: NEGATIVE
KETONES UA: NEGATIVE
Leukocytes, UA: NEGATIVE
NITRITE UA: NEGATIVE
PH UA: 7.5 (ref 5.0–8.0)
PROTEIN UA: NEGATIVE
RBC UA: NEGATIVE
Spec Grav, UA: 1.01 (ref 1.010–1.025)
UROBILINOGEN UA: 0.2 U/dL

## 2017-09-28 NOTE — Progress Notes (Signed)
ROB: Patient without complaint.  Repeat blood pressures equal 142/95, 143/92.  PIH without proteinuria.  Poor weight gain in pregnancy noted.  I will order an ultrasound for fetal growth and AFI.  Close follow-up of hypertension in 2 days.  Consider close follow-up with NSTs as needed.

## 2017-09-28 NOTE — Progress Notes (Signed)
ROB pt is doing well no concerns.  

## 2017-09-29 ENCOUNTER — Ambulatory Visit (INDEPENDENT_AMBULATORY_CARE_PROVIDER_SITE_OTHER): Payer: Self-pay

## 2017-09-29 DIAGNOSIS — O2613 Low weight gain in pregnancy, third trimester: Secondary | ICD-10-CM

## 2017-09-29 DIAGNOSIS — O163 Unspecified maternal hypertension, third trimester: Secondary | ICD-10-CM

## 2017-09-30 ENCOUNTER — Encounter: Payer: Self-pay | Admitting: Obstetrics and Gynecology

## 2017-09-30 ENCOUNTER — Ambulatory Visit (INDEPENDENT_AMBULATORY_CARE_PROVIDER_SITE_OTHER): Payer: Self-pay | Admitting: Obstetrics and Gynecology

## 2017-09-30 VITALS — BP 141/92 | HR 86 | Ht 62.0 in | Wt 192.8 lb

## 2017-09-30 DIAGNOSIS — O133 Gestational [pregnancy-induced] hypertension without significant proteinuria, third trimester: Secondary | ICD-10-CM

## 2017-09-30 NOTE — Progress Notes (Signed)
Patient here for blood pressure check and follow-up of pregnancy-induced hypertension.  Also had an ultrasound yesterday for growth. She feels well has no complaints reports active fetal movement.  Denies other symptoms of preeclampsia including headache blurred vision right upper quadrant pain.  Blood pressure today remains elevated but not severe range.  Ultrasound reveals 43rd percentile for growth normal amniotic fluid. Lower extremity reflexes +3 right +2 left no clonus  Pregnancy-induced hypertension without severe features  PIH labs today close follow-up -Tuesday Signs and symptoms of pregnancy-induced hypertension and preeclampsia discussed.     I spent 11 minutes involved in the care of this patient of which greater than 50% was spent discussing pregnancy-induced hypertension, signs and symptoms of preeclampsia, future work-up and management during pregnancy.

## 2017-10-01 LAB — HEPATIC FUNCTION PANEL
ALT: 10 IU/L (ref 0–32)
AST: 10 IU/L (ref 0–40)
Albumin: 3.4 g/dL — ABNORMAL LOW (ref 3.5–5.5)
Alkaline Phosphatase: 84 IU/L (ref 39–117)
BILIRUBIN, DIRECT: 0.04 mg/dL (ref 0.00–0.40)
Total Protein: 6 g/dL (ref 6.0–8.5)

## 2017-10-01 LAB — CREATININE, SERUM
Creatinine, Ser: 0.66 mg/dL (ref 0.57–1.00)
GFR, EST AFRICAN AMERICAN: 138 mL/min/{1.73_m2} (ref >59)
GFR, EST NON AFRICAN AMERICAN: 120 mL/min/{1.73_m2} (ref >59)

## 2017-10-01 LAB — PROTEIN / CREATININE RATIO, URINE
Creatinine, Urine: 82.4 mg/dL
PROTEIN/CREAT RATIO: 181 mg/g{creat} (ref 0–200)
Protein, Ur: 14.9 mg/dL

## 2017-10-01 LAB — CBC WITH DIFFERENTIAL/PLATELET
BASOS: 0 %
Basophils Absolute: 0 10*3/uL (ref 0.0–0.2)
EOS (ABSOLUTE): 0 10*3/uL (ref 0.0–0.4)
EOS: 0 %
HEMATOCRIT: 30.4 % — AB (ref 34.0–46.6)
Hemoglobin: 10.1 g/dL — ABNORMAL LOW (ref 11.1–15.9)
IMMATURE GRANS (ABS): 0 10*3/uL (ref 0.0–0.1)
IMMATURE GRANULOCYTES: 0 %
Lymphocytes Absolute: 2.2 10*3/uL (ref 0.7–3.1)
Lymphs: 23 %
MCH: 30.3 pg (ref 26.6–33.0)
MCHC: 33.2 g/dL (ref 31.5–35.7)
MCV: 91 fL (ref 79–97)
MONOS ABS: 0.5 10*3/uL (ref 0.1–0.9)
Monocytes: 5 %
NEUTROS ABS: 6.7 10*3/uL (ref 1.4–7.0)
NEUTROS PCT: 72 %
PLATELETS: 195 10*3/uL (ref 150–450)
RBC: 3.33 x10E6/uL — ABNORMAL LOW (ref 3.77–5.28)
RDW: 14.3 % (ref 12.3–15.4)
WBC: 9.5 10*3/uL (ref 3.4–10.8)

## 2017-10-01 LAB — URIC ACID: URIC ACID: 3.9 mg/dL (ref 2.5–7.1)

## 2017-10-04 ENCOUNTER — Encounter: Payer: Self-pay | Admitting: Obstetrics and Gynecology

## 2017-10-04 ENCOUNTER — Ambulatory Visit (INDEPENDENT_AMBULATORY_CARE_PROVIDER_SITE_OTHER): Payer: Self-pay | Admitting: Obstetrics and Gynecology

## 2017-10-04 VITALS — BP 122/82 | HR 93 | Wt 195.1 lb

## 2017-10-04 DIAGNOSIS — Z3483 Encounter for supervision of other normal pregnancy, third trimester: Secondary | ICD-10-CM

## 2017-10-04 LAB — POCT URINALYSIS DIPSTICK
Bilirubin, UA: NEGATIVE
Blood, UA: NEGATIVE
Glucose, UA: NEGATIVE
KETONES UA: NEGATIVE
Leukocytes, UA: NEGATIVE
NITRITE UA: NEGATIVE
PROTEIN UA: POSITIVE — AB
Spec Grav, UA: 1.01 (ref 1.010–1.025)
Urobilinogen, UA: 0.2 E.U./dL
pH, UA: 7.5 (ref 5.0–8.0)

## 2017-10-04 NOTE — Progress Notes (Signed)
ROB: Doing well, no complaints.  BPs wnl today (last visit elevated). Advised that we will continue to monitor BPs. PIH labs were negative.  RTC in 2 weeks.

## 2017-10-04 NOTE — Progress Notes (Signed)
ROB pt is doing well no concerns.  

## 2017-10-11 ENCOUNTER — Telehealth: Payer: Self-pay | Admitting: Obstetrics and Gynecology

## 2017-10-11 ENCOUNTER — Encounter: Payer: Self-pay | Admitting: Obstetrics and Gynecology

## 2017-10-11 NOTE — Telephone Encounter (Signed)
The patient called and stated that she has been  experiencing a really bad cold for a week now, The patient is coughing up green mucus, having head pressure/headache, and scratchy throat. The patient would like to be advised of what to do since she is not able to relieve her symptoms with over the counter medications. Please advise.

## 2017-10-11 NOTE — Telephone Encounter (Signed)
[facility]. She was dx with a sinus infection and given amoxicillin. Pt advised if no better after completion of atb to f/u with us.

## 2017-10-20 ENCOUNTER — Encounter: Payer: Self-pay | Admitting: Obstetrics and Gynecology

## 2017-10-20 ENCOUNTER — Ambulatory Visit (INDEPENDENT_AMBULATORY_CARE_PROVIDER_SITE_OTHER): Payer: Self-pay | Admitting: Obstetrics and Gynecology

## 2017-10-20 VITALS — BP 132/81 | HR 81 | Wt 198.3 lb

## 2017-10-20 DIAGNOSIS — Z3483 Encounter for supervision of other normal pregnancy, third trimester: Secondary | ICD-10-CM

## 2017-10-20 LAB — POCT URINALYSIS DIPSTICK
BILIRUBIN UA: NEGATIVE
Blood, UA: NEGATIVE
GLUCOSE UA: NEGATIVE
KETONES UA: NEGATIVE
Leukocytes, UA: NEGATIVE
Nitrite, UA: NEGATIVE
Protein, UA: POSITIVE — AB
SPEC GRAV UA: 1.01 (ref 1.010–1.025)
Urobilinogen, UA: 0.2 E.U./dL
pH, UA: 8 (ref 5.0–8.0)

## 2017-10-20 NOTE — Progress Notes (Signed)
ROB-pt stated that she had a sinus infections and was prescribed antibotics for the infection. No other complaints.

## 2017-10-20 NOTE — Progress Notes (Signed)
ROB:  No problems.  Mild HTN noted and discussed.  Cxs next week. Heartburn discussed.

## 2017-10-27 ENCOUNTER — Ambulatory Visit (INDEPENDENT_AMBULATORY_CARE_PROVIDER_SITE_OTHER): Payer: Self-pay | Admitting: Obstetrics and Gynecology

## 2017-10-27 ENCOUNTER — Encounter: Payer: Self-pay | Admitting: Obstetrics and Gynecology

## 2017-10-27 VITALS — BP 132/85 | HR 96 | Wt 200.6 lb

## 2017-10-27 DIAGNOSIS — Z3483 Encounter for supervision of other normal pregnancy, third trimester: Secondary | ICD-10-CM

## 2017-10-27 LAB — POCT URINALYSIS DIPSTICK
BILIRUBIN UA: NEGATIVE
GLUCOSE UA: NEGATIVE
KETONES UA: NEGATIVE
Nitrite, UA: NEGATIVE
Protein, UA: POSITIVE — AB
Spec Grav, UA: <=1.005 — AB (ref 1.010–1.025)
Urobilinogen, UA: 0.2 E.U./dL
pH, UA: 7.5 (ref 5.0–8.0)

## 2017-10-27 NOTE — Progress Notes (Signed)
ROB:  141/93 -improved to132/85 by the end of her visit.  Scheduled for follow-up in labor and delivery for BP check in 2 days.  GC/CT-GBS performed.

## 2017-10-27 NOTE — Progress Notes (Signed)
ROB-pt stated that she was doing well no complaints.  

## 2017-10-27 NOTE — Addendum Note (Signed)
Addended by: Silvano BilisHAMPTON, Karesa Maultsby L on: 10/27/2017 01:52 PM   Modules accepted: Orders

## 2017-10-29 ENCOUNTER — Observation Stay
Admission: EM | Admit: 2017-10-29 | Discharge: 2017-10-29 | Disposition: A | Discharge status: Home / Self Care | Payer: Self-pay | Attending: Obstetrics and Gynecology | Admitting: Obstetrics and Gynecology

## 2017-10-29 DIAGNOSIS — Z3A35 35 weeks gestation of pregnancy: Secondary | ICD-10-CM

## 2017-10-29 DIAGNOSIS — Z3490 Encounter for supervision of normal pregnancy, unspecified, unspecified trimester: Secondary | ICD-10-CM

## 2017-10-29 DIAGNOSIS — R030 Elevated blood-pressure reading, without diagnosis of hypertension: Secondary | ICD-10-CM

## 2017-10-29 DIAGNOSIS — O9989 Other specified diseases and conditions complicating pregnancy, childbirth and the puerperium: Secondary | ICD-10-CM

## 2017-10-29 DIAGNOSIS — O26893 Other specified pregnancy related conditions, third trimester: Principal | ICD-10-CM | POA: Insufficient documentation

## 2017-10-29 LAB — GC/CHLAMYDIA PROBE AMP
Chlamydia trachomatis, NAA: NEGATIVE
NEISSERIA GONORRHOEAE BY PCR: NEGATIVE

## 2017-10-29 LAB — STREP GP B NAA: STREP GROUP B AG: NEGATIVE

## 2017-10-29 NOTE — OB Triage Note (Signed)
G2P1 3479w2d presents to BP due to scheduled NST for elevated BP. Denies h/a, blurry vision, epigastric pain. 2+ reflexes patellar. Positive for clonus bilaterally. Monitors applied and assessing.

## 2017-10-29 NOTE — Progress Notes (Signed)
Discharge instructions reviewed. Red flag signs and symptoms reviewed. Pt able to demonstrate understanding of red flag symptoms for PIH.

## 2017-10-29 NOTE — Discharge Summary (Signed)
    L&D OB Triage Note  SUBJECTIVE Jillian Anderson is a 32 y.o. G2P0010 female at 6044w2d, EDD Estimated Date of Delivery: 12/01/17 who presented to triage for BP check and NST.   OB History  Gravida Para Term Preterm AB Living  2 0 0 0 1 0  SAB TAB Ectopic Multiple Live Births  1 0 0 0 0    # Outcome Date GA Lbr Len/2nd Weight Sex Delivery Anes PTL Lv  2 Current           1 SAB             No medications prior to admission.     OBJECTIVE  Nursing Evaluation:   BP (!) 159/92   Pulse 83   Temp 98 F (36.7 C) (Oral)   Resp 18   LMP 02/24/2017    Findings:     NST was performed and has been reviewed by me.  NST INTERPRETATION: Category I  Mode: External Baseline Rate (A): 135 bpm Variability: Moderate Accelerations: 15 x 15 Decelerations: None     Contraction Frequency (min): none/UI  ASSESSMENT Impression:  1.  Pregnancy:  G2P0010 at 6144w2d , EDD Estimated Date of Delivery: 12/01/17 2.  NST:  Category I  PLAN 1. Reassurance given 2. Discharge home with standard labor precautions and PIH precautions given to return to L&D or call the office for problems. 3. Continue routine prenatal care Tues in office.

## 2017-10-29 NOTE — Progress Notes (Signed)
Spoke with Dr. Logan BoresEvans, MD. Reported elevated BP, positive for clonus, fetal heart rate tracing with baseline 135 and positive for accelerations, no decelrations. MD says pt okay to discharge and will follow up with pt in the office Tuesday.

## 2017-11-01 ENCOUNTER — Other Ambulatory Visit: Payer: Self-pay

## 2017-11-01 ENCOUNTER — Encounter: Payer: Self-pay | Admitting: Obstetrics and Gynecology

## 2017-11-01 ENCOUNTER — Ambulatory Visit (INDEPENDENT_AMBULATORY_CARE_PROVIDER_SITE_OTHER): Payer: Self-pay | Admitting: Obstetrics and Gynecology

## 2017-11-01 ENCOUNTER — Observation Stay
Admission: EM | Admit: 2017-11-01 | Discharge: 2017-11-02 | Disposition: A | Discharge status: Home / Self Care | Payer: Medicaid Other | Attending: Obstetrics and Gynecology | Admitting: Obstetrics and Gynecology

## 2017-11-01 VITALS — BP 161/104 | HR 96 | Wt 199.8 lb

## 2017-11-01 DIAGNOSIS — O1413 Severe pre-eclampsia, third trimester: Secondary | ICD-10-CM | POA: Diagnosis not present

## 2017-11-01 DIAGNOSIS — O1490 Unspecified pre-eclampsia, unspecified trimester: Secondary | ICD-10-CM | POA: Diagnosis present

## 2017-11-01 DIAGNOSIS — O133 Gestational [pregnancy-induced] hypertension without significant proteinuria, third trimester: Secondary | ICD-10-CM

## 2017-11-01 DIAGNOSIS — Z885 Allergy status to narcotic agent status: Secondary | ICD-10-CM | POA: Insufficient documentation

## 2017-11-01 DIAGNOSIS — O365930 Maternal care for other known or suspected poor fetal growth, third trimester, not applicable or unspecified: Secondary | ICD-10-CM | POA: Insufficient documentation

## 2017-11-01 DIAGNOSIS — Z881 Allergy status to other antibiotic agents status: Secondary | ICD-10-CM | POA: Insufficient documentation

## 2017-11-01 DIAGNOSIS — Z79899 Other long term (current) drug therapy: Secondary | ICD-10-CM | POA: Diagnosis not present

## 2017-11-01 DIAGNOSIS — O1493 Unspecified pre-eclampsia, third trimester: Secondary | ICD-10-CM | POA: Diagnosis present

## 2017-11-01 DIAGNOSIS — O139 Gestational [pregnancy-induced] hypertension without significant proteinuria, unspecified trimester: Secondary | ICD-10-CM | POA: Diagnosis present

## 2017-11-01 DIAGNOSIS — Z3A35 35 weeks gestation of pregnancy: Secondary | ICD-10-CM | POA: Diagnosis not present

## 2017-11-01 DIAGNOSIS — Z3483 Encounter for supervision of other normal pregnancy, third trimester: Secondary | ICD-10-CM

## 2017-11-01 DIAGNOSIS — Z3689 Encounter for other specified antenatal screening: Secondary | ICD-10-CM

## 2017-11-01 DIAGNOSIS — O4193X0 Disorder of amniotic fluid and membranes, unspecified, third trimester, not applicable or unspecified: Secondary | ICD-10-CM

## 2017-11-01 HISTORY — DX: Unspecified pre-eclampsia, unspecified trimester: O14.90

## 2017-11-01 LAB — POCT URINALYSIS DIPSTICK
Bilirubin, UA: NEGATIVE
Glucose, UA: NEGATIVE
Ketones, UA: NEGATIVE
NITRITE UA: NEGATIVE
Odor: NEGATIVE
PH UA: 8 (ref 5.0–8.0)
PROTEIN UA: POSITIVE — AB
UROBILINOGEN UA: 0.2 U/dL

## 2017-11-01 LAB — COMPREHENSIVE METABOLIC PANEL
ALT: 16 U/L (ref 0–44)
ANION GAP: 8 (ref 5–15)
AST: 21 U/L (ref 15–41)
Albumin: 2.7 g/dL — ABNORMAL LOW (ref 3.5–5.0)
Alkaline Phosphatase: 82 U/L (ref 38–126)
BUN: 10 mg/dL (ref 6–20)
CHLORIDE: 109 mmol/L (ref 98–111)
CO2: 21 mmol/L — AB (ref 22–32)
Calcium: 9.6 mg/dL (ref 8.9–10.3)
Creatinine, Ser: 0.64 mg/dL (ref 0.44–1.00)
Glucose, Bld: 75 mg/dL (ref 70–99)
POTASSIUM: 3.6 mmol/L (ref 3.5–5.1)
SODIUM: 138 mmol/L (ref 135–145)
Total Bilirubin: 0.5 mg/dL (ref 0.3–1.2)
Total Protein: 6.6 g/dL (ref 6.5–8.1)

## 2017-11-01 LAB — CBC
HEMATOCRIT: 32.2 % — AB (ref 35.0–47.0)
HEMOGLOBIN: 11.2 g/dL — AB (ref 12.0–16.0)
MCH: 32 pg (ref 26.0–34.0)
MCHC: 34.9 g/dL (ref 32.0–36.0)
MCV: 91.7 fL (ref 80.0–100.0)
PLATELETS: 141 10*3/uL — AB (ref 150–440)
RBC: 3.51 MIL/uL — AB (ref 3.80–5.20)
RDW: 14 % (ref 11.5–14.5)
WBC: 9.1 10*3/uL (ref 3.6–11.0)

## 2017-11-01 LAB — PROTEIN / CREATININE RATIO, URINE
Creatinine, Urine: 19 mg/dL
Protein Creatinine Ratio: 0.89 mg/mg{Cre} — ABNORMAL HIGH (ref 0.00–0.15)
Total Protein, Urine: 17 mg/dL

## 2017-11-01 MED ORDER — LABETALOL HCL 5 MG/ML IV SOLN
20.0000 mg | INTRAVENOUS | Status: AC | PRN
  Administered 2017-11-01 (×2): 20 mg via INTRAVENOUS
  Administered 2017-11-01: 40 mg via INTRAVENOUS
  Administered 2017-11-02: 20 mg via INTRAVENOUS
  Filled 2017-11-01: qty 4
  Filled 2017-11-01 (×2): qty 8
  Filled 2017-11-01: qty 4

## 2017-11-01 MED ORDER — CALCIUM CARBONATE ANTACID 500 MG PO CHEW: 2.0000 | CHEWABLE_TABLET | ORAL | Status: DC | PRN

## 2017-11-01 MED ORDER — PRENATAL MULTIVITAMIN CH: 1.0000 | ORAL_TABLET | Freq: Every day | ORAL | Status: DC

## 2017-11-01 MED ORDER — FAMOTIDINE 20 MG PO TABS
10.0000 mg | ORAL_TABLET | Freq: Two times a day (BID) | ORAL | Status: DC
  Administered 2017-11-01: 10 mg via ORAL
  Filled 2017-11-01: qty 1

## 2017-11-01 MED ORDER — LABETALOL HCL 100 MG PO TABS
200.0000 mg | ORAL_TABLET | Freq: Once | ORAL | Status: AC
  Administered 2017-11-01: 200 mg via ORAL
  Filled 2017-11-01: qty 2

## 2017-11-01 MED ORDER — SODIUM CHLORIDE FLUSH 0.9 % IV SOLN
INTRAVENOUS | Status: AC
  Filled 2017-11-01: qty 20

## 2017-11-01 MED ORDER — LACTATED RINGERS IV SOLN
INTRAVENOUS | Status: DC
  Administered 2017-11-02 (×2): via INTRAVENOUS

## 2017-11-01 MED ORDER — LABETALOL HCL 100 MG PO TABS
200.0000 mg | ORAL_TABLET | Freq: Three times a day (TID) | ORAL | Status: DC
Start: 2017-11-01 — End: 2017-11-02
  Administered 2017-11-01 – 2017-11-02 (×2): 200 mg via ORAL
  Filled 2017-11-01 (×2): qty 2

## 2017-11-01 MED ORDER — DOCUSATE SODIUM 100 MG PO CAPS: 100.0000 mg | ORAL_CAPSULE | Freq: Every day | ORAL | Status: DC

## 2017-11-01 MED ORDER — ACETAMINOPHEN 325 MG PO TABS
650.0000 mg | ORAL_TABLET | ORAL | Status: DC | PRN
  Administered 2017-11-01: 650 mg via ORAL
  Filled 2017-11-01: qty 2

## 2017-11-01 MED ORDER — HYDRALAZINE HCL 20 MG/ML IJ SOLN: 10.0000 mg | Freq: Once | INTRAMUSCULAR | Status: DC | PRN

## 2017-11-01 NOTE — H&P (Signed)
Obstetric History and Physical  Jillian Anderson is a 32 y.o. G2P0010 with IUP at [redacted]w[redacted]d presenting from clinic secondary to elevated blood pressures. Patient denies having contractions, vaginal bleeding, ruptured membranes, with active fetal movement.  BP in clinic 161/104, repeat164/94. Patient at time of visit denied headaches, blurred vision, RUQ pain.   Prenatal Course Source of Care: [facility] with onset of care at 10 weeks Pregnancy complications or risks: Patient Active Problem List   Diagnosis Date Noted  . PIH (pregnancy induced hypertension) 11/01/2017  . Pre-eclampsia affecting pregnancy, antepartum 11/01/2017  . Pregnancy 10/29/2017  . Nausea and vomiting during pregnancy 04/20/2017  . Unsure of LMP (last menstrual period) as reason for ultrasound scan 04/20/2017  . Positive urine pregnancy test 04/20/2017  . History of miscarriage, currently pregnant 04/20/2017  . Ear pain 06/04/2016  . Hyperlipidemia 06/04/2016  . Embryonic demise 05/13/2016  . Status post D&C 05/13/2016  . Asthma without status asthmaticus 03/15/2016  . Polycystic ovarian syndrome 03/15/2016  . Obesity (BMI 30.0-34.9) 03/15/2016  . Mild intermittent asthma without complication 03/15/2016  . Folliculitis 09/12/2015  . Dermatitis 09/12/2015  . GERD (gastroesophageal reflux disease) 08/01/2013  . Dysmenorrhea 09/22/2012  . Nephrolithiasis 09/03/2011  . Palpitations 07/08/2011  . IBS (irritable bowel syndrome) 07/08/2011  . Migraines 01/06/2011  . Allergic rhinitis 01/06/2011    Prenatal labs and studies: ABO, Rh:   O POS Antibody Negative Antibody: Negative (01/07 1026) Rubella: 4.30 (01/07 1026) RPR: Non Reactive (05/15 0927)  HBsAg: Negative (01/07 1026)  HIV: Non Reactive (01/07 1026)  WGN:FAOZHYQM (06/27 1402) 1 hr Glucola abnormal, with normal 3 hr GTT Genetic screening normal Anatomy US normal   Past Medical History:  Diagnosis Date  . Allergy   . Asthma without  status asthmaticus 03/15/2016  . Astigmatism   . GERD (gastroesophageal reflux disease)   . History of kidney stones    In the past  . Migraines   . PCOS (polycystic ovarian syndrome)   . Polycystic disease, ovaries   . Tachycardia   . Tachycardia     Past Surgical History:  Procedure Laterality Date  . DILATION AND EVACUATION N/A 04/23/2016   Procedure: DILATATION AND EVACUATION;  Surgeon: Herold Harms, MD;  Location: [facility] ORS;  Service: Gynecology;  Laterality: N/A;  . WISDOM TOOTH EXTRACTION      OB History  Gravida Para Term Preterm AB Living  2       1 0  SAB TAB Ectopic Multiple Live Births  1            # Outcome Date GA Lbr Len/2nd Weight Sex Delivery Anes PTL Lv  2 Current           1 SAB             Social History   Socioeconomic History  . Marital status: Married    Spouse name: Not on file  . Number of children: Not on file  . Years of education: Not on file  . Highest education level: Not on file  Occupational History  . Not on file  Social Needs  . Financial resource strain: Not on file  . Food insecurity:    Worry: Not on file    Inability: Not on file  . Transportation needs:    Medical: Not on file    Non-medical: Not on file  Tobacco Use  . Smoking status: Never Smoker  . Smokeless tobacco: Never Used  Substance and Sexual Activity  .  Alcohol use: Yes    Alcohol/week: 0.6 oz    Types: 1 Glasses of wine per week    Comment: occassionally - not in pregnancy  . Drug use: No  . Sexual activity: Yes    Birth control/protection: None  Lifestyle  . Physical activity:    Days per week: Not on file    Minutes per session: Not on file  . Stress: Not on file  Relationships  . Social connections:    Talks on phone: Not on file    Gets together: Not on file    Attends religious service: Not on file    Active member of club or organization: Not on file    Attends meetings of clubs or organizations: Not on file    Relationship status:  Not on file  Other Topics Concern  . Not on file  Social History Narrative  . Not on file    Family History  Problem Relation Age of Onset  . Hypertension Mother   . Hyperlipidemia Father   . Diabetes Father   . Asthma Brother   . Breast cancer Neg Hx   . Ovarian cancer Neg Hx   . Colon cancer Neg Hx     Medications Prior to Admission  Medication Sig Dispense Refill Last Dose  . calcium carbonate (TUMS EX) 750 MG chewable tablet Chew 1 tablet by mouth daily.   Taking  . docusate sodium (COLACE) 100 MG capsule Take 100 mg by mouth 2 (two) times daily.   Taking  . Prenatal Vit-Fe Fumarate-FA (PRENATAL MULTIVITAMIN) TABS tablet Take 1 tablet by mouth daily at 12 noon.   Taking  . Probiotic Product (PROBIOTIC-10 PO) Take by mouth.   Taking  . ranitidine (ZANTAC) 150 MG capsule Take 150 mg by mouth 2 (two) times daily.   Taking    Allergies  Allergen Reactions  . Ceclor [Cefaclor]     hives  . Codeine     Body fights itself eyes dilate  . Red Dye     Hives, nausea and migraines   . Blackberry Flavor Nausea And Vomiting and Rash  . Raspberry Nausea And Vomiting and Rash    Review of Systems: Patient reported vague RUQ pain shortly after presentation to triage.   Physical Exam: BP (!) 143/89   Pulse 90   LMP 02/24/2017    Temp:  [97.6 F (36.4 C)-98.3 F (36.8 C)] 98.3 F (36.8 C) (07/02 1953) Pulse Rate:  [76-105] 88 (07/02 2248) Resp:  [16] 16 (07/02 1953) BP: (127-172)/(68-109) 130/74 (07/02 2248) Weight:  [199 lb 12.8 oz (90.6 kg)] 199 lb 12.8 oz (90.6 kg) (07/02 1059)  CONSTITUTIONAL: Well-developed, well-nourished female in no acute distress.  HENT:  Normocephalic, atraumatic, External right and left ear normal. Oropharynx is clear and moist EYES: Conjunctivae and EOM are normal. Pupils are equal, round, and reactive to light. No scleral icterus.  NECK: Normal range of motion, supple, no masses SKIN: Skin is warm and dry. No rash noted. Not diaphoretic. No  erythema. No pallor. NEUROLOGIC: Alert and oriented to person, place, and time. Normal reflexes, muscle tone coordination. No cranial nerve deficit noted. PSYCHIATRIC: Normal mood and affect. Normal behavior. Normal judgment and thought content. CARDIOVASCULAR: Normal heart rate noted, regular rhythm RESPIRATORY: Effort and breath sounds normal, no problems with respiration noted ABDOMEN: Soft, nontender, nondistended, gravid. MUSCULOSKELETAL: Normal range of motion. No edema and no tenderness. 2+ distal pulses.  Cervical Exam: Deferred.   FHT:  Baseline rate 140  bpm   Variability moderate  Accelerations present   Decelerations 1 spontaneous deceleration noted at ~ 2:30 pm after patient ambulated to restroom.   Contractions: None   Pertinent Labs/Studies:   Results for orders placed or performed during the hospital encounter of 11/01/17 (from the past 24 hour(s))  Comprehensive metabolic panel     Status: Abnormal   Collection Time: 11/01/17 11:57 AM  Result Value Ref Range   Sodium 138 135 - 145 mmol/L   Potassium 3.6 3.5 - 5.1 mmol/L   Chloride 109 98 - 111 mmol/L   CO2 21 (L) 22 - 32 mmol/L   Glucose, Bld 75 70 - 99 mg/dL   BUN 10 6 - 20 mg/dL   Creatinine, Ser 1.61 0.44 - 1.00 mg/dL   Calcium 9.6 8.9 - 09.6 mg/dL   Total Protein 6.6 6.5 - 8.1 g/dL   Albumin 2.7 (L) 3.5 - 5.0 g/dL   AST 21 15 - 41 U/L   ALT 16 0 - 44 U/L   Alkaline Phosphatase 82 38 - 126 U/L   Total Bilirubin 0.5 0.3 - 1.2 mg/dL   GFR calc non Af Amer >60 >60 mL/min   GFR calc Af Amer >60 >60 mL/min   Anion gap 8 5 - 15  CBC     Status: Abnormal   Collection Time: 11/01/17 11:57 AM  Result Value Ref Range   WBC 9.1 3.6 - 11.0 K/uL   RBC 3.51 (L) 3.80 - 5.20 MIL/uL   Hemoglobin 11.2 (L) 12.0 - 16.0 g/dL   HCT 04.5 (L) 40.9 - 81.1 %   MCV 91.7 80.0 - 100.0 fL   MCH 32.0 26.0 - 34.0 pg   MCHC 34.9 32.0 - 36.0 g/dL   RDW 91.4 78.2 - 95.6 %   Platelets 141 (L) 150 - 440 K/uL  Protein / creatinine ratio,  urine     Status: Abnormal   Collection Time: 11/01/17 11:57 AM  Result Value Ref Range   Creatinine, Urine 19 mg/dL   Total Protein, Urine 17 mg/dL   Protein Creatinine Ratio 0.89 (H) 0.00 - 0.15 mg/mg[Cre]    Assessment : Jillian Anderson is a 32 y.o. G2P0010 at [redacted]w[redacted]d being admitted for management of pre-eclampsia in late preterm pregnancy.   Plan: 1. Will admit for observation. Has required IV treatment x 1 dose of Labetalol for severe range blood pressure. Initiated on PO Labetalol 200 mg after this.  Will continue treatment TID. If patient with multiple severe range BPs requiring treatment, will proceed with delivery. Will initiate Magnesium sulfate at that time. If patient's BPs remain treatable with PO medication, can d/c home tomorrow with strict f/u of twice weekly office visits and labs to follow pre-eclampsia until [redacted] weeks gestation, then plan for delivery.  2. Discussed antenatal steroids with Neonatology. Recommend that if patient will be delivered in 7 days or less, can administer steroids. Otherwise, can omit.  If patient's condition changes in next 24 hours, will consider administration.  3. RUQ pain, mild.  Also noted pain shortly after precautions given.  Will continue to monitor and if symptoms worsen, will proceed with induction for pre-eclampsia.  LFTs were normal.  4. Fetal well being - fetal tracing has been otherwise reassuring except for deceleration noted after ambulation to the restroom. Continue to monitor.   5. Discussed need for C-section if fetal status is non-reassuring, malpresentation of fetus. Otherwise can plan for vaginal delivery at time of induction.  6. Can have regular  diet unless status changes.     Hildred Laser, MD [facility]

## 2017-11-01 NOTE — Progress Notes (Signed)
ROB: Patient with a significant elevation in blood pressure.  Retake 164/94 manually by me.  Patient denies headache or epigastric pain or other severe features.  Sent immediately to labor and delivery for blood pressure management, BMP, CBC, UPC ratio.

## 2017-11-01 NOTE — Progress Notes (Signed)
ROB- f/u- seen in L &D 6/29 for elevated bp. nst reactive.

## 2017-11-01 NOTE — OB Triage Note (Signed)
[facility]. Denies any vision, headaches, or epigastric pain.

## 2017-11-02 ENCOUNTER — Observation Stay: Payer: Medicaid Other

## 2017-11-02 DIAGNOSIS — O1413 Severe pre-eclampsia, third trimester: Secondary | ICD-10-CM | POA: Diagnosis not present

## 2017-11-02 MED ORDER — LABETALOL HCL 200 MG PO TABS: 200.0000 mg | ORAL_TABLET | Freq: Three times a day (TID) | ORAL | 0 refills | Status: DC

## 2017-11-02 MED ORDER — BETAMETHASONE SOD PHOS & ACET 6 (3-3) MG/ML IJ SUSP
12.0000 mg | Freq: Once | INTRAMUSCULAR | Status: AC
  Administered 2017-11-02: 12 mg via INTRAMUSCULAR

## 2017-11-02 MED ORDER — BETAMETHASONE SOD PHOS & ACET 6 (3-3) MG/ML IJ SUSP
INTRAMUSCULAR | Status: AC
  Administered 2017-11-02: 12 mg via INTRAMUSCULAR
  Filled 2017-11-02: qty 1

## 2017-11-02 NOTE — Discharge Instructions (Signed)
Preeclampsia and Eclampsia °Preeclampsia is a serious condition that develops only during pregnancy. It is also called toxemia of pregnancy. This condition causes high blood pressure along with other symptoms, such as swelling and headaches. These symptoms may develop as the condition gets worse. Preeclampsia may occur at 20 weeks of pregnancy or later. °Diagnosing and treating preeclampsia early is very important. If not treated early, it can cause serious problems for you and your baby. One problem it can lead to is eclampsia, which is a condition that causes muscle jerking or shaking (convulsions or seizures) in the mother. Delivering your baby is the best treatment for preeclampsia or eclampsia. Preeclampsia and eclampsia symptoms usually go away after your baby is born. °What are the causes? °The cause of preeclampsia is not known. °What increases the risk? °The following risk factors make you more likely to develop preeclampsia: °· Being pregnant for the first time. °· Having had preeclampsia during a past pregnancy. °· Having a family history of preeclampsia. °· Having high blood pressure. °· Being pregnant with twins or triplets. °· Being 35 or older. °· Being African-American. °· Having kidney disease or diabetes. °· Having medical conditions such as lupus or blood diseases. °· Being very overweight (obese). ° °What are the signs or symptoms? °The earliest signs of preeclampsia are: °· High blood pressure. °· Increased protein in your urine. Your health care provider will check for this at every visit before you give birth (prenatal visit). ° °Other symptoms that may develop as the condition gets worse include: °· Severe headaches. °· Sudden weight gain. °· Swelling of the hands, face, legs, and feet. °· Nausea and vomiting. °· Vision problems, such as blurred or double vision. °· Numbness in the face, arms, legs, and feet. °· Urinating less than usual. °· Dizziness. °· Slurred speech. °· Abdominal pain,  especially upper abdominal pain. °· Convulsions or seizures. ° °Symptoms generally go away after giving birth. °How is this diagnosed? °There are no screening tests for preeclampsia. Your health care provider will ask you about symptoms and check for signs of preeclampsia during your prenatal visits. You may also have tests that include: °· Urine tests. °· Blood tests. °· Checking your blood pressure. °· Monitoring your baby’s heart rate. °· Ultrasound. ° °How is this treated? °You and your health care provider will determine the treatment approach that is best for you. Treatment may include: °· Having more frequent prenatal exams to check for signs of preeclampsia, if you have an increased risk for preeclampsia. °· Bed rest. °· Reducing how much salt (sodium) you eat. °· Medicine to lower your blood pressure. °· Staying in the hospital, if your condition is severe. There, treatment will focus on controlling your blood pressure and the amount of fluids in your body (fluid retention). °· You may need to take medicine (magnesium sulfate) to prevent seizures. This medicine may be given as an injection or through an IV tube. °· Delivering your baby early, if your condition gets worse. You may have your labor started with medicine (induced), or you may have a cesarean delivery. ° °Follow these instructions at home: °Eating and drinking ° °· Drink enough fluid to keep your urine clear or pale yellow. °· Eat a healthy diet that is low in sodium. Do not add salt to your food. Check nutrition labels to see how much sodium a food or beverage contains. °· Avoid caffeine. °Lifestyle °· Do not use any products that contain nicotine or tobacco, such as cigarettes   and e-cigarettes. If you need help quitting, ask your health care provider. °· Do not use alcohol or drugs. °· Avoid stress as much as possible. Rest and get plenty of sleep. °General instructions °· Take over-the-counter and prescription medicines only as told by your  health care provider. °· When lying down, lie on your side. This keeps pressure off of your baby. °· When sitting or lying down, raise (elevate) your feet. Try putting some pillows underneath your lower legs. °· Exercise regularly. Ask your health care provider what kinds of exercise are best for you. °· Keep all follow-up and prenatal visits as told by your health care provider. This is important. °How is this prevented? °To prevent preeclampsia or eclampsia from developing during another pregnancy: °· Get proper medical care during pregnancy. Your health care provider may be able to prevent preeclampsia or diagnose and treat it early. °· Your health care provider may have you take a low-dose aspirin or a calcium supplement during your next pregnancy. °· You may have tests of your blood pressure and kidney function after giving birth. °· Maintain a healthy weight. Ask your health care provider for help managing weight gain during pregnancy. °· Work with your health care provider to manage any long-term (chronic) health conditions you have, such as diabetes or kidney problems. ° °Contact a health care provider if: °· You gain more weight than expected. °· You have headaches. °· You have nausea or vomiting. °· You have abdominal pain. °· You feel dizzy or light-headed. °Get help right away if: °· You develop sudden or severe swelling anywhere in your body. This usually happens in the legs. °· You gain 5 lbs (2.3 kg) or more during one week. °· You have severe: °? Abdominal pain. °? Headaches. °? Dizziness. °? Vision problems. °? Confusion. °? Nausea or vomiting. °· You have a seizure. °· You have trouble moving any part of your body. °· You develop numbness in any part of your body. °· You have trouble speaking. °· You have any abnormal bleeding. °· You pass out. °This information is not intended to replace advice given to you by your health care provider. Make sure you discuss any questions you have with your health  care provider. °Document Released: 04/16/2000 Document Revised: 12/16/2015 Document Reviewed: 11/24/2015 °Elsevier Interactive Patient Education © 2018 [employer] ° °

## 2017-11-02 NOTE — Progress Notes (Signed)
  Antenatal Progress Note  Subjective:     Patient ID: Jillian Anderson is a 32 y.o. female 2684w6d who was admitted overnight for observation for newly diagnosed pre-eclampsia.  Estimated Date of Delivery: 12/01/17 by patient's last menstrual period was 02/24/2017, consistent with 1st trimester sono.  HD# 2.   Subjective:  Patient denies complaints today.   Review of Systems Denies contractions, leakage of fluids, vaginal bleeding, and reports good fetal movement.     Objective:   Vitals:   11/02/17 0648 11/02/17 0721 11/02/17 0730 11/02/17 0748  BP: (!) 155/76   (!) 170/80  Pulse: 66 70  72  Resp:   16   Temp:   98.2 F (36.8 C)   TempSrc:   Oral     General appearance: alert and no distress Lungs: clear to auscultation bilaterally Heart: regular rate and rhythm, S1, S2 normal, no murmur, click, rub or gallop Abdomen: soft, non-tender; bowel sounds normal; no masses,  no organomegaly Pelvic: deferred Extremities: extremities normal, atraumatic, no cyanosis.  Trace edema bilaterally.    FHT: baseline 145 bpm, accels present.  Currently no decels present however overnight patient with several variables and a late deceleration (approximately 0020 and 0400).  Variability: moderate Toco: occasional contractions (mild)  Assessment:  32 y.o. female 5784w6d, Estimated Date of Delivery: 12/01/17 with:    1. Pe (pre-eclampsia), third trimester       Plan:   1. Patient required only 2 doses of IV labetalol treatment since admission.  Discussed that if her blood pressures are mostly able to be maintained on PO dosing, can consider d/c home with close follow up until [redacted] weeks gestation, where she will then be scheduled for induction.  If her blood pressures continue to require IV treatment, patient will remain inpatient and induction will proceed.  2. Will assess fetal status and AFI with ultrasound this morning in light of Category II tracing overnight. Tracing currently reassuring.  3.  Betamethasone if patient remains inpatient and requires earlier delivery prior to 36 weeks.    Hildred Laserherry, Roan Sawchuk, MD [facility]

## 2017-11-02 NOTE — Progress Notes (Signed)
Pt to US via wheelchair at [phone]

## 2017-11-02 NOTE — Discharge Summary (Signed)
OB Discharge Summary     Patient Name: Jillian Anderson DOB: Jul 09, 1985 MRN: [id number]  Date of admission: 11/01/2017 Delivering MD: This patient has no babies on file.  Date of discharge: 11/02/2017  Admitting diagnosis: Elevated blood pressures in third trimester Intrauterine pregnancy: [redacted]w[redacted]d     Secondary diagnosis:  Active Problems:   PIH (pregnancy induced hypertension)   Pre-eclampsia affecting pregnancy, antepartum  Additional problems: IUGR (7%ile)     Discharge diagnosis: Preeclampsia (severe) and IUGR, Preterm pregnancy-undelivered                                                                                            Hospital course:  The patient was admitted to the hospital for observation of elevated blood pressures noted in clinic.  She was evaluated and diagnosed with pre-eclampsia (with severe range BPs), however was otherwise asymptomatic. Her blood pressures were able to be mostly controlled with PO Labetalol, and required treatment with IV medication only twice within a 24 hour period. She had an ultrasound performed that noted IUGR at 7%ile. She was administered a dose of antenatal steroids, and was allowed to be discharged home on PO Labetalol with plans for induction no later than [redacted] weeks gestation. She is to return in 1 day to the hospital for her next dose of steroids, and she will follow up in clinic 2 days for repeat labs and evaluation.   Physical exam  Vitals:   11/02/17 0748 11/02/17 0801 11/02/17 0825 11/02/17 1100  BP: (!) 170/80 (!) 162/85 (!) 159/83 (!) 159/91  Pulse: 72 73 72 73  Resp:      Temp:      TempSrc:        General: alert and no distress CVS: S1 and S2 present. Regular rate and rhythym Pulmonary: Clear to auscultation bilaterally Abdomen: Gravid, non-tender.   Pelvis: deferred DVT Evaluation: No evidence of DVT seen on physical exam. Negative Homan's sign. No cords or calf tenderness. No significant calf/ankle  edema.  FHT: baseline 140 bpm. Accels present, no decels. Moderate variability.  Toco: occasional contractions  Labs: Lab Results  Component Value Date   WBC 9.1 11/01/2017   HGB 11.2 (L) 11/01/2017   HCT 32.2 (L) 11/01/2017   MCV 91.7 11/01/2017   PLT 141 (L) 11/01/2017   CMP Latest Ref Rng & Units 11/01/2017  Glucose 70 - 99 mg/dL 75  BUN 6 - 20 mg/dL 10  Creatinine 4.09 - 8.11 mg/dL 9.14  Sodium 782 - 956 mmol/L 138  Potassium 3.5 - 5.1 mmol/L 3.6  Chloride 98 - 111 mmol/L 109  CO2 22 - 32 mmol/L 21(L)  Calcium 8.9 - 10.3 mg/dL 9.6  Total Protein 6.5 - 8.1 g/dL 6.6  Total Bilirubin 0.3 - 1.2 mg/dL 0.5  Alkaline Phos 38 - 126 U/L 82  AST 15 - 41 U/L 21  ALT 0 - 44 U/L 16    Imaging:  CLINICAL DATA:  32 year old pregnant female with preeclampsia, evaluate growth and AFI.  Assigned EDC: 12/01/2017, projecting to an expected gestational age of [redacted] weeks 6 days  EXAM: OBSTETRICAL ULTRASOUND >14  WKS  FINDINGS: Number of Fetuses: 1  Heart Rate:  140 bpm  Movement: Yes  Presentation: Cephalic  Previa: No  Placental Location: Posterior  Amniotic Fluid (Subjective): Normal  Amniotic Fluid (Objective):  Vertical pocket 5.6cm  AFI 17.2 cm (5%ile= 7.7 cm, 95%= 24.9 cm for 36 wks)  FETAL BIOMETRY  BPD:  8.5cm 34w 3d  HC:    32.1cm 36w 2d  AC:   29.6cm 33w 4d  FL:   6.5cm 33w 2d  Current Mean GA: 33w 6d US EDC: 12/15/2017  Estimated Fetal Weight:  2,297g 8.5%ile  FETAL ANATOMY  Lateral Ventricles: Appears normal  Thalami/CSP: Limited views appear normal  Posterior Fossa:  Appears normal  Upper Lip: Appears normal  Spine: Appears normal  4 Chamber Heart on Left: Appears normal  LVOT: Appears normal  RVOT: Appears normal  Stomach on Left: Appears normal  3 Vessel Cord: Appears normal  Cord Insertion site: Not visualized  Kidneys: Appears normal  Bladder: Appears normal  Extremities: 4 extremities  are demonstrated  Sex: Female external genitalia  Technically difficult due to: Advanced gestational age  Maternal Findings:  Cervix:  Not visualized due to fetal head position.  IMPRESSION: 1. Single living intrauterine gestation in cephalic lie at 33 weeks 6 days by average ultrasound age, which lags the expected gestational age of [redacted] weeks 6 days by assigned EDC. 2. Estimated fetal weight 2297 g, at the 8.5 percentile, small for expected gestational age. Correlate clinically for possible IUGR. Fetal cord Doppler evaluation could be obtained as clinically warranted. 3. Normal amniotic fluid volume.  AFI 17.2. 4. No fetal abnormalities detected, with some limitations as Detailed.   Discharge instruction: per After Visit Summary and "Baby and Me Booklet".  After visit meds:  Allergies as of 11/02/2017      Reactions   Ceclor [cefaclor]    hives   Codeine    Body fights itself eyes dilate   Red Dye    Hives, nausea and migraines   Blackberry Flavor Nausea And Vomiting, Rash   Raspberry Nausea And Vomiting, Rash      Medication List    TAKE these medications   calcium carbonate 750 MG chewable tablet Commonly known as:  TUMS EX Chew 1 tablet by mouth daily.   docusate sodium 100 MG capsule Commonly known as:  COLACE Take 100 mg by mouth 2 (two) times daily.   labetalol 200 MG tablet Commonly known as:  NORMODYNE Take 1 tablet (200 mg total) by mouth 3 (three) times daily.   prenatal multivitamin Tabs tablet Take 1 tablet by mouth daily at 12 noon.   PROBIOTIC-10 PO Take by mouth.   ranitidine 150 MG capsule Commonly known as:  ZANTAC Take 150 mg by mouth 2 (two) times daily.       Diet: low salt diet  Activity: Modified bed rest  Outpatient follow up:2 days Follow up Appt: Future Appointments  Date Time Provider Department Center  11/04/2017 11:15 AM Hildred Laserherry, Akiel Fennell, MD [facility] None   Follow up Visit:No follow-ups on file.   11/02/2017 Hildred LaserAnika  Efstathios Sawin, MD [facility]

## 2017-11-02 NOTE — Progress Notes (Signed)
Pt returned to OBS 3 from [facility].  

## 2017-11-03 ENCOUNTER — Observation Stay
Admission: EM | Admit: 2017-11-03 | Discharge: 2017-11-03 | Disposition: A | Discharge status: Home / Self Care | Payer: Medicaid Other | Source: Home / Self Care | Admitting: Obstetrics and Gynecology

## 2017-11-03 DIAGNOSIS — Z3A36 36 weeks gestation of pregnancy: Secondary | ICD-10-CM

## 2017-11-03 DIAGNOSIS — Z79899 Other long term (current) drug therapy: Secondary | ICD-10-CM | POA: Insufficient documentation

## 2017-11-03 DIAGNOSIS — O1413 Severe pre-eclampsia, third trimester: Secondary | ICD-10-CM | POA: Insufficient documentation

## 2017-11-03 DIAGNOSIS — D649 Anemia, unspecified: Secondary | ICD-10-CM | POA: Insufficient documentation

## 2017-11-03 DIAGNOSIS — E669 Obesity, unspecified: Secondary | ICD-10-CM

## 2017-11-03 DIAGNOSIS — O99213 Obesity complicating pregnancy, third trimester: Secondary | ICD-10-CM | POA: Insufficient documentation

## 2017-11-03 DIAGNOSIS — Z885 Allergy status to narcotic agent status: Secondary | ICD-10-CM

## 2017-11-03 DIAGNOSIS — O99013 Anemia complicating pregnancy, third trimester: Secondary | ICD-10-CM | POA: Insufficient documentation

## 2017-11-03 DIAGNOSIS — Z881 Allergy status to other antibiotic agents status: Secondary | ICD-10-CM | POA: Insufficient documentation

## 2017-11-03 DIAGNOSIS — O365990 Maternal care for other known or suspected poor fetal growth, unspecified trimester, not applicable or unspecified: Secondary | ICD-10-CM | POA: Diagnosis present

## 2017-11-03 HISTORY — DX: Maternal care for other known or suspected poor fetal growth, unspecified trimester, not applicable or unspecified: O36.5990

## 2017-11-03 MED ORDER — BETAMETHASONE SOD PHOS & ACET 6 (3-3) MG/ML IJ SUSP
12.0000 mg | Freq: Once | INTRAMUSCULAR | Status: AC
  Administered 2017-11-03: 12 mg via INTRAMUSCULAR

## 2017-11-03 MED ORDER — BETAMETHASONE SOD PHOS & ACET 6 (3-3) MG/ML IJ SUSP
INTRAMUSCULAR | Status: AC
  Filled 2017-11-03: qty 1

## 2017-11-04 ENCOUNTER — Ambulatory Visit (INDEPENDENT_AMBULATORY_CARE_PROVIDER_SITE_OTHER): Payer: Self-pay

## 2017-11-04 ENCOUNTER — Encounter: Payer: Self-pay | Admitting: Obstetrics and Gynecology

## 2017-11-04 ENCOUNTER — Other Ambulatory Visit: Payer: Self-pay

## 2017-11-04 ENCOUNTER — Ambulatory Visit (INDEPENDENT_AMBULATORY_CARE_PROVIDER_SITE_OTHER): Payer: Self-pay | Admitting: Obstetrics and Gynecology

## 2017-11-04 ENCOUNTER — Other Ambulatory Visit: Payer: Self-pay | Admitting: Obstetrics and Gynecology

## 2017-11-04 ENCOUNTER — Inpatient Hospital Stay
Admission: EM | Admit: 2017-11-04 | Discharge: 2017-11-07 | DRG: 807 | Disposition: A | Discharge status: Home / Self Care | Payer: Medicaid Other | Source: Ambulatory Visit | Attending: Obstetrics and Gynecology | Admitting: Obstetrics and Gynecology

## 2017-11-04 VITALS — BP 150/87 | HR 68 | Wt 196.7 lb

## 2017-11-04 DIAGNOSIS — E669 Obesity, unspecified: Secondary | ICD-10-CM | POA: Diagnosis present

## 2017-11-04 DIAGNOSIS — O365990 Maternal care for other known or suspected poor fetal growth, unspecified trimester, not applicable or unspecified: Secondary | ICD-10-CM | POA: Diagnosis present

## 2017-11-04 DIAGNOSIS — O9962 Diseases of the digestive system complicating childbirth: Secondary | ICD-10-CM | POA: Diagnosis present

## 2017-11-04 DIAGNOSIS — K219 Gastro-esophageal reflux disease without esophagitis: Secondary | ICD-10-CM | POA: Diagnosis present

## 2017-11-04 DIAGNOSIS — O9902 Anemia complicating childbirth: Secondary | ICD-10-CM | POA: Diagnosis present

## 2017-11-04 DIAGNOSIS — O0993 Supervision of high risk pregnancy, unspecified, third trimester: Secondary | ICD-10-CM

## 2017-11-04 DIAGNOSIS — Z3A36 36 weeks gestation of pregnancy: Secondary | ICD-10-CM | POA: Diagnosis not present

## 2017-11-04 DIAGNOSIS — D649 Anemia, unspecified: Secondary | ICD-10-CM | POA: Diagnosis present

## 2017-11-04 DIAGNOSIS — O1414 Severe pre-eclampsia complicating childbirth: Principal | ICD-10-CM | POA: Diagnosis present

## 2017-11-04 DIAGNOSIS — O99214 Obesity complicating childbirth: Secondary | ICD-10-CM | POA: Diagnosis present

## 2017-11-04 DIAGNOSIS — O365930 Maternal care for other known or suspected poor fetal growth, third trimester, not applicable or unspecified: Secondary | ICD-10-CM | POA: Diagnosis present

## 2017-11-04 DIAGNOSIS — N3289 Other specified disorders of bladder: Secondary | ICD-10-CM | POA: Diagnosis not present

## 2017-11-04 DIAGNOSIS — O1490 Unspecified pre-eclampsia, unspecified trimester: Secondary | ICD-10-CM

## 2017-11-04 DIAGNOSIS — O1493 Unspecified pre-eclampsia, third trimester: Secondary | ICD-10-CM | POA: Diagnosis present

## 2017-11-04 DIAGNOSIS — E66811 Obesity, class 1: Secondary | ICD-10-CM | POA: Diagnosis present

## 2017-11-04 LAB — COMPREHENSIVE METABOLIC PANEL
ALBUMIN: 3.1 g/dL — AB (ref 3.5–5.5)
ALT: 17 IU/L (ref 0–32)
AST: 26 IU/L (ref 0–40)
Albumin/Globulin Ratio: 1 — ABNORMAL LOW (ref 1.2–2.2)
Alkaline Phosphatase: 94 IU/L (ref 39–117)
BUN / CREAT RATIO: 16 (ref 9–23)
BUN: 14 mg/dL (ref 6–20)
CHLORIDE: 103 mmol/L (ref 96–106)
CO2: 19 mmol/L — ABNORMAL LOW (ref 20–29)
Calcium: 12.3 mg/dL — ABNORMAL HIGH (ref 8.7–10.2)
Creatinine, Ser: 0.88 mg/dL (ref 0.57–1.00)
GFR, EST AFRICAN AMERICAN: 103 mL/min/{1.73_m2} (ref >59)
GFR, EST NON AFRICAN AMERICAN: 89 mL/min/{1.73_m2} (ref >59)
GLUCOSE: 78 mg/dL (ref 65–99)
Globulin, Total: 3.1 g/dL (ref 1.5–4.5)
Potassium: 4.1 mmol/L (ref 3.5–5.2)
Sodium: 137 mmol/L (ref 134–144)
TOTAL PROTEIN: 6.2 g/dL (ref 6.0–8.5)

## 2017-11-04 LAB — POCT URINALYSIS DIPSTICK
Bilirubin, UA: NEGATIVE
Blood, UA: NEGATIVE
GLUCOSE UA: NEGATIVE
Ketones, UA: NEGATIVE
Leukocytes, UA: NEGATIVE
Nitrite, UA: NEGATIVE
PH UA: 7.5 (ref 5.0–8.0)
Protein, UA: POSITIVE — AB
Spec Grav, UA: 1.01 (ref 1.010–1.025)
Urobilinogen, UA: 0.2 E.U./dL

## 2017-11-04 LAB — CBC
HEMOGLOBIN: 10.2 g/dL — AB (ref 11.1–15.9)
Hematocrit: 30.5 % — ABNORMAL LOW (ref 34.0–46.6)
MCH: 31.1 pg (ref 26.6–33.0)
MCHC: 33.4 g/dL (ref 31.5–35.7)
MCV: 93 fL (ref 79–97)
Platelets: 151 10*3/uL (ref 150–450)
RBC: 3.28 x10E6/uL — AB (ref 3.77–5.28)
RDW: 14 % (ref 12.3–15.4)
WBC: 12.4 10*3/uL — ABNORMAL HIGH (ref 3.4–10.8)

## 2017-11-04 LAB — PROTEIN / CREATININE RATIO, URINE
Creatinine, Urine: 67 mg/dL
Creatinine, Urine: 80 mg/dL
PROTEIN/CREAT RATIO: 633 mg/g{creat} — AB (ref 0–200)
Protein Creatinine Ratio: 0.56 mg/mg{Cre} — ABNORMAL HIGH (ref 0.00–0.15)
Protein, Ur: 42.4 mg/dL
TOTAL PROTEIN, URINE: 45 mg/dL

## 2017-11-04 LAB — TYPE AND SCREEN
ABO/RH(D): O POS
Antibody Screen: NEGATIVE

## 2017-11-04 MED ORDER — LABETALOL HCL 5 MG/ML IV SOLN
20.0000 mg | INTRAVENOUS | Status: AC | PRN
  Administered 2017-11-04: 80 mg via INTRAVENOUS
  Administered 2017-11-04: 20 mg via INTRAVENOUS
  Administered 2017-11-04: 40 mg via INTRAVENOUS
  Filled 2017-11-04: qty 16
  Filled 2017-11-04: qty 4
  Filled 2017-11-04 (×2): qty 8
  Filled 2017-11-04: qty 16

## 2017-11-04 MED ORDER — HYDRALAZINE HCL 20 MG/ML IJ SOLN
10.0000 mg | Freq: Once | INTRAMUSCULAR | Status: AC | PRN
  Administered 2017-11-04: 10 mg via INTRAVENOUS
  Filled 2017-11-04: qty 1

## 2017-11-04 MED ORDER — NIFEDIPINE 10 MG PO CAPS: 10.0000 mg | ORAL_CAPSULE | Freq: Once | ORAL | Status: DC

## 2017-11-04 MED ORDER — LIDOCAINE HCL (PF) 1 % IJ SOLN: 30.0000 mL | INTRAMUSCULAR | Status: DC | PRN

## 2017-11-04 MED ORDER — SOD CITRATE-CITRIC ACID 500-334 MG/5ML PO SOLN
30.0000 mL | ORAL | Status: DC | PRN
  Administered 2017-11-04 – 2017-11-05 (×2): 30 mL via ORAL
  Filled 2017-11-04 (×3): qty 15

## 2017-11-04 MED ORDER — MISOPROSTOL 50MCG HALF TABLET
50.0000 ug | ORAL_TABLET | ORAL | Status: DC | PRN
  Administered 2017-11-04 (×2): 50 ug via VAGINAL
  Filled 2017-11-04 (×3): qty 1

## 2017-11-04 MED ORDER — MAGNESIUM SULFATE BOLUS VIA INFUSION
4.0000 g | Freq: Once | INTRAVENOUS | Status: DC
  Filled 2017-11-04: qty 500

## 2017-11-04 MED ORDER — OXYTOCIN 40 UNITS IN LACTATED RINGERS INFUSION - SIMPLE MED
2.5000 [IU]/h | INTRAVENOUS | Status: DC
Start: 2017-11-04 — End: 2017-11-05
  Filled 2017-11-04: qty 1000

## 2017-11-04 MED ORDER — LIDOCAINE HCL (PF) 1 % IJ SOLN
INTRAMUSCULAR | Status: AC
  Administered 2017-11-05: 06:00:00 via VAGINAL
  Filled 2017-11-04: qty 30

## 2017-11-04 MED ORDER — MISOPROSTOL 200 MCG PO TABS
ORAL_TABLET | ORAL | Status: AC
  Filled 2017-11-04: qty 4

## 2017-11-04 MED ORDER — LABETALOL HCL 5 MG/ML IV SOLN
20.0000 mg | INTRAVENOUS | Status: DC | PRN
  Administered 2017-11-05: 20 mg via INTRAVENOUS
  Filled 2017-11-04: qty 8

## 2017-11-04 MED ORDER — URELLE 81 MG PO TABS
1.0000 | ORAL_TABLET | Freq: Four times a day (QID) | ORAL | Status: DC | PRN
  Administered 2017-11-05: 81 mg via ORAL
  Filled 2017-11-04 (×3): qty 1

## 2017-11-04 MED ORDER — TERBUTALINE SULFATE 1 MG/ML IJ SOLN
0.2500 mg | Freq: Once | INTRAMUSCULAR | Status: AC | PRN
  Administered 2017-11-05: 0.25 mg via SUBCUTANEOUS
  Filled 2017-11-04: qty 1

## 2017-11-04 MED ORDER — OXYTOCIN 10 UNIT/ML IJ SOLN
INTRAMUSCULAR | Status: AC
  Filled 2017-11-04: qty 2

## 2017-11-04 MED ORDER — MAGNESIUM SULFATE 40 G IN LACTATED RINGERS - SIMPLE: 2.0000 g/h | INTRAVENOUS | Status: DC

## 2017-11-04 MED ORDER — OXYTOCIN 40 UNITS IN LACTATED RINGERS INFUSION - SIMPLE MED: 1.0000 m[IU]/min | INTRAVENOUS | Status: DC

## 2017-11-04 MED ORDER — MAGNESIUM SULFATE 40 G IN LACTATED RINGERS - SIMPLE
INTRAVENOUS | Status: AC
  Administered 2017-11-04: 4 g
  Filled 2017-11-04: qty 500

## 2017-11-04 MED ORDER — CALCIUM CARBONATE ANTACID 500 MG PO CHEW
400.0000 mg | CHEWABLE_TABLET | ORAL | Status: DC | PRN
  Administered 2017-11-04: 400 mg via ORAL
  Filled 2017-11-04: qty 2

## 2017-11-04 MED ORDER — LACTATED RINGERS IV SOLN: 500.0000 mL | INTRAVENOUS | Status: DC | PRN

## 2017-11-04 MED ORDER — ACETAMINOPHEN 325 MG PO TABS
650.0000 mg | ORAL_TABLET | ORAL | Status: DC | PRN
  Administered 2017-11-04: 650 mg via ORAL
  Filled 2017-11-04: qty 2

## 2017-11-04 MED ORDER — OXYCODONE-ACETAMINOPHEN 5-325 MG PO TABS: 2.0000 | ORAL_TABLET | ORAL | Status: DC | PRN

## 2017-11-04 MED ORDER — ONDANSETRON HCL 4 MG/2ML IJ SOLN: 4.0000 mg | Freq: Four times a day (QID) | INTRAMUSCULAR | Status: DC | PRN

## 2017-11-04 MED ORDER — CALCIUM GLUCONATE 10 % IV SOLN
INTRAVENOUS | Status: AC
  Filled 2017-11-04: qty 10

## 2017-11-04 MED ORDER — HYDRALAZINE HCL 20 MG/ML IJ SOLN
5.0000 mg | INTRAMUSCULAR | Status: AC | PRN
  Administered 2017-11-04: 5 mg via INTRAVENOUS
  Administered 2017-11-05: 10 mg via INTRAVENOUS
  Filled 2017-11-04 (×2): qty 1

## 2017-11-04 MED ORDER — AMMONIA AROMATIC IN INHA
RESPIRATORY_TRACT | Status: AC
  Filled 2017-11-04: qty 10

## 2017-11-04 MED ORDER — LACTATED RINGERS IV SOLN
INTRAVENOUS | Status: DC
  Administered 2017-11-04 – 2017-11-05 (×2): via INTRAVENOUS

## 2017-11-04 MED ORDER — BUTORPHANOL TARTRATE 2 MG/ML IJ SOLN
1.0000 mg | INTRAMUSCULAR | Status: DC | PRN
  Administered 2017-11-04 (×2): 1 mg via INTRAVENOUS
  Filled 2017-11-04 (×2): qty 1

## 2017-11-04 MED ORDER — OXYTOCIN BOLUS FROM INFUSION
500.0000 mL | Freq: Once | INTRAVENOUS | Status: AC
  Administered 2017-11-05: 500 mL via INTRAVENOUS

## 2017-11-04 MED ORDER — OXYCODONE-ACETAMINOPHEN 5-325 MG PO TABS: 1.0000 | ORAL_TABLET | ORAL | Status: DC | PRN

## 2017-11-04 NOTE — H&P (Addendum)
Obstetric History and Physical  Jillian Anderson is a 32 y.o. G2P0010 with IUP at [redacted]w[redacted]d presenting from the office for worsening blood pressures with pre-eclampsia. Of note, patient was admitted for observation 4 days ago with newly diagnosed blood pressures.  She was able to be treated with PO Labetalol with management of her blood pressures. She also received a course of antenatal steroids. She was also diagnosed with IUGR (7th %ile) on ultrasound during that admission. Today she presented to the office for f/u, where her blood pressures were noted to be in severe range despite use of her PO Labetalol. Patient denies contractions, no vaginal bleeding, intact membranes, with active fetal movement.  She denies headaches or vision changes, but does note some mild intermittent RUQ pain (no change from several days ago).   Prenatal Course Source of Care: [facility]  with onset of care at 10 weeks Pregnancy complications or risks: Patient Active Problem List   Diagnosis Date Noted  . Pe (pre-eclampsia), third trimester 11/04/2017  . Labor and delivery, indication for care 11/03/2017  . PIH (pregnancy induced hypertension) 11/01/2017  . Pre-eclampsia affecting pregnancy, antepartum 11/01/2017  . Pregnancy 10/29/2017  . Nausea and vomiting during pregnancy 04/20/2017  . Unsure of LMP (last menstrual period) as reason for ultrasound scan 04/20/2017  . Positive urine pregnancy test 04/20/2017  . History of miscarriage, currently pregnant 04/20/2017  . Ear pain 06/04/2016  . Hyperlipidemia 06/04/2016  . Embryonic demise 05/13/2016  . Status post D&C 05/13/2016  . Asthma without status asthmaticus 03/15/2016  . Polycystic ovarian syndrome 03/15/2016  . Obesity (BMI 30.0-34.9) 03/15/2016  . Mild intermittent asthma without complication 03/15/2016  . Folliculitis 09/12/2015  . Dermatitis 09/12/2015  . GERD (gastroesophageal reflux disease) 08/01/2013  . Dysmenorrhea 09/22/2012  .  Nephrolithiasis 09/03/2011  . Palpitations 07/08/2011  . IBS (irritable bowel syndrome) 07/08/2011  . Migraines 01/06/2011  . Allergic rhinitis 01/06/2011   She plans to breastfeed She desires unsure method for postpartum contraception (possibly ParaGard).   Prenatal labs and studies: ABO, Rh: --/--/O POS (07/05 1535) Antibody: NEG (07/05 1535) Rubella: 4.30 (01/07 1026) RPR: Non Reactive (05/15 0927)  HBsAg: Negative (01/07 1026)  HIV: Non Reactive (01/07 1026)  WUJ:WJXBJYNW (06/27 1402) 1 hr Glucola  Abnormal, with normal 3 hr GTT normal Genetic screening normal Anatomy US normal   Past Medical History:  Diagnosis Date  . Allergy   . Asthma without status asthmaticus 03/15/2016  . Astigmatism   . GERD (gastroesophageal reflux disease)   . History of kidney stones    In the past  . Migraines   . PCOS (polycystic ovarian syndrome)   . Polycystic disease, ovaries   . Tachycardia   . Tachycardia     Past Surgical History:  Procedure Laterality Date  . DILATION AND EVACUATION N/A 04/23/2016   Procedure: DILATATION AND EVACUATION;  Surgeon: Herold Harms, MD;  Location: [facility] ORS;  Service: Gynecology;  Laterality: N/A;  . WISDOM TOOTH EXTRACTION      OB History  Gravida Para Term Preterm AB Living  2       1 0  SAB TAB Ectopic Multiple Live Births  1            # Outcome Date GA Lbr Len/2nd Weight Sex Delivery Anes PTL Lv  2 Current           1 SAB             Social History  Socioeconomic History  . Marital status: Married    Spouse name: Not on file  . Number of children: Not on file  . Years of education: Not on file  . Highest education level: Not on file  Occupational History  . Not on file  Social Needs  . Financial resource strain: Not on file  . Food insecurity:    Worry: Not on file    Inability: Not on file  . Transportation needs:    Medical: Not on file    Non-medical: Not on file  Tobacco Use  . Smoking status: Never Smoker   . Smokeless tobacco: Never Used  Substance and Sexual Activity  . Alcohol use: Yes    Alcohol/week: 0.6 oz    Types: 1 Glasses of wine per week    Comment: occassionally - not in pregnancy  . Drug use: No  . Sexual activity: Yes    Birth control/protection: None  Lifestyle  . Physical activity:    Days per week: Not on file    Minutes per session: Not on file  . Stress: Not on file  Relationships  . Social connections:    Talks on phone: Not on file    Gets together: Not on file    Attends religious service: Not on file    Active member of club or organization: Not on file    Attends meetings of clubs or organizations: Not on file    Relationship status: Not on file  Other Topics Concern  . Not on file  Social History Narrative  . Not on file    Family History  Problem Relation Age of Onset  . Hypertension Mother   . Hyperlipidemia Father   . Diabetes Father   . Asthma Brother   . Breast cancer Neg Hx   . Ovarian cancer Neg Hx   . Colon cancer Neg Hx     Medications Prior to Admission  Medication Sig Dispense Refill Last Dose  . calcium carbonate (TUMS EX) 750 MG chewable tablet Chew 1 tablet by mouth daily.   11/04/2017 at Unknown time  . docusate sodium (COLACE) 100 MG capsule Take 100 mg by mouth 2 (two) times daily.   11/04/2017 at Unknown time  . labetalol (NORMODYNE) 200 MG tablet Take 1 tablet (200 mg total) by mouth 3 (three) times daily. 30 tablet 0 11/04/2017 at Unknown time  . Prenatal Vit-Fe Fumarate-FA (PRENATAL MULTIVITAMIN) TABS tablet Take 1 tablet by mouth daily at 12 noon.   11/03/2017 at Unknown time  . Probiotic Product (PROBIOTIC-10 PO) Take by mouth.   11/04/2017 at Unknown time  . ranitidine (ZANTAC) 150 MG capsule Take 150 mg by mouth 2 (two) times daily.   11/04/2017 at Unknown time    Allergies  Allergen Reactions  . Ceclor [Cefaclor]     hives  . Codeine     Body fights itself eyes dilate  . Red Dye     Hives, nausea and migraines   .  Blackberry Flavor Nausea And Vomiting and Rash  . Raspberry Nausea And Vomiting and Rash    Review of Systems: Negative except for what is mentioned in HPI.  Physical Exam: Blood pressure (!) 162/81, pulse 71, temperature 97.9 F (36.6 C), temperature source Oral, resp. rate 18, height 5\' 2"  (1.575 m), weight 196 lb 4.8 oz (89 kg), last menstrual period 02/24/2017, SpO2 96 %.  CONSTITUTIONAL: Well-developed, well-nourished female in no acute distress.  HENT:  Normocephalic, atraumatic, External right and  left ear normal. Oropharynx is clear and moist EYES: Conjunctivae and EOM are normal. Pupils are equal, round, and reactive to light. No scleral icterus.  NECK: Normal range of motion, supple, no masses SKIN: Skin is warm and dry. No rash noted. Not diaphoretic. No erythema. No pallor. NEUROLOGIC: Alert and oriented to person, place, and time. Normal reflexes, muscle tone coordination. No cranial nerve deficit noted. PSYCHIATRIC: Normal mood and affect. Normal behavior. Normal judgment and thought content. CARDIOVASCULAR: Normal heart rate noted, regular rhythm RESPIRATORY: Effort and breath sounds normal, no problems with respiration noted ABDOMEN: Soft, nontender, nondistended, gravid. MUSCULOSKELETAL: Normal range of motion. No edema and no tenderness. 2+ distal pulses.  Cervical Exam: Dilatation 1.5 cm   Effacement 50%   Station -3   Presentation: cephalic FHT:  Baseline rate 135 bpm   Variability moderate  Accelerations present   Decelerations none Contractions: Occasional (mild, not detectable to patient)   Pertinent Labs/Studies:   Results for orders placed or performed during the hospital encounter of 11/04/17 (from the past 24 hour(s))  Type and screen     Status: None   Collection Time: 11/04/17  3:35 PM  Result Value Ref Range   ABO/RH(D) O POS    Antibody Screen NEG    Sample Expiration      11/07/2017 Performed at [facility], [address]  [place], [place] [address]   Protein / creatinine ratio, urine     Status: Abnormal   Collection Time: 11/04/17  3:48 PM  Result Value Ref Range   Creatinine, Urine 80 mg/dL   Total Protein, Urine 45 mg/dL   Protein Creatinine Ratio 0.56 (H) 0.00 - 0.15 mg/mg[Cre]    Lab Results  Component Value Date   WBC 12.4 (H) 11/04/2017   HGB 10.2 (L) 11/04/2017   HCT 30.5 (L) 11/04/2017   MCV 93 11/04/2017   PLT 151 11/04/2017   Lab Results  Component Value Date   CREATININE 0.88 11/04/2017   BUN 14 11/04/2017   NA 137 11/04/2017   K 4.1 11/04/2017   CL 103 11/04/2017   CO2 19 (L) 11/04/2017   Lab Results  Component Value Date   ALT 17 11/04/2017   AST 26 11/04/2017   ALKPHOS 94 11/04/2017   BILITOT <0.2 11/04/2017      Imaging:  US UA Cord Doppler ULTRASOUND REPORT  Location: [facility] Date of Service:  11/04/2017  Indications: BPP & UA Dopplers; IUGR Findings:  Singleton intrauterine pregnancy is visualized with FHR at 137 BPM.  Fetal presentation is vertex.  Placenta: Posterior and grade 3. AFI: 11.4 cm.  BPP: 6/8 with good visualization of fetal movement, tone, and fluid.   Fetal breathing was not observed on today's scan.  S/D ratio of UA: Placenta - 3.26 Mid - 2.73 Fetus - 2.95  50th percentile S/D ratio for [redacted] weeks gestational age is 2.39 and 95th  percentile is 3.41.   Impression: 1. BPP: 6/8 - Unable to award points for fetal breathing. 2. S/D ratios are 3.26 (placenta), 2.73 (mid), and 2.95 (fetus).  Recommendations: 1.Clinical correlation with the patient's History and Physical Exam.   Kari BaarsJill Long, RDMS  I have reviewed this study and agree with documented findings.   Hildred LaserAnika Ercel Pepitone, MD [facility] US Fetal BPP W/O Non Stress ULTRASOUND REPORT  Location: [facility] Date of Service:  11/04/2017  Indications: BPP & UA Dopplers; IUGR Findings:  Singleton intrauterine pregnancy is visualized with FHR at  137  BPM.  Fetal presentation is vertex.  Placenta: Posterior and grade 3. AFI: 11.4 cm.  BPP: 6/8 with good visualization of fetal movement, tone, and fluid.   Fetal breathing was not observed on today's scan.  S/D ratio of UA: Placenta - 3.26 Mid - 2.73 Fetus - 2.95  50th percentile S/D ratio for [redacted] weeks gestational age is 2.39 and 95th  percentile is 3.41.  Impression: 1. BPP: 6/8 - Unable to award points for fetal breathing. 2. S/D ratios are 3.26 (placenta), 2.73 (mid), and 2.95 (fetus).  Recommendations: 1.Clinical correlation with the patient's History and Physical Exam.  Kari Baars, RDMS  I have reviewed this study and agree with documented findings.   Hildred Laser, MD [facility]   11/02/2017 Inpatient Ultrasound:  IMPRESSION: 1. Single living intrauterine gestation in cephalic lie at 33 weeks 6 days by average ultrasound age, which lags the expected gestational age of [redacted] weeks 6 days by assigned EDC. 2. Estimated fetal weight 2297 g, at the 8.5 percentile, small for expected gestational age. Correlate clinically for possible IUGR. Fetal cord Doppler evaluation could be obtained as clinically warranted. 3. Normal amniotic fluid volume.  AFI 17.2. 4. No fetal abnormalities detected, with some limitations as detailed.  Assessment : Jillian Anderson is a 31 y.o. G2P0010 at [redacted]w[redacted]d being admitted for induction of labor due to pre-eclampsia with severe features (severe range BPs), IUGR.  Plan: Labor: Induction as ordered as per protocol with Cytotec. Analgesia as needed. Will initiate Magnesium sulfate once induction under way. Will manage per protocol. First dose of Cytotec placed at 5:30 pm.  FWB: Reassuring fetal heart tracing.  GBS negative Delivery plan: Hopeful for vaginal delivery   Hildred Laser, MD [facility]

## 2017-11-04 NOTE — Progress Notes (Signed)
Plan of Care:  Patient requiring multiple doses of IV medications for management of severe range BPs (has maxed dosing of Labetalol, received 2 doses of hydralazine, and now initiating Procardia).  Will start magnesium sulfate. Strict I's and O's. Will place SCDs as patient may have limited mobility while on medication.   Hildred Laserherry, Angeles Zehner, MD [facility]

## 2017-11-04 NOTE — Progress Notes (Addendum)
Intrapartum Progress Note  S: Patient complains of cramping, had bladder spasms earlier.  O:  Temp:  [97.9 F (36.6 C)] 97.9 F (36.6 C) (07/05 1505) Pulse Rate:  [61-105] 105 (07/05 1916) Resp:  [18] 18 (07/05 1505) BP: (141-173)/(71-90) 146/84 (07/05 1916) SpO2:  [96 %-99 %] 96 % (07/05 1900) Weight:  [196 lb 4.8 oz (89 kg)-196 lb 11.2 oz (89.2 kg)] 196 lb 4.8 oz (89 kg) (07/05 1505)  Gen App: NAD, comfortable Abdomen: soft, gravid FHT: baseline 130 bpm.  Accels absent.  Decels absent. moderate in degree variability.   Tocometer: contractions 5-7 irregular minutes  Cervix: 3/60/-3 Extremities: Nontender, no edema.   Neuro: no clonus, reflexes +2 throughout.  Labs:   Results for Jillian Anderson, Jillian Anderson (MRN [id number]) as of 11/04/2017 22:11  Ref. Range 11/04/2017 15:48  Total Protein, Urine Latest Units: mg/dL 45  Protein Creatinine Ratio Latest Ref Range: 0.00 - 0.15 mg/mgCre 0.56 (H)  Creatinine, Urine Latest Units: mg/dL 80     Assessment:  1: SIUP at 5913w1d 2. Pre-eclampsia with severe features 3. IUGR 4. Bladder spasms  Plan:  1. Continue with IOL. Next dose of Cytotec given at 2130. Pain management as desired.Just received 1st dose of Stadol 2. Continue Magnesium sulfate. Continue mag checks.  3. Uribel prn for bladder spasms 4. SCDs for DVT prophylaxis.   Hildred Laserherry, Jennise Both, MD 11/04/2017 9:56 PM

## 2017-11-04 NOTE — Progress Notes (Signed)
ROB: Patient following up after hospital admission (observation) for newly diagnosed pre-eclampsia. Is s/p recent course of antenatal steroids.  She currently denies complaints. Notes compliance with Labetalol for blood pressures. Ultrasound performed today notes normal dopplers, BPP 6/8, but NST reactive so score 8/10. To f/u in 3-4 days for OB visit, NST, and in 1 week for Dopplers/NST.Will send back to L&D for further evaluation of BPs and likely induction of labor.  NONSTRESS TEST INTERPRETATION  INDICATIONS: BPP 6/8, Pre-eclampsia, and  IUGR  FHR baseline: 130 bpm RESULTS:Reactive COMMENTS: No contractions. BPs 172/83, 187/85, 170/93, 178/90, 177/89, 183/93.    PLAN: 1. Continue fetal kick counts twice a day. 2. Will send to Labor and Delivery for further evaluation and likely induction of labor.

## 2017-11-04 NOTE — Progress Notes (Signed)
Pt stated that she is doing well no complaints.  

## 2017-11-05 DIAGNOSIS — O1490 Unspecified pre-eclampsia, unspecified trimester: Secondary | ICD-10-CM

## 2017-11-05 DIAGNOSIS — O365990 Maternal care for other known or suspected poor fetal growth, unspecified trimester, not applicable or unspecified: Secondary | ICD-10-CM

## 2017-11-05 DIAGNOSIS — O1414 Severe pre-eclampsia complicating childbirth: Principal | ICD-10-CM

## 2017-11-05 DIAGNOSIS — Z3A36 36 weeks gestation of pregnancy: Secondary | ICD-10-CM

## 2017-11-05 DIAGNOSIS — O365930 Maternal care for other known or suspected poor fetal growth, third trimester, not applicable or unspecified: Secondary | ICD-10-CM

## 2017-11-05 LAB — PLATELET COUNT: Platelets: 151 10*3/uL (ref 150–440)

## 2017-11-05 LAB — RPR: RPR Ser Ql: NONREACTIVE

## 2017-11-05 LAB — MAGNESIUM: Magnesium: 6.7 mg/dL (ref 1.7–2.4)

## 2017-11-05 MED ORDER — LACTATED RINGERS IV SOLN: INTRAVENOUS | Status: DC

## 2017-11-05 MED ORDER — ACETAMINOPHEN 325 MG PO TABS: 650.0000 mg | ORAL_TABLET | ORAL | Status: DC | PRN

## 2017-11-05 MED ORDER — TETANUS-DIPHTH-ACELL PERTUSSIS 5-2.5-18.5 LF-MCG/0.5 IM SUSP
0.5000 mL | Freq: Once | INTRAMUSCULAR | Status: DC
  Filled 2017-11-05: qty 0.5

## 2017-11-05 MED ORDER — MAGNESIUM SULFATE 40 G IN LACTATED RINGERS - SIMPLE
2.0000 g/h | INTRAVENOUS | Status: AC
  Filled 2017-11-05 (×2): qty 500

## 2017-11-05 MED ORDER — HYDRALAZINE HCL 20 MG/ML IJ SOLN
5.0000 mg | INTRAMUSCULAR | Status: AC | PRN
  Administered 2017-11-05: 5 mg via INTRAVENOUS
  Administered 2017-11-06: 10 mg via INTRAVENOUS
  Filled 2017-11-05: qty 1

## 2017-11-05 MED ORDER — ONDANSETRON HCL 4 MG PO TABS: 4.0000 mg | ORAL_TABLET | ORAL | Status: DC | PRN

## 2017-11-05 MED ORDER — CALCIUM CARBONATE ANTACID 500 MG PO CHEW
1.0000 | CHEWABLE_TABLET | Freq: Every day | ORAL | Status: DC
  Administered 2017-11-05 – 2017-11-07 (×3): 200 mg via ORAL
  Filled 2017-11-05 (×4): qty 1

## 2017-11-05 MED ORDER — DOCUSATE SODIUM 100 MG PO CAPS
100.0000 mg | ORAL_CAPSULE | Freq: Two times a day (BID) | ORAL | Status: DC
  Administered 2017-11-05 – 2017-11-07 (×6): 100 mg via ORAL
  Filled 2017-11-05 (×7): qty 1

## 2017-11-05 MED ORDER — DIPHENHYDRAMINE HCL 25 MG PO CAPS: 25.0000 mg | ORAL_CAPSULE | Freq: Four times a day (QID) | ORAL | Status: DC | PRN

## 2017-11-05 MED ORDER — COCONUT OIL OIL
1.0000 "application " | TOPICAL_OIL | Status: DC | PRN
  Administered 2017-11-05: 1 via TOPICAL
  Filled 2017-11-05: qty 120

## 2017-11-05 MED ORDER — FENTANYL 2.5 MCG/ML W/ROPIVACAINE 0.15% IN NS 100 ML EPIDURAL (ARMC)
EPIDURAL | Status: AC
  Filled 2017-11-05: qty 100

## 2017-11-05 MED ORDER — DIBUCAINE 1 % RE OINT: 1.0000 "application " | TOPICAL_OINTMENT | RECTAL | Status: DC | PRN

## 2017-11-05 MED ORDER — CALCIUM CARBONATE ANTACID 750 MG PO CHEW: 1.0000 | CHEWABLE_TABLET | Freq: Every day | ORAL | Status: DC

## 2017-11-05 MED ORDER — BENZOCAINE-MENTHOL 20-0.5 % EX AERO
1.0000 "application " | INHALATION_SPRAY | CUTANEOUS | Status: DC | PRN
  Filled 2017-11-05: qty 56

## 2017-11-05 MED ORDER — LACTATED RINGERS IV SOLN
INTRAVENOUS | Status: DC
  Administered 2017-11-05 – 2017-11-06 (×2): via INTRAVENOUS

## 2017-11-05 MED ORDER — IBUPROFEN 600 MG PO TABS
600.0000 mg | ORAL_TABLET | Freq: Four times a day (QID) | ORAL | Status: DC
  Administered 2017-11-05 – 2017-11-07 (×10): 600 mg via ORAL
  Filled 2017-11-05 (×10): qty 1

## 2017-11-05 MED ORDER — SIMETHICONE 80 MG PO CHEW: 80.0000 mg | CHEWABLE_TABLET | ORAL | Status: DC | PRN

## 2017-11-05 MED ORDER — ONDANSETRON HCL 4 MG/2ML IJ SOLN
4.0000 mg | INTRAMUSCULAR | Status: DC | PRN
Start: 2017-11-05 — End: 2017-11-08

## 2017-11-05 MED ORDER — HYDRALAZINE HCL 20 MG/ML IJ SOLN
INTRAMUSCULAR | Status: AC
  Administered 2017-11-05: 5 mg via INTRAVENOUS
  Filled 2017-11-05: qty 1

## 2017-11-05 MED ORDER — NIFEDIPINE 10 MG PO CAPS: 10.0000 mg | ORAL_CAPSULE | Freq: Once | ORAL | Status: DC

## 2017-11-05 MED ORDER — WITCH HAZEL-GLYCERIN EX PADS: 1.0000 "application " | MEDICATED_PAD | CUTANEOUS | Status: DC | PRN

## 2017-11-05 MED ORDER — ZOLPIDEM TARTRATE 5 MG PO TABS: 5.0000 mg | ORAL_TABLET | Freq: Every evening | ORAL | Status: DC | PRN

## 2017-11-05 MED ORDER — PRENATAL MULTIVITAMIN CH
1.0000 | ORAL_TABLET | Freq: Every day | ORAL | Status: DC
  Administered 2017-11-06 – 2017-11-07 (×2): 1 via ORAL
  Filled 2017-11-05 (×2): qty 1

## 2017-11-05 MED ORDER — FENTANYL CITRATE (PF) 100 MCG/2ML IJ SOLN: 100.0000 ug | INTRAMUSCULAR | Status: DC | PRN

## 2017-11-05 NOTE — Lactation Note (Signed)
This note was copied from a baby's chart. Lactation Consultation Note  Patient Name: Jillian Anderson WFUXN'AToday's Date: 11/05/2017 Reason for consult: Primapara;Late-preterm 34-36.6wks;Infant < 6lbs;NICU baby;Other (Comment)(Low blood glucoses) [name] is in SCN.  Mom purchased Spectra pump.  Mom wanted to pump with her own pump the first time to learn how to use it and expressed 5 ml.  Willing to use Symphony for next pumping.  Symphony DEBP set up in room with instructions in pumping, collection, labeling, storing and handling of colostrum/breast milk.  Encouraged mom to call for questions, concerns or assistance.  Lactation name and number on white board.  Maternal Data Formula Feeding for Exclusion: No Has patient been taught Hand Expression?: Yes Does the patient have breastfeeding experience prior to this delivery?: No  Feeding Feeding Type: Formula Nipple Type: Slow - flow Length of feed: 20 min  LATCH Score                   Interventions    Lactation Tools Discussed/Used Tools: Pump Breast pump type: Double-Electric Breast Pump(Mom wanting to use own Spectra 1st time - Set up Symphony in room with instructions) Pump Review: Setup, frequency, and cleaning;Milk Storage Date initiated:: 11/05/17   Consult Status Consult Status: Follow-up Date: 11/05/17 Follow-up type: Call as needed    Louis MeckelWilliams, Josey Dettmann Kay 11/05/2017, 5:21 PM

## 2017-11-05 NOTE — Lactation Note (Signed)
This note was copied from a baby's chart. Lactation Consultation Note  Patient Name: Jillian Anderson XBMWU'XToday's Date: 11/05/2017 Reason for consult: Initial assessment;Primapara;Late-preterm 34-36.6wks;1st time breastfeeding;Infant < 6lbs;Mother's request;Difficult latch;MD order Assisted mom to breast feed skin to skin on left breast in cradle hold.  Could obtain latch, but no sucking elicited.  With nipple shield took a couple of weak sucks, but no swallowing,  Blood glucose was 25.  Neonatologist ordered baby to get 10 to 15 ml of 22 calorie formula.  Given via curved tip syringe along side of nipple shield with more weak sucking and swallowing.  Was spitting out some as Theone Murdochli was feeding.  Blood sugar came up to only 37.  Left [name] skin to skin with mom.  When next glucose was 33 [facility]  Will follow up with mom.  Maternal Data Formula Feeding for Exclusion: No Has patient been taught Hand Expression?: Yes Does the patient have breastfeeding experience prior to this delivery?: No  Feeding Feeding Type: Breast Fed Length of feed: 15 min  LATCH Score Latch: Too sleepy or reluctant, no latch achieved, no sucking elicited.  Audible Swallowing: A few with stimulation  Type of Nipple: Flat  Comfort (Breast/Nipple): Soft / non-tender  Hold (Positioning): Full assist, staff holds infant at breast  LATCH Score: 4  Interventions Interventions: Breast feeding basics reviewed;Reverse pressure;Assisted with latch;Breast compression;Skin to skin;Adjust position;Breast massage;Support pillows;Hand express;Position options  Lactation Tools Discussed/Used Tools: Nipple Dorris CarnesShields;Other (comment)(Curved tip syringe along side of NS at breast) Nipple shield size: 20   Consult Status Consult Status: Follow-up Date: 11/05/17 Follow-up type: In-patient    Louis MeckelWilliams, Yazleemar Strassner Kay 11/05/2017, 12:01 PM

## 2017-11-05 NOTE — Lactation Note (Signed)
This note was copied from a baby's chart. Lactation Consultation Note  Patient Name: Jillian Anderson   Assisted mom with pumping with Symphony pump.  Demonstrated hand expression, breast massage and pumping to FOB so he could help through the night.  Mom expressed 10 ml which is double what she pumped first time. Praised parents for commitment to supply breast milk for [name] in SCN.  Maternal Data    Feeding Feeding Type: Bottle Fed - Formula Nipple Type: Slow - flow Length of feed: 20 min  LATCH Score                   Interventions    Lactation Tools Discussed/Used     Consult Status      Jillian Anderson, Jillian Anderson Anderson, 8:06 PM

## 2017-11-05 NOTE — Progress Notes (Signed)
Intrapartum Progress Note  S: Patient com planing of more painful contractions  O:  Temp:  [97.9 F (36.6 C)-98.3 F (36.8 C)] 98.2 F (36.8 C) (07/06 0236) Pulse Rate:  [61-105] 100 (07/06 0406) Resp:  [17-18] 18 (07/06 0236) BP: (135-173)/(71-99) 154/92 (07/06 0406) SpO2:  [92 %-99 %] 94 % (07/06 0355) Weight:  [196 lb 4.8 oz (89 kg)-196 lb 11.2 oz (89.2 kg)] 196 lb 4.8 oz (89 kg) (07/05 1505)  Gen App: NAD, uncomfortable with contractions.  Abdomen: soft, gravid FHT: baseline 120 bpm.  Accels absent.  Decels present (at approximately 0248 patient had 3 repetitive variables from baseline 130s to 70s, and a subtle late deceleration at 0348. minimal in degree variability.    Tocometer: contractions q 1-2 minutes Cervix: 4/80/0. AROM'd with small amount of light meconium fluid. Extremities: Nontender, no edema.  Labs:  No new labs   Assessment:  1: SIUP at 4678w2d 2. Pre-eclampsia with severe features 3. IUGR 4. Category 2 tracing  Plan:  1. Continue Magnesium sulfate for pre-eclampsia 2. Had received 2 doses of Cytotec, with plans to proceed to Pitocin for augmentation. Could not proceed with induction due to Category 2 tracing.  Given dose of terbutaline x1 to slow contractions to allow for improvement of fetal tracing.  3. AROM'd with light meconium stained fluid. IUPC and FSC placed for better fetal assessment.  4. Discussion had with patient regarding fetal tracing. Several hours with minimal variability and with decelerations present around 2:45 a.m. At my arrival, variability had begun to improve and several early decelerations present. She has been treated with facemask O2, left lateral tilt, IV pain medication changed to nitrous oxide gas. With internal monitors placed, discussed very low threshold for continued Category 2 tracing, especially now in light meconium fluid in the setting of IUGR. If tracing has not improved within the next 30 minutes, discussed that we would  likely proceed with C-section as mode of delivery. Patient and husband note understanding.    Hildred Laserherry, Sandford Diop, MD 11/05/2017 4:53 AM

## 2017-11-06 LAB — CBC
HCT: 30.8 % — ABNORMAL LOW (ref 35.0–47.0)
HEMOGLOBIN: 10.6 g/dL — AB (ref 12.0–16.0)
MCH: 32.4 pg (ref 26.0–34.0)
MCHC: 34.5 g/dL (ref 32.0–36.0)
MCV: 94.1 fL (ref 80.0–100.0)
Platelets: 148 10*3/uL — ABNORMAL LOW (ref 150–440)
RBC: 3.28 MIL/uL — ABNORMAL LOW (ref 3.80–5.20)
RDW: 14.3 % (ref 11.5–14.5)
WBC: 11.7 10*3/uL — AB (ref 3.6–11.0)

## 2017-11-06 MED ORDER — NIFEDIPINE ER OSMOTIC RELEASE 30 MG PO TB24
30.0000 mg | ORAL_TABLET | Freq: Two times a day (BID) | ORAL | Status: DC
  Administered 2017-11-06 – 2017-11-07 (×3): 30 mg via ORAL
  Filled 2017-11-06 (×3): qty 1

## 2017-11-06 MED ORDER — NIFEDIPINE 10 MG PO CAPS
30.0000 mg | ORAL_CAPSULE | ORAL | Status: AC
  Administered 2017-11-06: 30 mg via ORAL
  Filled 2017-11-06: qty 3

## 2017-11-06 NOTE — Lactation Note (Signed)
This note was copied from a baby's chart. Lactation Consultation Note  Patient Name: Jillian Anderson SewerChristina Keisling WUJWJ'XToday's Date: 11/06/2017 Reason for consult: Follow-up assessment;Late-preterm 34-36.6wks;Primapara;Mother's request;Difficult latch;Other (Comment)(IUGR, Came out from [facility] today at 1230pm to begin BF again) [facility] with Jillian Anderson for feedings at 1230 pm.  Jillian Anderson is committed and eager to work with getting him back to the breast.  Jillian Anderson's plan so far has been to put Jillian Anderson to the breast using nipple shield.  For supplementation, we have been giving any expressed colostrum along with 22 cal Enfamil via curved tip syringe along side of #20 nipple shield. Jillian Anderson is pumping after feeding to have some expressed colostrum to supplement for next feeding. For this feeding, we gave Jillian Anderson's pumped colostrum (3 ml) once again via curved tip syringe along side of nipple shield at the breast.  Theone Murdochli has been pushing nipple shield out with his tongue at this feeding only wanting to get on tip of NS and falling asleep at the breast.  Whenever we give any supplement he begins good rhythmic sucking again.  Decided to try SNS with added 22 cal formula which he tolerated well and took 10 ml before falling asleep. Jillian Anderson willing to keep working with SNS with assistance from RN or LC every 3 hours.  If Jillian Anderson is too exhausted tonight, she has the option of BF and and then giving added 22 calorie formula via bottle with slow flow nipple.   Maternal Data Formula Feeding for Exclusion: Yes Reason for exclusion: (Supplementation) Has patient been taught Hand Expression?: Yes Does the patient have breastfeeding experience prior to this delivery?: No  Feeding Feeding Type: Breast Fed Length of feed: 15 min  LATCH Score Latch: Repeated attempts needed to sustain latch, nipple held in mouth throughout feeding, stimulation needed to elicit sucking reflex.  Audible Swallowing: A few with stimulation  Type of Nipple:  Everted at rest and after stimulation  Comfort (Breast/Nipple): Filling, red/small blisters or bruises, mild/mod discomfort  Hold (Positioning): Assistance needed to correctly position infant at breast and maintain latch.  LATCH Score: 6  Interventions Interventions: DEBP;Assisted with latch;Breast compression;Coconut oil;Skin to skin;Adjust position;Breast massage;Support pillows;Comfort gels;Hand express;Position options  Lactation Tools Discussed/Used Tools: Nipple Shields;Pump;Supplemental Nutrition System;Coconut oil;Comfort gels Nipple shield size: 20 Breast pump type: Double-Electric Breast Pump Pump Review: Setup, frequency, and cleaning;Milk Storage Initiated by:: S.Tavish Gettis,RN,BSN,IBCLC Date initiated:: 11/05/17   Consult Status Consult Status: Follow-up Follow-up type: Call as needed    Louis Anderson, Jillian Anderson 11/06/2017, 10:44 PM

## 2017-11-06 NOTE — Plan of Care (Signed)
Alert and oriented with quiet affect. Denies PIH S/S. B/P Borderline elevated; within call parameters. Denies pain and is receiving scheduled Ibuprofen as ordered.  Appears to be bonding well with Infant. Will cont. To follow closely.

## 2017-11-06 NOTE — Progress Notes (Signed)
Post Partum Day # 1, s/p SVD. Pre-eclampsia with severe features, IUGR  Subjective: no complaints, up ad lib, voiding and tolerating PO  Objective: Temp:  [98.2 F (36.8 C)] 98.2 F (36.8 C) (07/07 1215) Pulse Rate:  [78-114] 112 (07/07 1215) Resp:  [16-18] 18 (07/07 1215) BP: (126-173)/(76-104) 133/83 (07/07 1215) SpO2:  [93 %-98 %] 98 % (07/07 1215)  Physical Exam:  General: alert and no distress  Lungs: clear to auscultation bilaterally Breasts: normal appearance, no masses or tenderness Heart: regular rate and rhythm, S1, S2 normal, no murmur, click, rub or gallop Abdomen: soft, non-tender; bowel sounds normal; no masses,  no organomegaly Pelvis: Lochia: appropriate, Uterine Fundus: firm Extremities: DVT Evaluation: No evidence of DVT seen on physical exam. Negative Homan's sign. No cords or calf tenderness. No significant calf/ankle edema.  Recent Labs    11/04/17 1230 11/06/17 0558  HGB 10.2* 10.6*  HCT 30.5* 30.8*    Assessment/Plan: Doing well postpartum Breastfeeding, Lactation consult  Contraception undecided, possibly ParaGard IUD Discontinued Mag Sulfate at 0600.  BPs were still elevated, started on Procardia 30 mg BID. Anemia of pregnancy, continue with PO iron in prenatal vitamins. Asymptomatic.  Plan for discharge tomorrow if BPs remain stable    LOS: 2 days   Hildred Laserherry, Elgar Scoggins, MD [facility] 11/06/2017 12:30 PM

## 2017-11-07 MED ORDER — IBUPROFEN 800 MG PO TABS: 800.0000 mg | ORAL_TABLET | Freq: Three times a day (TID) | ORAL | 1 refills | Status: DC | PRN

## 2017-11-07 MED ORDER — NIFEDIPINE ER 30 MG PO TB24: 30.0000 mg | ORAL_TABLET | Freq: Two times a day (BID) | ORAL | 1 refills | Status: DC

## 2017-11-07 NOTE — Progress Notes (Signed)
Post Partum Day # 2, s/p SVD. Pre-eclampsia with severe features, IUGR  Subjective: no complaints, up ad lib, voiding and tolerating PO  Objective: Temp:  [98.2 F (36.8 C)-98.5 F (36.9 C)] 98.2 F (36.8 C) (07/08 0759) Pulse Rate:  [96-114] 96 (07/08 0759) Resp:  [18-20] 18 (07/08 0759) BP: (126-155)/(83-100) 155/93 (07/08 0759) SpO2:  [97 %-98 %] 97 % (07/08 0759)  Physical Exam:  General: alert and no distress  Lungs: clear to auscultation bilaterally Breasts: normal appearance, no masses or tenderness Heart: regular rate and rhythm, S1, S2 normal, no murmur, click, rub or gallop Abdomen: soft, non-tender; bowel sounds normal; no masses,  no organomegaly Pelvis: Lochia: appropriate, Uterine Fundus: firm Extremities: DVT Evaluation: No evidence of DVT seen on physical exam. Negative Homan's sign. No cords or calf tenderness. No significant calf/ankle edema.  Recent Labs    11/04/17 1230 11/06/17 0558  HGB 10.2* 10.6*  HCT 30.5* 30.8*    Assessment/Plan: Doing well postpartum Breastfeeding, s/p Lactation consult  Contraception possibly contraceptive patch.  Continue Procardia 30 mg BID. Anemia of pregnancy, continue with PO iron in prenatal vitamins. Asymptomatic.  Plan for discharge today.    LOS: 3 days   Hildred Laserherry, Alley Neils, MD [facility] 11/07/2017 9:22 AM

## 2017-11-07 NOTE — Discharge Summary (Signed)
OB Discharge Summary     Patient Name: Jillian Anderson DOB: 1985-08-08 MRN: [id number]  Date of admission: 11/04/2017 Delivering MD: Alain Marion   Date of discharge: 11/07/2017  Admitting diagnosis:   36 wks preg induction Intrauterine pregnancy: [redacted]w[redacted]d     Secondary diagnosis:  Active Problems:   Obesity (BMI 30.0-34.9)   Pe (pre-eclampsia), third trimester   IUGR (intrauterine growth restriction) affecting care of mother   Additional problems: Anemia of pregnancy     Discharge diagnosis: Term Pregnancy Delivered, Preeclampsia (severe) and Anemia                                                                                                Post partum procedures:None  Augmentation: AROM and Cytotec  Complications: None  Hospital course:  Induction of Labor With Vaginal Delivery   32 y.o. yo G2P0111 at [redacted]w[redacted]d was admitted to the hospital 11/04/2017 for induction of labor.  Indication for induction: Preeclampsia.  Patient had an uncomplicated labor course as follows: Membrane Rupture Time/Date: 4:44 AM ,11/05/2017   Intrapartum Procedures: Episiotomy: None [1]                                         Lacerations:  1st degree [2];Perineal [11]  Patient had delivery of a Viable infant.  Information for the patient's newborn:  Keyundra, Fant [[id number]]  Delivery Method: Vag-Spont   11/05/2017  Details of delivery can be found in separate delivery note.  Patient had a routine postpartum course. Patient is discharged home 11/07/17.  Physical exam  Vitals:   11/06/17 2334 11/06/17 2358 11/07/17 0330 11/07/17 0759  BP: (!) 136/100 (!) 139/91 (!) 145/95 (!) 155/93  Pulse: (!) 109 99 (!) 102 96  Resp: 20  20 18   Temp: 98.4 F (36.9 C)  98.5 F (36.9 C) 98.2 F (36.8 C)  TempSrc: Oral  Oral Oral  SpO2: 98% 97% 98% 97%  Weight:      Height:       General: alert and no distress Lochia: appropriate Uterine Fundus: firm Incision: N/A DVT Evaluation: No evidence of  DVT seen on physical exam. Negative Homan's sign. No cords or calf tenderness. No significant calf/ankle edema. Labs: Lab Results  Component Value Date   WBC 11.7 (H) 11/06/2017   HGB 10.6 (L) 11/06/2017   HCT 30.8 (L) 11/06/2017   MCV 94.1 11/06/2017   PLT 148 (L) 11/06/2017   CMP Latest Ref Rng & Units 11/04/2017  Glucose 65 - 99 mg/dL 78  BUN 6 - 20 mg/dL 14  Creatinine 3.08 - 6.57 mg/dL 8.46  Sodium 962 - 952 mmol/L 137  Potassium 3.5 - 5.2 mmol/L 4.1  Chloride 96 - 106 mmol/L 103  CO2 20 - 29 mmol/L 19(L)  Calcium 8.7 - 10.2 mg/dL 12.3(H)  Total Protein 6.0 - 8.5 g/dL 6.2  Total Bilirubin 0.0 - 1.2 mg/dL <8.4  Alkaline Phos 39 - 117 IU/L 94  AST 0 - 40 IU/L 26  ALT 0 - 32 IU/L 17    Discharge instruction: per After Visit Summary and "Baby and Me Booklet".  After visit meds:  Allergies as of 11/07/2017      Reactions   Ceclor [cefaclor]    hives   Codeine    Body fights itself eyes dilate   Red Dye    Hives, nausea and migraines   Blackberry Flavor Nausea And Vomiting, Rash   Raspberry Nausea And Vomiting, Rash      Medication List    STOP taking these medications   calcium carbonate 750 MG chewable tablet Commonly known as:  TUMS EX   labetalol 200 MG tablet Commonly known as:  NORMODYNE   ranitidine 150 MG capsule Commonly known as:  ZANTAC     TAKE these medications   docusate sodium 100 MG capsule Commonly known as:  COLACE Take 100 mg by mouth 2 (two) times daily.   ibuprofen 800 MG tablet Commonly known as:  ADVIL,MOTRIN Take 1 tablet (800 mg total) by mouth every 8 (eight) hours as needed.   NIFEdipine 30 MG 24 hr tablet Commonly known as:  PROCARDIA-XL/ADALAT CC Take 1 tablet (30 mg total) by mouth 2 (two) times daily.   prenatal multivitamin Tabs tablet Take 1 tablet by mouth daily at 12 noon.   PROBIOTIC-10 PO Take by mouth.       Diet: routine diet  Activity: Advance as tolerated. Pelvic rest for 6 weeks.   Outpatient  follow up:3 days for BP check, 6 weeks postpartum visit Follow up Appt:No future appointments. Follow up Visit:No follow-ups on file.  Postpartum contraception: Contraceptive patch  Newborn Data: Live born female  Birth Weight: 4 lb 10.8 oz (2120 g) APGAR: 8, 9  Newborn Delivery   Birth date/time:  11/05/2017 05:45:00 Delivery type:  Vaginal, Spontaneous     Baby Feeding: Bottle and Breast Disposition:home with mother   11/07/2017 Hildred LaserAnika Zamariah Seaborn, MD [facility]

## 2017-11-07 NOTE — Progress Notes (Signed)
Virgel ManifoldNotify Susan, RN

## 2017-11-07 NOTE — Discharge Summary (Deleted)
  The note originally documented on this encounter has been moved the the encounter in which it belongs.  

## 2017-11-07 NOTE — Progress Notes (Signed)
All discharge instructions reviewed with pt at 2230; pt has her own pain meds to take; pt will have family pick up BP med tomorrow and bring to her; breast feeding and pumping and the baby gets supplemented with formula; pt and husband doing a great job with this; pt will be "discharged" but staying in room 339 as her baby will still be a patient

## 2017-11-07 NOTE — Progress Notes (Signed)
Alert and oriented with cheerful affect. Color good, skin w&d. No PIH s/s except borderline high B/P's that are within call orders. Pt. Has denied pain or any other c/o.

## 2017-11-07 NOTE — Discharge Instructions (Signed)
Vaginal Delivery, Care After °Refer to this sheet in the next few weeks. These instructions provide you with information about caring for yourself after vaginal delivery. Your health care provider may also give you more specific instructions. Your treatment has been planned according to current medical practices, but problems sometimes occur. Call your health care provider if you have any problems or questions. °What can I expect after the procedure? °After vaginal delivery, it is common to have: °· Some bleeding from your vagina. °· Soreness in your abdomen, your vagina, and the area of skin between your vaginal opening and your anus (perineum). °· Pelvic cramps. °· Fatigue. ° °Follow these instructions at home: °Medicines °· Take over-the-counter and prescription medicines only as told by your health care provider. °· If you were prescribed an antibiotic medicine, take it as told by your health care provider. Do not stop taking the antibiotic until it is finished. °Driving ° °· Do not drive or operate heavy machinery while taking prescription pain medicine. °· Do not drive for 24 hours if you received a sedative. °Lifestyle °· Do not drink alcohol. This is especially important if you are breastfeeding or taking medicine to relieve pain. °· Do not use tobacco products, including cigarettes, chewing tobacco, or e-cigarettes. If you need help quitting, ask your health care provider. °Eating and drinking °· Drink at least 8 eight-ounce glasses of water every day unless you are told not to by your health care provider. If you choose to breastfeed your baby, you may need to drink more water than this. °· Eat high-fiber foods every day. These foods may help prevent or relieve constipation. High-fiber foods include: °? Whole grain cereals and breads. °? Brown rice. °? Beans. °? Fresh fruits and vegetables. °Activity °· Return to your normal activities as told by your health care provider. Ask your health care provider  what activities are safe for you. °· Rest as much as possible. Try to rest or take a nap when your baby is sleeping. °· Do not lift anything that is heavier than your baby or 10 lb (4.5 kg) until your health care provider says that it is safe. °· Talk with your health care provider about when you can engage in sexual activity. This may depend on your: °? Risk of infection. °? Rate of healing. °? Comfort and desire to engage in sexual activity. °Vaginal Care °· If you have an episiotomy or a vaginal tear, check the area every day for signs of infection. Check for: °? More redness, swelling, or pain. °? More fluid or blood. °? Warmth. °? Pus or a bad smell. °· Do not use tampons or douches until your health care provider says this is safe. °· Watch for any blood clots that may pass from your vagina. These may look like clumps of dark red, brown, or black discharge. °General instructions °· Keep your perineum clean and dry as told by your health care provider. °· Wear loose, comfortable clothing. °· Wipe from front to back when you use the toilet. °· Ask your health care provider if you can shower or take a bath. If you had an episiotomy or a perineal tear during labor and delivery, your health care provider may tell you not to take baths for a certain length of time. °· Wear a bra that supports your breasts and fits you well. °· If possible, have someone help you with household activities and help care for your baby for at least a few days after   you leave the hospital. °· Keep all follow-up visits for you and your baby as told by your health care provider. This is important. °Contact a health care provider if: °· You have: °? Vaginal discharge that has a bad smell. °? Difficulty urinating. °? Pain when urinating. °? A sudden increase or decrease in the frequency of your bowel movements. °? More redness, swelling, or pain around your episiotomy or vaginal tear. °? More fluid or blood coming from your episiotomy or  vaginal tear. °? Pus or a bad smell coming from your episiotomy or vaginal tear. °? A fever. °? A rash. °? Little or no interest in activities you used to enjoy. °? Questions about caring for yourself or your baby. °· Your episiotomy or vaginal tear feels warm to the touch. °· Your episiotomy or vaginal tear is separating or does not appear to be healing. °· Your breasts are painful, hard, or turn red. °· You feel unusually sad or worried. °· You feel nauseous or you vomit. °· You pass large blood clots from your vagina. If you pass a blood clot from your vagina, save it to show to your health care provider. Do not flush blood clots down the toilet without having your health care provider look at them. °· You urinate more than usual. °· You are dizzy or light-headed. °· You have not breastfed at all and you have not had a menstrual period for 12 weeks after delivery. °· You have stopped breastfeeding and you have not had a menstrual period for 12 weeks after you stopped breastfeeding. °Get help right away if: °· You have: °? Pain that does not go away or does not get better with medicine. °? Chest pain. °? Difficulty breathing. °? Blurred vision or spots in your vision. °? Thoughts about hurting yourself or your baby. °· You develop pain in your abdomen or in one of your legs. °· You develop a severe headache. °· You faint. °· You bleed from your vagina so much that you fill two sanitary pads in one hour. °This information is not intended to replace advice given to you by your health care provider. Make sure you discuss any questions you have with your health care provider. °Document Released: 04/16/2000 Document Revised: 10/01/2015 Document Reviewed: 05/04/2015 °Elsevier Interactive Patient Education © 2018 [employer] ° °

## 2017-11-07 NOTE — Lactation Note (Signed)
This note was copied from a baby's chart. Lactation Consultation Note  Patient Name: Jillian Danton SewerChristina Anderson ZOXWR'UToday's Date: 11/07/2017     Maternal Data  Mom wants to know if she can bottlefeed some or all of formula supplement, baby falls asleep at breast, with SNS, mom informed that she may give supplement per slow flow nipple in bottle, supplement increased to 20 cc, using nipple shield for feedings, mom shown how to get deeper latch onto nipple shield, give EBM by syringe if small volumes obtained and do not mix into formula so baby is assured of getting the EBM Feeding  tires easily at breast, fussy when trying to latch, cannot organize latch well at first until calmed  [facility]                   Interventions    Lactation Tools Discussed/Used  pump at least every other feeding, mom getting exhausted with nursing, SNS, pumping and supplementing every 2 hrs., or pump and give EBM/formula if baby sluggish at breast and tiring easily   Consult Status      Dyann KiefMarsha D Danarius Mcconathy 11/07/2017, 7:55 PM

## 2017-11-09 NOTE — Discharge Summary (Signed)
OB Discharge Summary     Patient Name: Jillian Anderson DOB: Sep 05, 1985 MRN: [id number]  Date of admission: 11/04/2017 Delivering MD: Jillian Anderson   Date of discharge: 11/09/2017  Admitting diagnosis:   36 wks preg induction Intrauterine pregnancy: 3077w2d     Secondary diagnosis:  Active Problems:   Obesity (BMI 30.0-34.9)   Pe (pre-eclampsia), third trimester   IUGR (intrauterine growth restriction) affecting care of mother  Additional problems: None     Discharge diagnosis: Term Pregnancy Delivered, Preeclampsia (severe) and IUGR                                                                                                Post partum procedures:None  Augmentation: AROM and Cytotec  Complications: None  Hospital course:  Induction of Labor With Vaginal Delivery   32 y.o. yo G2P0111 at 6277w2d was admitted to the hospital 11/04/2017 for induction of labor.  Indication for induction: Preeclampsia and IUGR.   She was treated with Magnesium sulfate until 24 hrs post-delivery. Patientotherwise had an uncomplicated labor course as follows: Membrane Rupture Time/Date: 4:44 AM ,11/05/2017   Intrapartum Procedures: Episiotomy: None [1]                                         Lacerations:  1st degree [2];Perineal [11]  Patient had delivery of a Viable infant.  Information for the patient's newborn:  Jillian Anderson, Boy Jillian Anderson [[id number]][[id number]]  Delivery Method: Vag-Spont   11/05/2017  Details of delivery can be found in separate delivery note.  Patient had a routine postpartum course. Patient is discharged home 11/09/17. She will be discharged home on Procardia 30 mg BID to help manage BPs.   Physical exam  Vitals:   11/07/17 1620 11/07/17 1635 11/07/17 1649 11/07/17 1942  BP: (!) 165/90 (!) 142/85 138/90 (!) 156/87  Pulse: 98   99  Resp: 18   18  Temp: 98.1 F (36.7 C)   98.6 F (37 C)  TempSrc: Oral   Oral  SpO2: 99%   98%  Weight:      Height:       General: alert and no  distress Lochia: appropriate Uterine Fundus: firm Incision: N/A DVT Evaluation: No evidence of DVT seen on physical exam. Negative Homan's sign. No cords or calf tenderness. No significant calf/ankle edema. Labs: Lab Results  Component Value Date   WBC 11.7 (H) 11/06/2017   HGB 10.6 (L) 11/06/2017   HCT 30.8 (L) 11/06/2017   MCV 94.1 11/06/2017   PLT 148 (L) 11/06/2017   CMP Latest Ref Rng & Units 11/04/2017  Glucose 65 - 99 mg/dL 78  BUN 6 - 20 mg/dL 14  Creatinine 1.320.57 - 4.401.00 mg/dL 1.020.88  Sodium 725134 - 366144 mmol/L 137  Potassium 3.5 - 5.2 mmol/L 4.1  Chloride 96 - 106 mmol/L 103  CO2 20 - 29 mmol/L 19(L)  Calcium 8.7 - 10.2 mg/dL 12.3(H)  Total Protein 6.0 - 8.5 g/dL 6.2  Total Bilirubin 0.0 - 1.2  mg/dL <7.4  Alkaline Phos 39 - 117 IU/L 94  AST 0 - 40 IU/L 26  ALT 0 - 32 IU/L 17    Discharge instruction: per After Visit Summary and "Baby and Me Booklet".  After visit meds:  Allergies as of 11/07/2017      Reactions   Ceclor [cefaclor]    hives   Codeine    Body fights itself eyes dilate   Red Dye    Hives, nausea and migraines   Blackberry Flavor Nausea And Vomiting, Rash   Raspberry Nausea And Vomiting, Rash      Medication List    STOP taking these medications   calcium carbonate 750 MG chewable tablet Commonly known as:  TUMS EX   labetalol 200 MG tablet Commonly known as:  NORMODYNE   ranitidine 150 MG capsule Commonly known as:  ZANTAC     TAKE these medications   docusate sodium 100 MG capsule Commonly known as:  COLACE Take 100 mg by mouth 2 (two) times daily.   ibuprofen 800 MG tablet Commonly known as:  ADVIL,MOTRIN Take 1 tablet (800 mg total) by mouth every 8 (eight) hours as needed.   NIFEdipine 30 MG 24 hr tablet Commonly known as:  PROCARDIA-XL/ADALAT CC Take 1 tablet (30 mg total) by mouth 2 (two) times daily.   prenatal multivitamin Tabs tablet Take 1 tablet by mouth daily at 12 noon.   PROBIOTIC-10 PO Take by mouth.        Diet: routine diet  Activity: Advance as tolerated. Pelvic rest for 6 weeks.   Outpatient follow up:1 week for BP check, 2 weeks for OB visit. Follow up Appt: Future Appointments  Date Time Provider Department Center  11/10/2017 10:30 AM Jillian Collin, MD [facility] None   Follow up Visit:No follow-ups on file.  Postpartum contraception: contraceptive patch  Newborn Data: Live born female  Birth Weight: 4 lb 10.8 oz (2120 g) APGAR: 8, 9  Newborn Delivery   Birth date/time:  11/05/2017 05:45:00 Delivery type:  Vaginal, Spontaneous     Baby Feeding: Breast Disposition:home with mother   11/09/2017 Hildred Laser, MD

## 2017-11-10 ENCOUNTER — Ambulatory Visit (INDEPENDENT_AMBULATORY_CARE_PROVIDER_SITE_OTHER): Payer: Self-pay | Admitting: Obstetrics and Gynecology

## 2017-11-10 ENCOUNTER — Encounter: Payer: Self-pay | Admitting: Obstetrics and Gynecology

## 2017-11-10 VITALS — BP 128/85 | HR 106 | Ht 62.0 in | Wt 176.9 lb

## 2017-11-10 DIAGNOSIS — O115 Pre-existing hypertension with pre-eclampsia, complicating the puerperium: Secondary | ICD-10-CM

## 2017-11-10 DIAGNOSIS — O114 Pre-existing hypertension with pre-eclampsia, complicating childbirth: Principal | ICD-10-CM

## 2017-11-10 DIAGNOSIS — O1092 Unspecified pre-existing hypertension complicating childbirth: Secondary | ICD-10-CM

## 2017-11-10 LAB — MAGNESIUM: Magnesium: 7.1 mg/dL (ref 1.7–2.4)

## 2017-11-10 NOTE — Progress Notes (Signed)
Pt's bp was 128/85. Pt stated that she is doing well concerned about pulse.

## 2017-11-10 NOTE — Progress Notes (Signed)
HPI:      Jillian Anderson is a 32 y.o. [phone] who LMP was No LMP recorded.  Subjective:   She presents today for a blood pressure check because she developed third trimester preeclampsia.  She continues to take Procardia as directed.  She is breast-feeding.  The baby came home from the hospital yesterday and is doing well.  Mom describes normal postpartum bleeding.    Hx: The following portions of the patient's history were reviewed and updated as appropriate:             She  has a past medical history of Allergy, Asthma without status asthmaticus (03/15/2016), Astigmatism, GERD (gastroesophageal reflux disease), History of kidney stones, Migraines, PCOS (polycystic ovarian syndrome), Polycystic disease, ovaries, Tachycardia, and Tachycardia. She does not have any pertinent problems on file. She  has a past surgical history that includes Wisdom tooth extraction and Dilation and evacuation (N/A, 04/23/2016). Her family history includes Asthma in her brother; Diabetes in her father; Hyperlipidemia in her father; Hypertension in her mother. She  reports that she has never smoked. She has never used smokeless tobacco. She reports that she drank about 0.6 oz of alcohol per week. She reports that she does not use drugs. She has a current medication list which includes the following prescription(s): docusate sodium, ibuprofen, nifedipine, prenatal multivitamin, probiotic product, and potassium chloride sa. She is allergic to ceclor [cefaclor]; codeine; red dye; blackberry flavor; and raspberry.       Review of Systems:  Review of Systems  Constitutional: Denied constitutional symptoms, night sweats, recent illness, fatigue, fever, insomnia and weight loss.  Eyes: Denied eye symptoms, eye pain, photophobia, vision change and visual disturbance.  Ears/Nose/Throat/Neck: Denied ear, nose, throat or neck symptoms, hearing loss, nasal discharge, sinus congestion and sore throat.  Cardiovascular:  Denied cardiovascular symptoms, arrhythmia, chest pain/pressure, edema, exercise intolerance, orthopnea and palpitations.  Respiratory: Denied pulmonary symptoms, asthma, pleuritic pain, productive sputum, cough, dyspnea and wheezing.  Gastrointestinal: Denied, gastro-esophageal reflux, melena, nausea and vomiting.  Genitourinary: Denied genitourinary symptoms including symptomatic vaginal discharge, pelvic relaxation issues, and urinary complaints.  Musculoskeletal: Denied musculoskeletal symptoms, stiffness, swelling, muscle weakness and myalgia.  Dermatologic: Denied dermatology symptoms, rash and scar.  Neurologic: Denied neurology symptoms, dizziness, headache, neck pain and syncope.  Psychiatric: Denied psychiatric symptoms, anxiety and depression.  Endocrine: Denied endocrine symptoms including hot flashes and night sweats.   Meds:   Current Outpatient Medications on File Prior to Visit  Medication Sig Dispense Refill  . docusate sodium (COLACE) 100 MG capsule Take 100 mg by mouth 2 (two) times daily.    Marland Kitchen ibuprofen (ADVIL,MOTRIN) 800 MG tablet Take 1 tablet (800 mg total) by mouth every 8 (eight) hours as needed. 60 tablet 1  . NIFEdipine (PROCARDIA-XL/ADALAT CC) 30 MG 24 hr tablet Take 1 tablet (30 mg total) by mouth 2 (two) times daily. 30 tablet 1  . Prenatal Vit-Fe Fumarate-FA (PRENATAL MULTIVITAMIN) TABS tablet Take 1 tablet by mouth daily at 12 noon.    . Probiotic Product (PROBIOTIC-10 PO) Take by mouth.    . [DISCONTINUED] potassium chloride SA (K-DUR,KLOR-CON) 20 MEQ tablet Take 1 tablet (20 mEq total) by mouth daily. X 3 day, repeat labs next week 3 tablet 0   No current facility-administered medications on file prior to visit.     Objective:     Vitals:   11/10/17 0936  BP: 128/85  Pulse: (!) 106  Assessment:    [id number] Patient Active Problem List   Diagnosis Date Noted  . Pe (pre-eclampsia), third trimester 11/04/2017  . IUGR (intrauterine  growth restriction) affecting care of mother 11/03/2017  . PIH (pregnancy induced hypertension) 11/01/2017  . Pre-eclampsia affecting pregnancy, antepartum 11/01/2017  . Pregnancy 10/29/2017  . History of miscarriage, currently pregnant 04/20/2017  . Ear pain 06/04/2016  . Hyperlipidemia 06/04/2016  . Embryonic demise 05/13/2016  . Status post D&C 05/13/2016  . Asthma without status asthmaticus 03/15/2016  . Polycystic ovarian syndrome 03/15/2016  . Obesity (BMI 30.0-34.9) 03/15/2016  . Mild intermittent asthma without complication 03/15/2016  . Folliculitis 09/12/2015  . Dermatitis 09/12/2015  . GERD (gastroesophageal reflux disease) 08/01/2013  . Dysmenorrhea 09/22/2012  . Nephrolithiasis 09/03/2011  . Palpitations 07/08/2011  . IBS (irritable bowel syndrome) 07/08/2011  . Migraines 01/06/2011  . Allergic rhinitis 01/06/2011     1. Pre-eclampsia superimposed on chronic hypertension, delivered     Patient doing well on Procardia.   Plan:            1.  Continue Procardia during postpartum course.  Consider discontinuation at 6-week checkup. Orders No orders of the defined types were placed in this encounter.   No orders of the defined types were placed in this encounter.     F/U  Return in about 1 month (around 12/08/2017).  Elonda Huskyavid J. Alpha Chouinard, M.D. 11/10/2017 9:49 AM

## 2017-12-08 ENCOUNTER — Encounter: Payer: Self-pay | Admitting: Obstetrics and Gynecology

## 2017-12-13 ENCOUNTER — Encounter: Payer: Self-pay | Admitting: Obstetrics and Gynecology

## 2017-12-13 ENCOUNTER — Ambulatory Visit: Payer: Self-pay | Admitting: Obstetrics and Gynecology

## 2017-12-13 DIAGNOSIS — Z8759 Personal history of other complications of pregnancy, childbirth and the puerperium: Secondary | ICD-10-CM

## 2017-12-13 DIAGNOSIS — Z30016 Encounter for initial prescription of transdermal patch hormonal contraceptive device: Secondary | ICD-10-CM

## 2017-12-13 MED ORDER — NORELGESTROMIN-ETH ESTRADIOL 150-35 MCG/24HR TD PTWK: 1.0000 | MEDICATED_PATCH | TRANSDERMAL | 12 refills | Status: DC

## 2017-12-13 NOTE — Progress Notes (Signed)
Pt is present today for PPV. Pt stated that she is doing well and has no complaints. Breastfeeding and formula feeding due to the baby eating a lot and mother is unable to pump enough milk. EPDS=2

## 2017-12-13 NOTE — Progress Notes (Signed)
   OBSTETRICS POSTPARTUM CLINIC PROGRESS NOTE  Subjective:     Jillian Anderson is a 32 y.o. [phone] female who presents for a postpartum visit. She is 6 weeks postpartum following a spontaneous vaginal delivery. I have fully reviewed the prenatal and intrapartum course. The pregnancy was complicated by pre-eclampsia with severe features. The delivery was at 36.6 gestational weeks.  Anesthesia: IV sedation. Postpartum course has been well. Baby's course has been well. Baby is feeding by both breast and bottle - Enfamil Nutramigen. Bleeding: patient has not resumed menses, with No LMP recorded.. Bowel function is normal. Bladder function is normal. Patient is not sexually active. Contraception method desired is [employer] managerXulane patches weekly. Postpartum depression screening: negative (EPDS score is 2).  The following portions of the patient's history were reviewed and updated as appropriate: allergies, current medications, past family history, past medical history, past social history, past surgical history and problem list.  Review of Systems Pertinent items noted in HPI and remainder of comprehensive ROS otherwise negative.   Objective:    BP 111/74   Pulse (!) 108   Ht 5\' 2"  (1.575 m)   Wt 168 lb 8 oz (76.4 kg)   Breastfeeding? Yes   BMI 30.82 kg/m   General:  alert and no distress   Breasts:  inspection negative, no nipple discharge or bleeding, no masses or nodularity palpable  Lungs: clear to auscultation bilaterally  Heart:  regular rate and rhythm, S1, S2 normal, no murmur, click, rub or gallop  Abdomen: soft, non-tender; bowel sounds normal; no masses,  no organomegaly.    Vulva:  normal  Vagina: normal vagina, no discharge, exudate, lesion, or erythema  Cervix:  no cervical motion tenderness and no lesions  Corpus: normal size, contour, position, consistency, mobility, non-tender  Adnexa:  normal adnexa and no mass, fullness, tenderness  Rectal Exam: Not performed.         Labs:    Lab Results  Component Value Date   HGB 10.6 (L) 11/06/2017    Assessment:   Routine postpartum exam.   H/o pre-eclampsia in pregnancy  Plan:   1. Contraception: Xulane patches weekly.  Discussed possibility of decreasing milk supply with use of estrogen-containing contraception. Patient notes understanding. UPT negative today. Advised on Sunday start and recommend back-up method of contraception for at least 2 weeks after initiation of the patch.  2. Discussed possibility of pre-eclampsia in future pregnancies. 3. Follow up in: 4- 6 months or as needed.    Hildred Laserherry, Cooper Moroney, MD [facility]

## 2017-12-16 ENCOUNTER — Other Ambulatory Visit: Payer: Self-pay

## 2017-12-16 MED ORDER — NORGESTIMATE-ETH ESTRADIOL 0.25-35 MG-MCG PO TABS: 1.0000 | ORAL_TABLET | Freq: Every day | ORAL | 11 refills | Status: DC

## 2018-05-16 ENCOUNTER — Encounter: Payer: Self-pay | Admitting: Obstetrics and Gynecology

## 2018-06-07 ENCOUNTER — Encounter: Payer: Self-pay | Admitting: Obstetrics and Gynecology

## 2018-06-09 ENCOUNTER — Encounter: Payer: Self-pay | Admitting: Obstetrics and Gynecology

## 2018-07-11 ENCOUNTER — Ambulatory Visit: Payer: Self-pay | Admitting: Family Medicine

## 2018-07-11 ENCOUNTER — Encounter: Payer: Self-pay | Admitting: Family Medicine

## 2018-07-11 VITALS — BP 118/78 | HR 103 | Temp 98.1°F | Resp 18 | Ht 62.0 in | Wt 172.8 lb

## 2018-07-11 DIAGNOSIS — J019 Acute sinusitis, unspecified: Secondary | ICD-10-CM

## 2018-07-11 MED ORDER — METHYLPREDNISOLONE ACETATE 40 MG/ML IJ SUSP
40.0000 mg | Freq: Once | INTRAMUSCULAR | Status: AC
  Administered 2018-07-11: 40 mg via INTRAMUSCULAR

## 2018-07-11 MED ORDER — AMOXICILLIN-POT CLAVULANATE 875-125 MG PO TABS: 1.0000 | ORAL_TABLET | Freq: Two times a day (BID) | ORAL | 0 refills | Status: DC

## 2018-07-11 NOTE — Patient Instructions (Signed)
Take probiotic daily with antibiotic course  How to Perform a Sinus Rinse A sinus rinse is a home treatment. It rinses your sinuses with a mixture of salt and water (saline solution). Sinuses are air-filled spaces in your skull behind the bones of your face and forehead. They open into your nasal cavity. A sinus rinse can help to clear your nasal cavity. It can clear mucus, dirt, dust, or pollen. You may do a sinus rinse when you have:  A cold.  A virus.  Allergies.  A sinus infection.  A stuffy nose. Talk with your doctor about whether a sinus rinse might help you. What are the risks? A sinus rinse is normally very safe and helpful. However, there are a few risks. These include:  A burning feeling in the sinuses. This may happen if you do not make the saline solution as instructed. Be sure to follow all directions when making the saline solution.  Nasal irritation.  Infection from unclean water. This is rare, but possible. Do not do a sinus rinse if you have had:  Ear or nasal surgery.  An ear infection.  Blocked ears. Supplies needed:  Saline solution or powder.  Distilled or germ-free (sterile) water may be needed to mix with saline powder. ? You may use boiled and cooled tap water. Boil tap water for 5 minutes; cool until it is lukewarm. Use within 24 hours. ? Do not use regular tap water to mix with the saline solution.  Neti pot or nasal rinse bottle. This releases the saline solution into your nose and through your sinuses. You can buy neti pots and rinse bottles: ? At your local pharmacy. ? At a health food store. ? Online. How to perform a sinus rinse  1. Wash your hands with soap and water. 2. Wash your device using the directions that came with it. 3. Dry your device. 4. Use the solution that comes with your device or one that is sold separately in stores. Follow the mixing directions on the package if you need to mix with sterile or distilled  water. 5. Fill your device with the amount of saline solution stated in the device instructions. 6. Stand over a sink and tilt your head sideways over the sink. 7. Place the spout of the device in your upper nostril (the one closer to the ceiling). 8. Gently pour or squeeze the saline solution into your nasal cavity. The liquid should drain to your lower nostril if you are not too stuffed up (congested). 9. While rinsing, breathe through your open mouth. 10. Gently blow your nose to clear any mucus and rinse solution. Blowing too hard may cause ear pain. 11. Repeat in your other nostril. 12. Clean and rinse your device with clean water. 13. Air-dry your device. Talk with your doctor or pharmacist if you have questions about how to do a sinus rinse. Summary  A sinus rinse is a home treatment. It rinses your sinuses with a mixture of salt and water (saline solution).  A sinus rinse is normally very safe and helpful. Follow all instructions carefully.  Talk with your doctor about whether a sinus rinse might help you. This information is not intended to replace advice given to you by your health care provider. Make sure you discuss any questions you have with your health care provider. Document Released: 11/14/2013 Document Revised: 02/14/2017 Document Reviewed: 02/14/2017 Elsevier Interactive Patient Education  2019 [employer] Sinusitis, Adult Sinusitis is soreness and swelling (inflammation) of your  sinuses. Sinuses are hollow spaces in the bones around your face. They are located:  Around your eyes.  In the middle of your forehead.  Behind your nose.  In your cheekbones. Your sinuses and nasal passages are lined with a fluid called mucus. Mucus drains out of your sinuses. Swelling can trap mucus in your sinuses. This lets germs (bacteria, virus, or fungus) grow, which leads to infection. Most of the time, this condition is caused by a virus. What are the causes? This condition is  caused by:  Allergies.  Asthma.  Germs.  Things that block your nose or sinuses.  Growths in the nose (nasal polyps).  Chemicals or irritants in the air.  Fungus (rare). What increases the risk? You are more likely to develop this condition if:  You have a weak body defense system (immune system).  You do a lot of swimming or diving.  You use nasal sprays too much.  You smoke. What are the signs or symptoms? The main symptoms of this condition are pain and a feeling of pressure around the sinuses. Other symptoms include:  Stuffy nose (congestion).  Runny nose (drainage).  Swelling and warmth in the sinuses.  Headache.  Toothache.  A cough that may get worse at night.  Mucus that collects in the throat or the back of the nose (postnasal drip).  Being unable to smell and taste.  Being very tired (fatigue).  A fever.  Sore throat.  Bad breath. How is this diagnosed? This condition is diagnosed based on:  Your symptoms.  Your medical history.  A physical exam.  Tests to find out if your condition is short-term (acute) or long-term (chronic). Your doctor may: ? Check your nose for growths (polyps). ? Check your sinuses using a tool that has a light (endoscope). ? Check for allergies or germs. ? Do imaging tests, such as an MRI or CT scan. How is this treated? Treatment for this condition depends on the cause and whether it is short-term or long-term.  If caused by a virus, your symptoms should go away on their own within 10 days. You may be given medicines to relieve symptoms. They include: ? Medicines that shrink swollen tissue in the nose. ? Medicines that treat allergies (antihistamines). ? A spray that treats swelling of the nostrils. ? Rinses that help get rid of thick mucus in your nose (nasal saline washes).  If caused by bacteria, your doctor may wait to see if you will get better without treatment. You may be given antibiotic medicine if  you have: ? A very bad infection. ? A weak body defense system.  If caused by growths in the nose, you may need to have surgery. Follow these instructions at home: Medicines  Take, use, or apply over-the-counter and prescription medicines only as told by your doctor. These may include nasal sprays.  If you were prescribed an antibiotic medicine, take it as told by your doctor. Do not stop taking the antibiotic even if you start to feel better. Hydrate and humidify   Drink enough water to keep your pee (urine) pale yellow.  Use a cool mist humidifier to keep the humidity level in your home above 50%.  Breathe in steam for 10-15 minutes, 3-4 times a day, or as told by your doctor. You can do this in the bathroom while a hot shower is running.  Try not to spend time in cool or dry air. Rest  Rest as much as you can.  Sleep with your head raised (elevated).  Make sure you get enough sleep each night. General instructions   Put a warm, moist washcloth on your face 3-4 times a day, or as often as told by your doctor. This will help with discomfort.  Wash your hands often with soap and water. If there is no soap and water, use hand sanitizer.  Do not smoke. Avoid being around people who are smoking (secondhand smoke).  Keep all follow-up visits as told by your doctor. This is important. Contact a doctor if:  You have a fever.  Your symptoms get worse.  Your symptoms do not get better within 10 days. Get help right away if:  You have a very bad headache.  You cannot stop throwing up (vomiting).  You have very bad pain or swelling around your face or eyes.  You have trouble seeing.  You feel confused.  Your neck is stiff.  You have trouble breathing. Summary  Sinusitis is swelling of your sinuses. Sinuses are hollow spaces in the bones around your face.  This condition is caused by tissues in your nose that become inflamed or swollen. This traps germs. These can  lead to infection.  If you were prescribed an antibiotic medicine, take it as told by your doctor. Do not stop taking it even if you start to feel better.  Keep all follow-up visits as told by your doctor. This is important. This information is not intended to replace advice given to you by your health care provider. Make sure you discuss any questions you have with your health care provider. Document Released: 10/06/2007 Document Revised: 09/19/2017 Document Reviewed: 09/19/2017 Elsevier Interactive Patient Education  2019 [employer].

## 2018-07-11 NOTE — Progress Notes (Signed)
Subjective:    Patient ID: Jillian Anderson, female    DOB: Jul 24, 1985, 33 y.o.   MRN: [id number]  HPI   Patient presents to clinic complaining of sinus pressure, thick nasal drainage, aching above teeth and across forehead, dry hacking cough and sinus headache for the past week and a half.  Patient has taken over-the-counter Sudafed, Tylenol and using a saline nasal spray with minimal effect in helping symptoms.  Denies fever or chills.  Denies nausea, vomiting or diarrhea.  Denies shortness of breath or wheezing.  Patient Active Problem List   Diagnosis Date Noted  . Pe (pre-eclampsia), third trimester 11/04/2017  . IUGR (intrauterine growth restriction) affecting care of mother 11/03/2017  . PIH (pregnancy induced hypertension) 11/01/2017  . Pre-eclampsia affecting pregnancy, antepartum 11/01/2017  . Pregnancy 10/29/2017  . History of miscarriage, currently pregnant 04/20/2017  . Ear pain 06/04/2016  . Hyperlipidemia 06/04/2016  . Embryonic demise 05/13/2016  . Status post D&C 05/13/2016  . Asthma without status asthmaticus 03/15/2016  . Polycystic ovarian syndrome 03/15/2016  . Obesity (BMI 30.0-34.9) 03/15/2016  . Mild intermittent asthma without complication 03/15/2016  . Folliculitis 09/12/2015  . Dermatitis 09/12/2015  . GERD (gastroesophageal reflux disease) 08/01/2013  . Dysmenorrhea 09/22/2012  . Nephrolithiasis 09/03/2011  . Palpitations 07/08/2011  . IBS (irritable bowel syndrome) 07/08/2011  . Migraines 01/06/2011  . Allergic rhinitis 01/06/2011   Social History   Tobacco Use  . Smoking status: Never Smoker  . Smokeless tobacco: Never Used  Substance Use Topics  . Alcohol use: Not Currently    Alcohol/week: 1.0 standard drinks    Types: 1 Glasses of wine per week    Comment: occassionally - not in pregnancy   Review of Systems  Constitutional: Negative for chills and fever. +fatigue HENT: +congestion, ear pain, sinus pain and sore throat.   Eyes:  Negative.   Respiratory: +cough. Negative for shortness of breath and wheezing.   Cardiovascular: Negative for chest pain, palpitations and leg swelling.  Gastrointestinal: Negative for abdominal pain, diarrhea, nausea and vomiting.  Genitourinary: Negative for dysuria, frequency and urgency.  Musculoskeletal: Negative for arthralgias and myalgias.  Skin: Negative for color change, pallor and rash.  Neurological: Negative for syncope, light-headedness and headaches.  Psychiatric/Behavioral: The patient is not nervous/anxious.       Objective:   Physical Exam Vitals signs and nursing note reviewed.  Constitutional:      General: She is not in acute distress.    Appearance: She is not toxic-appearing or diaphoretic.  HENT:     Head: Normocephalic and atraumatic.     Right Ear: There is no impacted cerumen.     Left Ear: There is no impacted cerumen.     Ears:     Comments: +fullness bilateral TMs    Nose: Nasal tenderness, congestion and rhinorrhea present.     Right Sinus: Maxillary sinus tenderness and frontal sinus tenderness present.     Left Sinus: Maxillary sinus tenderness and frontal sinus tenderness present.     Mouth/Throat:     Mouth: Mucous membranes are moist.     Pharynx: No oropharyngeal exudate or posterior oropharyngeal erythema.     Comments: +post nasal drip and thick discharge Eyes:     General: No scleral icterus.    Extraocular Movements: Extraocular movements intact.     Conjunctiva/sclera: Conjunctivae normal.     Pupils: Pupils are equal, round, and reactive to light.  Neck:     Musculoskeletal: Neck supple. No neck  rigidity.  Cardiovascular:     Rate and Rhythm: Normal rate and regular rhythm.  Pulmonary:     Effort: Pulmonary effort is normal. No respiratory distress.     Breath sounds: No wheezing, rhonchi or rales.  Lymphadenopathy:     Cervical: No cervical adenopathy.  Skin:    General: Skin is warm and dry.     Coloration: Skin is not pale.    Neurological:     Mental Status: She is alert and oriented to person, place, and time.  Psychiatric:        Mood and Affect: Mood normal.        Behavior: Behavior normal.    Vitals:   07/11/18 1016  BP: 118/78  Pulse: (!) 103  Resp: 18  Temp: 98.1 F (36.7 C)  SpO2: 98%      Assessment & Plan:    Acute sinusitis - patient's symptoms and physical exam are suspicious for sinus infection.  She will take Augmentin twice daily for 10 days.  Patient also will get IM methylprednisolone 40 mg x 1 in clinic to help reduce sinus inflammation.  Advised to continue using nasal spray, and also advised to try taking a daily Claritin or Allegra to help dry up congestion.  Rest, increase fluid intake and do good handwashing.  Patient will keep regular scheduled follow-up PCP as planned.  Advised to return to clinic sooner if any issues arise.

## 2018-07-21 ENCOUNTER — Encounter: Payer: Self-pay | Admitting: Obstetrics and Gynecology

## 2018-08-09 ENCOUNTER — Encounter: Payer: Self-pay | Admitting: Obstetrics and Gynecology

## 2018-09-13 ENCOUNTER — Encounter: Payer: Self-pay | Admitting: Obstetrics and Gynecology

## 2018-09-14 ENCOUNTER — Encounter: Payer: Self-pay | Admitting: Obstetrics and Gynecology

## 2018-09-28 ENCOUNTER — Telehealth: Payer: Self-pay | Admitting: Obstetrics and Gynecology

## 2018-09-28 NOTE — Telephone Encounter (Signed)
LM for patient to return call.

## 2018-09-28 NOTE — Telephone Encounter (Signed)
Pt returning missed call for prescreening. Please advise.

## 2018-09-29 ENCOUNTER — Encounter: Payer: Self-pay | Admitting: Obstetrics and Gynecology

## 2018-09-29 ENCOUNTER — Ambulatory Visit: Payer: Self-pay | Admitting: Obstetrics and Gynecology

## 2018-09-29 ENCOUNTER — Other Ambulatory Visit: Payer: Self-pay

## 2018-09-29 VITALS — BP 104/73 | HR 105 | Ht 62.0 in | Wt 177.1 lb

## 2018-09-29 DIAGNOSIS — O09299 Supervision of pregnancy with other poor reproductive or obstetric history, unspecified trimester: Secondary | ICD-10-CM

## 2018-09-29 DIAGNOSIS — N926 Irregular menstruation, unspecified: Secondary | ICD-10-CM

## 2018-09-29 DIAGNOSIS — O09291 Supervision of pregnancy with other poor reproductive or obstetric history, first trimester: Secondary | ICD-10-CM

## 2018-09-29 DIAGNOSIS — Z3201 Encounter for pregnancy test, result positive: Secondary | ICD-10-CM

## 2018-09-29 LAB — POCT URINE PREGNANCY: Preg Test, Ur: POSITIVE — AB

## 2018-09-29 NOTE — Progress Notes (Signed)
PT is present today for confirmation of pregnancy. Pt LMP 08/12/18. UPT done today results were positive.  Pt stated that she is having n/v. No other issues.

## 2018-09-29 NOTE — Patient Instructions (Signed)
First Trimester of Pregnancy  The first trimester of pregnancy is from week 1 until the end of week 13 (months 1 through 3). A week after a sperm fertilizes an egg, the egg will implant on the wall of the uterus. This embryo will begin to develop into a baby. Genes from you and your partner will form the baby. The female genes will determine whether the baby will be a boy or a girl. At 6-8 weeks, the eyes and face will be formed, and the heartbeat can be seen on ultrasound. At the end of 12 weeks, all the baby's organs will be formed.  Now that you are pregnant, you will want to do everything you can to have a healthy baby. Two of the most important things are to get good prenatal care and to follow your health care provider's instructions. Prenatal care is all the medical care you receive before the baby's birth. This care will help prevent, find, and treat any problems during the pregnancy and childbirth.  Body changes during your first trimester  Your body goes through many changes during pregnancy. The changes vary from woman to woman.   You may gain or lose a couple of pounds at first.   You may feel sick to your stomach (nauseous) and you may throw up (vomit). If the vomiting is uncontrollable, call your health care provider.   You may tire easily.   You may develop headaches that can be relieved by medicines. All medicines should be approved by your health care provider.   You may urinate more often. Painful urination may mean you have a bladder infection.   You may develop heartburn as a result of your pregnancy.   You may develop constipation because certain hormones are causing the muscles that push stool through your intestines to slow down.   You may develop hemorrhoids or swollen veins (varicose veins).   Your breasts may begin to grow larger and become tender. Your nipples may stick out more, and the tissue that surrounds them (areola) may become darker.   Your gums may bleed and may be  sensitive to brushing and flossing.   Dark spots or blotches (chloasma, mask of pregnancy) may develop on your face. This will likely fade after the baby is born.   Your menstrual periods will stop.   You may have a loss of appetite.   You may develop cravings for certain kinds of food.   You may have changes in your emotions from day to day, such as being excited to be pregnant or being concerned that something may go wrong with the pregnancy and baby.   You may have more vivid and strange dreams.   You may have changes in your hair. These can include thickening of your hair, rapid growth, and changes in texture. Some women also have hair loss during or after pregnancy, or hair that feels dry or thin. Your hair will most likely return to normal after your baby is born.  What to expect at prenatal visits  During a routine prenatal visit:   You will be weighed to make sure you and the baby are growing normally.   Your blood pressure will be taken.   Your abdomen will be measured to track your baby's growth.   The fetal heartbeat will be listened to between weeks 10 and 14 of your pregnancy.   Test results from any previous visits will be discussed.  Your health care provider may ask you:     How you are feeling.   If you are feeling the baby move.   If you have had any abnormal symptoms, such as leaking fluid, bleeding, severe headaches, or abdominal cramping.   If you are using any tobacco products, including cigarettes, chewing tobacco, and electronic cigarettes.   If you have any questions.  Other tests that may be performed during your first trimester include:   Blood tests to find your blood type and to check for the presence of any previous infections. The tests will also be used to check for low iron levels (anemia) and protein on red blood cells (Rh antibodies). Depending on your risk factors, or if you previously had diabetes during pregnancy, you may have tests to check for high blood sugar  that affects pregnant women (gestational diabetes).   Urine tests to check for infections, diabetes, or protein in the urine.   An ultrasound to confirm the proper growth and development of the baby.   Fetal screens for spinal cord problems (spina bifida) and Down syndrome.   HIV (human immunodeficiency virus) testing. Routine prenatal testing includes screening for HIV, unless you choose not to have this test.   You may need other tests to make sure you and the baby are doing well.  Follow these instructions at home:  Medicines   Follow your health care provider's instructions regarding medicine use. Specific medicines may be either safe or unsafe to take during pregnancy.   Take a prenatal vitamin that contains at least 600 micrograms (mcg) of folic acid.   If you develop constipation, try taking a stool softener if your health care provider approves.  Eating and drinking     Eat a balanced diet that includes fresh fruits and vegetables, whole grains, good sources of protein such as meat, eggs, or tofu, and low-fat dairy. Your health care provider will help you determine the amount of weight gain that is right for you.   Avoid raw meat and uncooked cheese. These carry germs that can cause birth defects in the baby.   Eating four or five small meals rather than three large meals a day may help relieve nausea and vomiting. If you start to feel nauseous, eating a few soda crackers can be helpful. Drinking liquids between meals, instead of during meals, also seems to help ease nausea and vomiting.   Limit foods that are high in fat and processed sugars, such as fried and sweet foods.   To prevent constipation:  ? Eat foods that are high in fiber, such as fresh fruits and vegetables, whole grains, and beans.  ? Drink enough fluid to keep your urine clear or pale yellow.  Activity   Exercise only as directed by your health care provider. Most women can continue their usual exercise routine during  pregnancy. Try to exercise for 30 minutes at least 5 days a week. Exercising will help you:  ? Control your weight.  ? Stay in shape.  ? Be prepared for labor and delivery.   Experiencing pain or cramping in the lower abdomen or lower back is a good sign that you should stop exercising. Check with your health care provider before continuing with normal exercises.   Try to avoid standing for long periods of time. Move your legs often if you must stand in one place for a long time.   Avoid heavy lifting.   Wear low-heeled shoes and practice good posture.   You may continue to have sex unless your health care   provider tells you not to.  Relieving pain and discomfort   Wear a good support bra to relieve breast tenderness.   Take warm sitz baths to soothe any pain or discomfort caused by hemorrhoids. Use hemorrhoid cream if your health care provider approves.   Rest with your legs elevated if you have leg cramps or low back pain.   If you develop varicose veins in your legs, wear support hose. Elevate your feet for 15 minutes, 3-4 times a day. Limit salt in your diet.  Prenatal care   Schedule your prenatal visits by the twelfth week of pregnancy. They are usually scheduled monthly at first, then more often in the last 2 months before delivery.   Write down your questions. Take them to your prenatal visits.   Keep all your prenatal visits as told by your health care provider. This is important.  Safety   Wear your seat belt at all times when driving.   Make a list of emergency phone numbers, including numbers for family, friends, the hospital, and police and fire departments.  General instructions   Ask your health care provider for a referral to a local prenatal education class. Begin classes no later than the beginning of month 6 of your pregnancy.   Ask for help if you have counseling or nutritional needs during pregnancy. Your health care provider can offer advice or refer you to specialists for help  with various needs.   Do not use hot tubs, steam rooms, or saunas.   Do not douche or use tampons or scented sanitary pads.   Do not cross your legs for long periods of time.   Avoid cat litter boxes and soil used by cats. These carry germs that can cause birth defects in the baby and possibly loss of the fetus by miscarriage or stillbirth.   Avoid all smoking, herbs, alcohol, and medicines not prescribed by your health care provider. Chemicals in these products affect the formation and growth of the baby.   Do not use any products that contain nicotine or tobacco, such as cigarettes and e-cigarettes. If you need help quitting, ask your health care provider. You may receive counseling support and other resources to help you quit.   Schedule a dentist appointment. At home, brush your teeth with a soft toothbrush and be gentle when you floss.  Contact a health care provider if:   You have dizziness.   You have mild pelvic cramps, pelvic pressure, or nagging pain in the abdominal area.   You have persistent nausea, vomiting, or diarrhea.   You have a bad smelling vaginal discharge.   You have pain when you urinate.   You notice increased swelling in your face, hands, legs, or ankles.   You are exposed to fifth disease or chickenpox.   You are exposed to German measles (rubella) and have never had it.  Get help right away if:   You have a fever.   You are leaking fluid from your vagina.   You have spotting or bleeding from your vagina.   You have severe abdominal cramping or pain.   You have rapid weight gain or loss.   You vomit blood or material that looks like coffee grounds.   You develop a severe headache.   You have shortness of breath.   You have any kind of trauma, such as from a fall or a car accident.  Summary   The first trimester of pregnancy is from week 1 until   the end of week 13 (months 1 through 3).   Your body goes through many changes during pregnancy. The changes vary from  woman to woman.   You will have routine prenatal visits. During those visits, your health care provider will examine you, discuss any test results you may have, and talk with you about how you are feeling.  This information is not intended to replace advice given to you by your health care provider. Make sure you discuss any questions you have with your health care provider.  Document Released: 04/13/2001 Document Revised: 03/31/2016 Document Reviewed: 03/31/2016  Elsevier Interactive Patient Education  2019 [employer]

## 2018-09-29 NOTE — Progress Notes (Signed)
    GYNECOLOGY CLINIC PROGRESS NOTE Subjective:    Jillian Anderson is a 33 y.o. female who presents for evaluation of amenorrhea. She believes she could be pregnant. Pregnancy is desired. Sexual Activity: single partner, contraception: OCP (estrogen/progesterone) but was on antibiotics which caused her cycle to be irregular so she stopped x 1 month in April. Current symptoms also include: heartburn, breast tenderness, fatigue, nausea and positive home pregnancy test. Last period was normal.   Patient's last menstrual period was 08/12/2018.    The following portions of the patient's history were reviewed and updated as appropriate: allergies, current medications, past family history, past medical history, past social history, past surgical history and problem list.    Review of Systems Pertinent items noted in HPI and remainder of comprehensive ROS otherwise negative.     Objective:    BP 104/73   Pulse (!) 105   Ht 5\' 2"  (1.575 m)   Wt 177 lb 1.6 oz (80.3 kg)   LMP 08/12/2018   BMI 32.39 kg/m  General: alert and no acute distress    Lab Review Urine HCG: positive    Assessment:    Absence of menstruation.    Nausea (mild) History of pre-eclampsia History of miscarriage  Plan:    - Pregnancy Test: Positive: EDC: 05/19/2019, with EGA 6.6 weeks. Briefly discussed pre-natal care options. Education on first trimester of pregnancy  Encouraged well-balanced diet, plenty of rest when needed, pre-natal vitamins daily and walking for exercise. Discussed self-help for nausea, avoiding OTC medications until consulting provider or pharmacist, other than Tylenol as needed, minimal caffeine (1-2 cups daily) and avoiding alcohol. She will schedule her initial OB visit in the next month with her PCP or OB provider. Feel free to call with any questions. - Will need to begin low-dose aspirin after first trimester.    Hildred Laser, MD [facility]

## 2018-10-09 ENCOUNTER — Telehealth: Payer: Self-pay

## 2018-10-09 NOTE — Telephone Encounter (Signed)
Coronavirus (COVID-19) Are you at risk?  Are you at risk for the Coronavirus (COVID-19)?  To be considered HIGH RISK for Coronavirus (COVID-19), you have to meet the following criteria:  . Traveled to [place], [place], [place], [place] or [place]; or in the [place] to [place], [place], [place], or [place]; and have fever, cough, and shortness of breath within the last 2 weeks of travel OR . Been in close contact with a person diagnosed with COVID-19 within the last 2 weeks and have fever, cough, and shortness of breath . IF YOU DO NOT MEET THESE CRITERIA, YOU ARE CONSIDERED LOW RISK FOR COVID-19.  What to do if you are HIGH RISK for COVID-19?  . If you are having a medical emergency, call 911. . Seek medical care right away. Before you go to a doctor's office, urgent care or emergency department, call ahead and tell them about your recent travel, contact with someone diagnosed with COVID-19, and your symptoms. You should receive instructions from your physician's office regarding next steps of care.  . When you arrive at healthcare provider, tell the healthcare staff immediately you have returned from visiting [place], [place], [place], [place] or [place]; or traveled in the [place] to [place], [place], [place], or [place]; in the last two weeks or you have been in close contact with a person diagnosed with COVID-19 in the last 2 weeks.   . Tell the health care staff about your symptoms: fever, cough and shortness of breath. . After you have been seen by a medical provider, you will be either: o Tested for (COVID-19) and discharged home on quarantine except to seek medical care if symptoms worsen, and asked to  - Stay home and avoid contact with others until you get your results (4-5 days)  - Avoid travel on public transportation if possible (such as bus, train, or airplane) or o Sent to the Emergency Department by EMS for evaluation, COVID-19 testing, and possible  admission depending on your condition and test results.  What to do if you are LOW RISK for COVID-19?  Reduce your risk of any infection by using the same precautions used for avoiding the common cold or flu:  . Wash your hands often with soap and warm water for at least 20 seconds.  If soap and water are not readily available, use an alcohol-based hand sanitizer with at least 60% alcohol.  . If coughing or sneezing, cover your mouth and nose by coughing or sneezing into the elbow areas of your shirt or coat, into a tissue or into your sleeve (not your hands). . Avoid shaking hands with others and consider head nods or verbal greetings only. . Avoid touching your eyes, nose, or mouth with unwashed hands.  . Avoid close contact with people who are sick. . Avoid places or events with large numbers of people in one location, like concerts or sporting events. . Carefully consider travel plans you have or are making. . If you are planning any travel outside or inside the [place], visit the CDC's Travelers' Health webpage for the latest health notices. . If you have some symptoms but not all symptoms, continue to monitor at home and seek medical attention if your symptoms worsen. . If you are having a medical emergency, call 911.   ADDITIONAL HEALTHCARE OPTIONS FOR PATIENTS  [facility] / e-Visit: [email or web address]         [facility]: [phone]  [facility]   Urgent Care: [facility]: [phone]   Prescreened. Neg, .cm

## 2018-10-10 ENCOUNTER — Ambulatory Visit (INDEPENDENT_AMBULATORY_CARE_PROVIDER_SITE_OTHER): Payer: Self-pay | Admitting: Obstetrics and Gynecology

## 2018-10-10 ENCOUNTER — Other Ambulatory Visit: Payer: Self-pay

## 2018-10-10 ENCOUNTER — Ambulatory Visit (INDEPENDENT_AMBULATORY_CARE_PROVIDER_SITE_OTHER): Payer: Self-pay

## 2018-10-10 ENCOUNTER — Other Ambulatory Visit: Payer: Self-pay | Admitting: Obstetrics and Gynecology

## 2018-10-10 VITALS — BP 124/82 | HR 99 | Ht 62.0 in | Wt 180.2 lb

## 2018-10-10 DIAGNOSIS — Z3201 Encounter for pregnancy test, result positive: Secondary | ICD-10-CM

## 2018-10-10 DIAGNOSIS — O09291 Supervision of pregnancy with other poor reproductive or obstetric history, first trimester: Secondary | ICD-10-CM

## 2018-10-10 DIAGNOSIS — O99210 Obesity complicating pregnancy, unspecified trimester: Secondary | ICD-10-CM | POA: Insufficient documentation

## 2018-10-10 DIAGNOSIS — O99211 Obesity complicating pregnancy, first trimester: Secondary | ICD-10-CM

## 2018-10-10 DIAGNOSIS — O3481 Maternal care for other abnormalities of pelvic organs, first trimester: Secondary | ICD-10-CM

## 2018-10-10 DIAGNOSIS — Z3A01 Less than 8 weeks gestation of pregnancy: Secondary | ICD-10-CM

## 2018-10-10 DIAGNOSIS — Z8759 Personal history of other complications of pregnancy, childbirth and the puerperium: Secondary | ICD-10-CM

## 2018-10-10 DIAGNOSIS — O10919 Unspecified pre-existing hypertension complicating pregnancy, unspecified trimester: Secondary | ICD-10-CM | POA: Insufficient documentation

## 2018-10-10 DIAGNOSIS — N8311 Corpus luteum cyst of right ovary: Secondary | ICD-10-CM

## 2018-10-10 DIAGNOSIS — O09299 Supervision of pregnancy with other poor reproductive or obstetric history, unspecified trimester: Secondary | ICD-10-CM | POA: Insufficient documentation

## 2018-10-10 DIAGNOSIS — O10911 Unspecified pre-existing hypertension complicating pregnancy, first trimester: Secondary | ICD-10-CM

## 2018-10-10 NOTE — Patient Instructions (Signed)
First Trimester of Pregnancy  The first trimester of pregnancy is from week 1 until the end of week 13 (months 1 through 3). During this time, your baby will begin to develop inside you. At 6-8 weeks, the eyes and face are formed, and the heartbeat can be seen on ultrasound. At the end of 12 weeks, all the baby's organs are formed. Prenatal care is all the medical care you receive before the birth of your baby. Make sure you get good prenatal care and follow all of your doctor's instructions. Follow these instructions at home: Medicines  Take over-the-counter and prescription medicines only as told by your doctor. Some medicines are safe and some medicines are not safe during pregnancy.  Take a prenatal vitamin that contains at least 600 micrograms (mcg) of folic acid.  If you have trouble pooping (constipation), take medicine that will make your stool soft (stool softener) if your doctor approves. Eating and drinking   Eat regular, healthy meals.  Your doctor will tell you the amount of weight gain that is right for you.  Avoid raw meat and uncooked cheese.  If you feel sick to your stomach (nauseous) or throw up (vomit): ? Eat 4 or 5 small meals a day instead of 3 large meals. ? Try eating a few soda crackers. ? Drink liquids between meals instead of during meals.  To prevent constipation: ? Eat foods that are high in fiber, like fresh fruits and vegetables, whole grains, and beans. ? Drink enough fluids to keep your pee (urine) clear or pale yellow. Activity  Exercise only as told by your doctor. Stop exercising if you have cramps or pain in your lower belly (abdomen) or low back.  Do not exercise if it is too hot, too humid, or if you are in a place of great height (high altitude).  Try to avoid standing for long periods of time. Move your legs often if you must stand in one place for a long time.  Avoid heavy lifting.  Wear low-heeled shoes. Sit and stand up straight.   You can have sex unless your doctor tells you not to. Relieving pain and discomfort  Wear a good support bra if your breasts are sore.  Take warm water baths (sitz baths) to soothe pain or discomfort caused by hemorrhoids. Use hemorrhoid cream if your doctor says it is okay.  Rest with your legs raised if you have leg cramps or low back pain.  If you have puffy, bulging veins (varicose veins) in your legs: ? Wear support hose or compression stockings as told by your doctor. ? Raise (elevate) your feet for 15 minutes, 3-4 times a day. ? Limit salt in your food. Prenatal care  Schedule your prenatal visits by the twelfth week of pregnancy.  Write down your questions. Take them to your prenatal visits.  Keep all your prenatal visits as told by your doctor. This is important. Safety  Wear your seat belt at all times when driving.  Make a list of emergency phone numbers. The list should include numbers for family, friends, the hospital, and police and fire departments. General instructions  Ask your doctor for a referral to a local prenatal class. Begin classes no later than at the start of month 6 of your pregnancy.  Ask for help if you need counseling or if you need help with nutrition. Your doctor can give you advice or tell you where to go for help.  Do not use hot tubs, steam   rooms, or saunas.  Do not douche or use tampons or scented sanitary pads.  Do not cross your legs for long periods of time.  Avoid all herbs and alcohol. Avoid drugs that are not approved by your doctor.  Do not use any tobacco products, including cigarettes, chewing tobacco, and electronic cigarettes. If you need help quitting, ask your doctor. You may get counseling or other support to help you quit.  Avoid cat litter boxes and soil used by cats. These carry germs that can cause birth defects in the baby and can cause a loss of your baby (miscarriage) or stillbirth.  Visit your dentist. At home,  brush your teeth with a soft toothbrush. Be gentle when you floss. Contact a doctor if:  You are dizzy.  You have mild cramps or pressure in your lower belly.  You have a nagging pain in your belly area.  You continue to feel sick to your stomach, you throw up, or you have watery poop (diarrhea).  You have a bad smelling fluid coming from your vagina.  You have pain when you pee (urinate).  You have increased puffiness (swelling) in your face, hands, legs, or ankles. Get help right away if:  You have a fever.  You are leaking fluid from your vagina.  You have spotting or bleeding from your vagina.  You have very bad belly cramping or pain.  You gain or lose weight rapidly.  You throw up blood. It may look like coffee grounds.  You are around people who have German measles, fifth disease, or chickenpox.  You have a very bad headache.  You have shortness of breath.  You have any kind of trauma, such as from a fall or a car accident. Summary  The first trimester of pregnancy is from week 1 until the end of week 13 (months 1 through 3).  To take care of yourself and your unborn baby, you will need to eat healthy meals, take medicines only if your doctor tells you to do so, and do activities that are safe for you and your baby.  Keep all follow-up visits as told by your doctor. This is important as your doctor will have to ensure that your baby is healthy and growing well. This information is not intended to replace advice given to you by your health care provider. Make sure you discuss any questions you have with your health care provider. Document Released: 10/06/2007 Document Revised: 04/27/2016 Document Reviewed: 04/27/2016 Elsevier Interactive Patient Education  2019 [employer] Inc.  

## 2018-10-10 NOTE — Progress Notes (Signed)
Selinda Flavin presents for NOB nurse interview visit. Pregnancy confirmation done here at [facility].  G-3 .  P- 1   . Pregnancy education material explained and given. _No__ cats in the home. NOB labs ordered. (TSH/HbgA1c due to Increased BMI), . HIV labs and Drug screen were explained optional and she did not decline. Drug screen ordere. PNV encouraged. Genetic screening options discussed. Genetic testing:/Unsure.  Pt may discuss with provider. Pt. To follow up with provider in _3_ weeks for NOB physical.  All questions answered, FMLA paperwork signed, financial policy discussed, pt voiced understanding

## 2018-10-10 NOTE — Progress Notes (Signed)
I have reviewed the record and concur with patient management and plan.  Patient also with a history of chronic HTN and superimposed pre-eclampsia in previous pregnancy. Will also get baseline PIH labs.    [name], MD [facility]

## 2018-10-11 LAB — HEMOGLOBIN A1C
Est. average glucose Bld gHb Est-mCnc: 97 mg/dL
Hgb A1c MFr Bld: 5 % (ref 4.8–5.6)

## 2018-10-11 LAB — ABO AND RH: Rh Factor: POSITIVE

## 2018-10-11 LAB — HGB SOLU + RFLX FRAC: Sickle Solubility Test - HGBRFX: NEGATIVE

## 2018-10-11 LAB — RPR: RPR Ser Ql: NONREACTIVE

## 2018-10-11 LAB — VARICELLA ZOSTER ANTIBODY, IGG: Varicella zoster IgG: 731 index (ref >165)

## 2018-10-11 LAB — ANTIBODY SCREEN: Antibody Screen: NEGATIVE

## 2018-10-11 LAB — HIV ANTIBODY (ROUTINE TESTING W REFLEX): HIV Screen 4th Generation wRfx: NONREACTIVE

## 2018-10-11 LAB — HEPATITIS B SURFACE ANTIGEN: Hepatitis B Surface Ag: NEGATIVE

## 2018-10-11 LAB — RUBELLA SCREEN: Rubella Antibodies, IGG: 4.75 index (ref >0.99)

## 2018-10-11 LAB — TSH: TSH: 1.15 u[IU]/mL (ref 0.450–4.500)

## 2018-10-24 ENCOUNTER — Ambulatory Visit (INDEPENDENT_AMBULATORY_CARE_PROVIDER_SITE_OTHER): Payer: Medicaid Other | Admitting: Obstetrics and Gynecology

## 2018-10-24 ENCOUNTER — Telehealth: Payer: Self-pay | Admitting: Obstetrics and Gynecology

## 2018-10-24 ENCOUNTER — Other Ambulatory Visit: Payer: Self-pay

## 2018-10-24 ENCOUNTER — Encounter: Payer: Self-pay | Admitting: Obstetrics and Gynecology

## 2018-10-24 ENCOUNTER — Other Ambulatory Visit (INDEPENDENT_AMBULATORY_CARE_PROVIDER_SITE_OTHER): Payer: Self-pay

## 2018-10-24 DIAGNOSIS — O200 Threatened abortion: Secondary | ICD-10-CM

## 2018-10-24 DIAGNOSIS — O4690 Antepartum hemorrhage, unspecified, unspecified trimester: Secondary | ICD-10-CM

## 2018-10-24 DIAGNOSIS — Z3A08 8 weeks gestation of pregnancy: Secondary | ICD-10-CM

## 2018-10-24 LAB — POCT URINALYSIS DIPSTICK OB
Bilirubin, UA: NEGATIVE
Glucose, UA: NEGATIVE
Ketones, UA: NEGATIVE
Leukocytes, UA: NEGATIVE
Nitrite, UA: NEGATIVE
POC,PROTEIN,UA: NEGATIVE
Spec Grav, UA: 1.025 (ref 1.010–1.025)
Urobilinogen, UA: 0.2 E.U./dL
pH, UA: 6.5 (ref 5.0–8.0)

## 2018-10-24 NOTE — Progress Notes (Signed)
    GYNECOLOGY PROGRESS NOTE  Subjective:    Patient ID: Jillian Anderson, female    DOB: [birth year], 33 y.o.   MRN: [id number]  HPI  Patient is a 33 y.o. [name] female who presents for complaints of bright red vaginal bleeding x 1 day. She is currently 9.[redacted] weeks gestation by LMP, consistent with 7 week ultrasound, with EDD 05/22/2018. Patient reports that she started having moderate bleeding around 11 a.m. this morning.  Notes that after this she began noting more so spotting with wiping after voiding.  She does have a history of kidney stones in the past. Denies flank pain or dysuria currently. Denies recent coitus.   The following portions of the patient's history were reviewed and updated as appropriate: allergies, current medications, past family history, past medical history, past social history, past surgical history and problem list.  Review of Systems Pertinent items noted in HPI and remainder of comprehensive ROS otherwise negative.   Objective:   Last menstrual period 08/16/2018, currently breastfeeding. VSS General appearance: alert and no distress Abdomen: soft, non-tender; bowel sounds normal; no masses,  no organomegaly Pelvic: deferred.    Labs:  Lab Results  Component Value Date   ABORH Jillian Anderson POS 11/04/2017    Imaging:  Patient Name: Jillian Anderson DOB: [birth year] MRN: [id number] ULTRASOUND REPORT  Location: [facility]ian [facility] Date of Service: 10/24/2018   Indications:Threatened AB Findings:  Jillian Anderson intrauterine pregnancy is visualized with a CRL consistent with [redacted]w[redacted]d gestation, giving an (U/S) EDD of 05/31/2018.   FHR: 165 BPM CRL measurement: 25 mm Yolk sac is visualized and appears normal and early anatomy is normal. Amnion: visualized and appears normal   Right Ovary is normal in appearance. Left Ovary is normal appearance. Corpus luteal cyst:  is not visualized Survey of the adnexa demonstrates no adnexal masses. There is no free  peritoneal fluid in the cul de sac.  Impression: 1. [redacted]w[redacted]d Viable Singleton Intrauterine pregnancy by U/S. 2. (U/S) EDD is 05/31/2018.  Recommendations: 1.Clinical correlation with the patient's History and Physical Exam.  Jillian Anderson     RDMS   Assessment:   1. Threatened abortion  Plan:   1. Patient with threatened abortion. Viable IUP visualized on ultrasound today. Discussed bleeding precautions. Advised on pelvic rest, limiting heavy lifting until out of first trimester.  2. Blood type O Pos, does not need Rhogam.  3. UA ordered to rule out UTI/asymptomatic bacteruria.    Jillian Maid, MD Jillian Anderson

## 2018-10-24 NOTE — Telephone Encounter (Signed)
Spoke with pt to see if she could come in at 3:45pm to see [facility] due to her OB bleeding.

## 2018-10-24 NOTE — Telephone Encounter (Signed)
The patient called and stated that she is an Early ob and she went to the restroom and she found out she was bleeding the patient stated that the blood is bright red and is heavier than spotting. Pt is requesting a call back as soon as possible. No cramping yet. Please advise.

## 2018-10-24 NOTE — Progress Notes (Signed)
Pt is present today due to early OB bleeding. Pt stated that she noticed bright red blood.

## 2018-10-24 NOTE — Patient Instructions (Signed)
Threatened Miscarriage  A threatened miscarriage occurs when a woman has vaginal bleeding during the first 20 weeks of pregnancy but the pregnancy has not ended. If you have vaginal bleeding during this time, your health care provider will do tests to make sure you are still pregnant. If the tests show that you are still pregnant and that the developing baby (fetus) inside your uterus is still growing, your condition is considered a threatened miscarriage. A threatened miscarriage does not mean your pregnancy will end, but it does increase the risk of losing your pregnancy (complete miscarriage). What are the causes? The cause of this condition is usually not known. For women who go on to have a complete miscarriage, the most common cause is an abnormal number of chromosomes in the developing baby. Chromosomes are the structures inside cells that hold all of a person's genetic material. What increases the risk? The following lifestyle factors may increase your risk of a miscarriage in early pregnancy:  Smoking.  Drinking excessive amounts of alcohol or caffeine.  Recreational drug use. The following preexisting health conditions may increase your risk of a miscarriage in early pregnancy:  Polycystic ovary syndrome.  Uterine fibroids.  Infections.  Diabetes mellitus. What are the signs or symptoms? Symptoms of this condition include:  Vaginal bleeding.  Mild abdominal pain or cramps. How is this diagnosed? If you have bleeding with or without abdominal pain before 20 weeks of pregnancy, your health care provider will do tests to check whether you are still pregnant. These will include:  Ultrasound. This test uses sound waves to create images of the inside of your uterus. This allows your health care provider to look at your developing baby and other structures, such as your placenta.  Pelvic exam. This is an internal exam of your vagina and cervix.  Measurement of your baby's heart  rate.  Laboratory tests such as blood tests, urine tests, or swabs for infection You may be diagnosed with a threatened miscarriage if:  Ultrasound testing shows that you are still pregnant.  Your baby's heart rate is strong.  A pelvic exam shows that the opening between your uterus and your vagina (cervix) is closed.  Blood tests confirm that you are still pregnant. How is this treated? No treatments have been shown to prevent a threatened miscarriage from going on to a complete miscarriage. However, the right home care is important. Follow these instructions at home:  Get plenty of rest.  Do not have sex or use tampons if you have vaginal bleeding.  Do not douche.  Do not smoke or use recreational drugs.  Do not drink alcohol.  Avoid caffeine.  Keep all follow-up prenatal visits as told by your health care provider. This is important. Contact a health care provider if:  You have light vaginal bleeding or spotting while pregnant.  You have abdominal pain or cramping.  You have a fever. Get help right away if:  You have heavy vaginal bleeding.  You have blood clots coming from your vagina.  You pass tissue from your vagina.  You leak fluid, or you have a gush of fluid from your vagina.  You have severe low back pain or abdominal cramps.  You have fever, chills, and severe abdominal pain. Summary  A threatened miscarriage occurs when a woman has vaginal bleeding during the first 20 weeks of pregnancy but the pregnancy has not ended.  The cause of a threatened miscarriage is usually not known.  Symptoms of this condition may   include vaginal bleeding and mild abdominal pain or cramps.  No treatments have been shown to prevent a threatened miscarriage from going on to a complete miscarriage.  Keep all follow-up prenatal visits as told by your health care provider. This is important. This information is not intended to replace advice given to you by your health  care provider. Make sure you discuss any questions you have with your health care provider. Document Released: 04/19/2005 Document Revised: 07/16/2016 Document Reviewed: 07/16/2016 Elsevier Interactive Patient Education  2019 [employer]vier Inc.  

## 2018-10-25 ENCOUNTER — Other Ambulatory Visit: Payer: Medicaid Other

## 2018-10-25 NOTE — Telephone Encounter (Signed)
Pt was seen in the office yesterday.

## 2018-11-02 ENCOUNTER — Encounter: Payer: Medicaid Other | Admitting: Obstetrics and Gynecology

## 2018-11-10 ENCOUNTER — Other Ambulatory Visit: Payer: Self-pay

## 2018-11-10 ENCOUNTER — Ambulatory Visit (INDEPENDENT_AMBULATORY_CARE_PROVIDER_SITE_OTHER): Payer: Self-pay | Admitting: Obstetrics and Gynecology

## 2018-11-10 VITALS — BP 122/70 | HR 82 | Ht 62.0 in | Wt 177.2 lb

## 2018-11-10 DIAGNOSIS — O09291 Supervision of pregnancy with other poor reproductive or obstetric history, first trimester: Secondary | ICD-10-CM

## 2018-11-10 DIAGNOSIS — Z8751 Personal history of pre-term labor: Secondary | ICD-10-CM

## 2018-11-10 DIAGNOSIS — Z1379 Encounter for other screening for genetic and chromosomal anomalies: Secondary | ICD-10-CM

## 2018-11-10 DIAGNOSIS — Z3401 Encounter for supervision of normal first pregnancy, first trimester: Secondary | ICD-10-CM

## 2018-11-10 DIAGNOSIS — O09899 Supervision of other high risk pregnancies, unspecified trimester: Secondary | ICD-10-CM

## 2018-11-10 DIAGNOSIS — O09891 Supervision of other high risk pregnancies, first trimester: Secondary | ICD-10-CM

## 2018-11-10 DIAGNOSIS — Z3A21 21 weeks gestation of pregnancy: Secondary | ICD-10-CM

## 2018-11-10 DIAGNOSIS — O09211 Supervision of pregnancy with history of pre-term labor, first trimester: Secondary | ICD-10-CM

## 2018-11-10 DIAGNOSIS — O09299 Supervision of pregnancy with other poor reproductive or obstetric history, unspecified trimester: Secondary | ICD-10-CM

## 2018-11-10 LAB — POCT URINALYSIS DIPSTICK OB
Bilirubin, UA: NEGATIVE
Glucose, UA: NEGATIVE
Ketones, UA: NEGATIVE
Leukocytes, UA: NEGATIVE
Nitrite, UA: NEGATIVE
Spec Grav, UA: 1.01 (ref 1.010–1.025)
Urobilinogen, UA: 0.2 E.U./dL
pH, UA: 7 (ref 5.0–8.0)

## 2018-11-10 MED ORDER — ASPIRIN EC 81 MG PO TBEC: 81.0000 mg | DELAYED_RELEASE_TABLET | Freq: Every day | ORAL | 2 refills | Status: DC

## 2018-11-10 NOTE — Patient Instructions (Signed)

## 2018-11-10 NOTE — Progress Notes (Signed)
OBSTETRIC INITIAL PRENATAL VISIT  Subjective:    Jillian Anderson is being seen today for her first obstetrical visit.  This is a planned pregnancy. She is a 33 y.o. [id number] female at [redacted]w[redacted]d gestation, Estimated Date of Delivery: 05/23/19 with Patient's last menstrual period was 08/16/2018. consistent with 7 week sono. Her obstetrical history is significant for intrauterine growth restriction (IUGR) and pre-eclampsia with preterm delivery at 36 weeks. Relationship with FOB: spouse, living together. Patient does intend to breast feed. Pregnancy history fully reviewed.   OB History  Gravida Para Term Preterm AB Living  3 1 0 1 1 1   SAB TAB Ectopic Multiple Live Births  1 0 0 0 1    # Outcome Date GA Lbr Len/2nd Weight Sex Delivery Anes PTL Lv  3 Current           2 Preterm 11/05/17 [redacted]w[redacted]d / 00:07 4 lb 10.8 oz (2.12 kg) M Vag-Spont Local  LIV     Name: Staub,BOY Lyllie     Apgar1: 8  Apgar5: 9  1 SAB             Gynecologic History:  Last pap smear was 05/26/2017.  Results were normal.  Denies h/o abnormal pap smears in the past.  Denies history of STIs.  Contraception: None   Past Medical History:  Diagnosis Date  . Allergy   . Asthma without status asthmaticus 03/15/2016  . Astigmatism   . GERD (gastroesophageal reflux disease)   . History of kidney stones    In the past  . IUGR (intrauterine growth restriction) affecting care of mother 11/03/2017  . Migraines   . PCOS (polycystic ovarian syndrome)   . Polycystic disease, ovaries   . Pre-eclampsia affecting pregnancy, antepartum 11/01/2017  . Tachycardia     Family History  Problem Relation Age of Onset  . Hypertension Mother   . Hyperlipidemia Father   . Diabetes Father   . Asthma Brother   . Breast cancer Neg Hx   . Ovarian cancer Neg Hx   . Colon cancer Neg Hx     Past Surgical History:  Procedure Laterality Date  . DILATION AND EVACUATION N/A 04/23/2016   Procedure: DILATATION AND EVACUATION;  Surgeon:  Brayton Mars, MD;  Location: [facility] ORS;  Service: Gynecology;  Laterality: N/A;  . WISDOM TOOTH EXTRACTION      Social History   Socioeconomic History  . Marital status: Married    Spouse name: Not on file  . Number of children: Not on file  . Years of education: Not on file  . Highest education level: Not on file  Occupational History  . Not on file  Social Needs  . Financial resource strain: Not on file  . Food insecurity    Worry: Not on file    Inability: Not on file  . Transportation needs    Medical: Not on file    Non-medical: Not on file  Tobacco Use  . Smoking status: Never Smoker  . Smokeless tobacco: Never Used  Substance and Sexual Activity  . Alcohol use: Not Currently    Alcohol/week: 1.0 standard drinks    Types: 1 Glasses of wine per week    Comment: occassionally - not in pregnancy  . Drug use: No  . Sexual activity: Yes    Birth control/protection: None  Lifestyle  . Physical activity    Days per week: Not on file    Minutes per session: Not on file  .  Stress: Not on file  Relationships  . Social Musicianconnections    Talks on phone: Not on file    Gets together: Not on file    Attends religious service: Not on file    Active member of club or organization: Not on file    Attends meetings of clubs or organizations: Not on file    Relationship status: Not on file  . Intimate partner violence    Fear of current or ex partner: Not on file    Emotionally abused: Not on file    Physically abused: Not on file    Forced sexual activity: Not on file  Other Topics Concern  . Not on file  Social History Narrative  . Not on file    Current Outpatient Medications on File Prior to Visit  Medication Sig Dispense Refill  . docusate sodium (COLACE) 100 MG capsule Take 100 mg by mouth 2 (two) times daily.    Marland Kitchen. levocetirizine (XYZAL) 5 MG tablet Take 5 mg by mouth every evening.    . Prenatal Vit-Fe Fumarate-FA (PRENATAL MULTIVITAMIN) TABS tablet Take 1  tablet by mouth daily at 12 noon.    . Probiotic Product (PROBIOTIC-10 PO) Take by mouth.    . [DISCONTINUED] potassium chloride SA (K-DUR,KLOR-CON) 20 MEQ tablet Take 1 tablet (20 mEq total) by mouth daily. X 3 day, repeat labs next week 3 tablet 0   No current facility-administered medications on file prior to visit.     Allergies  Allergen Reactions  . Ceclor [Cefaclor]     hives  . Codeine     Body fights itself eyes dilate  . Red Dye     Hives, nausea and migraines   . Blackberry Flavor Nausea And Vomiting and Rash  . Raspberry Nausea And Vomiting and Rash     Review of Systems General: Not Present- Fever, Weight Loss and Weight Gain. Skin: Not Present- Rash. HEENT: Not Present- Blurred Vision, Headache and Bleeding Gums. Respiratory: Not Present- Difficulty Breathing. Breast: Not Present- Breast Mass. Cardiovascular: Not Present- Chest Pain, Elevated Blood Pressure, Fainting / Blacking Out and Shortness of Breath. Gastrointestinal: Not Present- Abdominal Pain, Constipation, and Vomiting. Present- Nausea (controlled with meds).  Female Genitourinary: Not Present- Frequency, Painful Urination, Pelvic Pain,  Vaginal Discharge, Contractions, regular, Fetal Movements Decreased, Urinary Complaints and Vaginal Fluid. Present- Vaginal Spotting (mostly when wiping), Musculoskeletal: Not Present- Back Pain and Leg Cramps. Neurological: Not Present- Dizziness. Psychiatric: Not Present- Depression.     Objective:   Blood pressure 122/70, pulse 82, height 5\' 2"  (1.575 m), weight 177 lb 3.2 oz (80.4 kg), last menstrual period 08/16/2018, currently breastfeeding.  Body mass index is 32.41 kg/m.  General Appearance:    Alert, cooperative, no distress, appears stated age, mild obesity  Head:    Normocephalic, without obvious abnormality, atraumatic  Eyes:    PERRL, conjunctiva/corneas clear, EOM's intact, both eyes  Ears:    Normal external ear canals, both ears  Nose:   Nares  normal, septum midline, mucosa normal, no drainage or sinus tenderness  Throat:   Lips, mucosa, and tongue normal; teeth and gums normal  Neck:   Supple, symmetrical, trachea midline, no adenopathy; thyroid: no enlargement/tenderness/nodules; no carotid bruit or JVD  Back:     Symmetric, no curvature, ROM normal, no CVA tenderness  Lungs:     Clear to auscultation bilaterally, respirations unlabored  Chest Wall:    No tenderness or deformity   Heart:    Regular rate and  rhythm, S1 and S2 normal, no murmur, rub or gallop  Breast Exam:    No tenderness, masses, or nipple abnormality  Abdomen:     Soft, non-tender, bowel sounds active all four quadrants, no masses, no organomegaly.  FHT 164 bpm.  Genitalia:    Pelvic:external genitalia normal, vagina without lesions, discharge, or tenderness, rectovaginal septum  normal. Cervix normal in appearance, no cervical motion tenderness, no adnexal masses or tenderness.  Pregnancy positive findings: uterine enlargement: 12 wk size, nontender.   Rectal:    Normal external sphincter.  No hemorrhoids appreciated. Internal exam not done.   Extremities:   Extremities normal, atraumatic, no cyanosis or edema  Pulses:   2+ and symmetric all extremities  Skin:   Skin color, texture, turgor normal, no rashes or lesions  Lymph nodes:   Cervical, supraclavicular, and axillary nodes normal  Neurologic:   CNII-XII intact, normal strength, sensation and reflexes throughout      Assessment:    Pregnancy at 12 and 2/7 weeks    Plan:    Initial labs reviewed.  Will order baseline CMP due to history of pre-eclampsia.  Prenatal vitamins encouraged. Problem list reviewed and updated. New OB counseling:  The patient has been given an overview regarding routine prenatal care.  Recommendations regarding diet, weight gain, and exercise in pregnancy were given. Prenatal testing, optional genetic testing, and ultrasound use in pregnancy were reviewed.  Cell-free DNA testing  vs 1st or second trimester screening: requested cell-free DNA testing with [id number]. Benefits of Breast Feeding were discussed. The patient is encouraged to consider nursing her baby post partum. Patient advised to begin baby aspirin 81 mg daily for h/o pre-eclampsia.  H/o IUGR in prior pregnancy, likely secondary to pre-eclampsia. Will monitor growth closely this pregnancy.  H/o preterm delivery secondary to induction for pre-eclampsia. Not a candidate for 17-OHP. The patient has Medicaid.  [facility] Medicaid Risk Screening Form completed today  Follow up in 4 weeks.  50% of 30 min visit spent on counseling and coordination of care.     Hildred Laserherry, Minela [place], MD [facility]

## 2018-11-10 NOTE — Progress Notes (Signed)
Patient comes in today for new OB physical. She has had nausea and breast tenderness. She is having some lower back pain.

## 2018-11-11 ENCOUNTER — Encounter: Payer: Self-pay | Admitting: Obstetrics and Gynecology

## 2018-11-11 ENCOUNTER — Other Ambulatory Visit: Payer: Self-pay | Admitting: Obstetrics and Gynecology

## 2018-11-11 DIAGNOSIS — Z8751 Personal history of pre-term labor: Secondary | ICD-10-CM | POA: Insufficient documentation

## 2018-11-11 DIAGNOSIS — O09299 Supervision of pregnancy with other poor reproductive or obstetric history, unspecified trimester: Secondary | ICD-10-CM | POA: Insufficient documentation

## 2018-11-11 DIAGNOSIS — O09899 Supervision of other high risk pregnancies, unspecified trimester: Secondary | ICD-10-CM | POA: Insufficient documentation

## 2018-11-11 LAB — COMPREHENSIVE METABOLIC PANEL
ALT: 12 IU/L (ref 0–32)
AST: 13 IU/L (ref 0–40)
Albumin/Globulin Ratio: 1.4 (ref 1.2–2.2)
Albumin: 3.9 g/dL (ref 3.9–5.0)
Alkaline Phosphatase: 58 IU/L (ref 39–117)
BUN/Creatinine Ratio: 14 (ref 9–23)
BUN: 9 mg/dL (ref 6–20)
Bilirubin Total: <0.2 mg/dL (ref 0.0–1.2)
CO2: 20 mmol/L (ref 20–29)
Calcium: 9.6 mg/dL (ref 8.7–10.2)
Chloride: 102 mmol/L (ref 96–106)
Creatinine, Ser: 0.66 mg/dL (ref 0.57–1.00)
GFR calc Af Amer: 137 mL/min/{1.73_m2} (ref >59)
GFR calc non Af Amer: 119 mL/min/{1.73_m2} (ref >59)
Globulin, Total: 2.8 g/dL (ref 1.5–4.5)
Glucose: 90 mg/dL (ref 65–99)
Potassium: 3.4 mmol/L — ABNORMAL LOW (ref 3.5–5.2)
Sodium: 138 mmol/L (ref 134–144)
Total Protein: 6.7 g/dL (ref 6.0–8.5)

## 2018-11-12 LAB — URINE CULTURE

## 2018-11-15 LAB — MATERNIT21  PLUS CORE+ESS+SCA, BLOOD

## 2018-11-21 ENCOUNTER — Encounter: Payer: Self-pay | Admitting: Obstetrics and Gynecology

## 2018-12-13 ENCOUNTER — Encounter: Payer: Medicaid Other | Admitting: Obstetrics and Gynecology

## 2018-12-19 ENCOUNTER — Encounter: Payer: Self-pay | Admitting: Obstetrics and Gynecology

## 2018-12-19 ENCOUNTER — Ambulatory Visit (INDEPENDENT_AMBULATORY_CARE_PROVIDER_SITE_OTHER): Payer: Medicaid Other | Admitting: Obstetrics and Gynecology

## 2018-12-19 ENCOUNTER — Other Ambulatory Visit: Payer: Self-pay

## 2018-12-19 VITALS — BP 117/79 | HR 88 | Wt 181.4 lb

## 2018-12-19 DIAGNOSIS — O0993 Supervision of high risk pregnancy, unspecified, third trimester: Secondary | ICD-10-CM

## 2018-12-19 LAB — POCT URINALYSIS DIPSTICK OB
Bilirubin, UA: NEGATIVE
Blood, UA: NEGATIVE
Glucose, UA: NEGATIVE
Ketones, UA: NEGATIVE
Leukocytes, UA: NEGATIVE
Nitrite, UA: NEGATIVE
Odor: NEGATIVE
POC,PROTEIN,UA: NEGATIVE
Spec Grav, UA: 1.01 (ref 1.010–1.025)
Urobilinogen, UA: 0.2 E.U./dL
pH, UA: 7.5 (ref 5.0–8.0)

## 2018-12-19 NOTE — Progress Notes (Signed)
ROB- h/a and blurred vision. NO meds tried.

## 2018-12-19 NOTE — Progress Notes (Signed)
ROB:  No problems.  Reports fetal movement.  Taking aspirin as directed.  Complains of occasional migraine headaches. (She says she has had these for her whole life and is managing them) FAS next visit

## 2019-01-09 ENCOUNTER — Encounter: Payer: Medicaid Other | Admitting: Obstetrics and Gynecology

## 2019-01-09 ENCOUNTER — Other Ambulatory Visit: Payer: Medicaid Other

## 2019-01-17 ENCOUNTER — Encounter: Payer: Self-pay | Admitting: Obstetrics and Gynecology

## 2019-01-17 ENCOUNTER — Ambulatory Visit (INDEPENDENT_AMBULATORY_CARE_PROVIDER_SITE_OTHER): Payer: Medicaid Other | Admitting: Obstetrics and Gynecology

## 2019-01-17 ENCOUNTER — Other Ambulatory Visit: Payer: Self-pay

## 2019-01-17 ENCOUNTER — Ambulatory Visit (INDEPENDENT_AMBULATORY_CARE_PROVIDER_SITE_OTHER): Payer: Medicaid Other

## 2019-01-17 VITALS — BP 135/85 | HR 105 | Wt 181.5 lb

## 2019-01-17 DIAGNOSIS — R630 Anorexia: Secondary | ICD-10-CM

## 2019-01-17 DIAGNOSIS — O0993 Supervision of high risk pregnancy, unspecified, third trimester: Secondary | ICD-10-CM

## 2019-01-17 DIAGNOSIS — Z3A22 22 weeks gestation of pregnancy: Secondary | ICD-10-CM

## 2019-01-17 DIAGNOSIS — O4440 Low lying placenta NOS or without hemorrhage, unspecified trimester: Secondary | ICD-10-CM

## 2019-01-17 DIAGNOSIS — O4442 Low lying placenta NOS or without hemorrhage, second trimester: Secondary | ICD-10-CM

## 2019-01-17 DIAGNOSIS — O09292 Supervision of pregnancy with other poor reproductive or obstetric history, second trimester: Secondary | ICD-10-CM

## 2019-01-17 DIAGNOSIS — Z3482 Encounter for supervision of other normal pregnancy, second trimester: Secondary | ICD-10-CM

## 2019-01-17 LAB — POCT URINALYSIS DIPSTICK OB
Bilirubin, UA: NEGATIVE
Blood, UA: NEGATIVE
Glucose, UA: NEGATIVE
Ketones, UA: NEGATIVE
Leukocytes, UA: NEGATIVE
Nitrite, UA: NEGATIVE
POC,PROTEIN,UA: NEGATIVE
Spec Grav, UA: 1.025 (ref 1.010–1.025)
Urobilinogen, UA: 0.2 E.U./dL
pH, UA: 8 (ref 5.0–8.0)

## 2019-01-17 NOTE — Patient Instructions (Signed)

## 2019-01-17 NOTE — Progress Notes (Signed)
ROB: Patient with occasional sharp pains in upper abdomen, lasting for few seconds then resolving. Likely secondary to fetal kicks as fetus noted to vertex on [place] today.  Reviewed anatomy scan, with LLP, repeat sono in 28 weeks. Discussed patient's decreased appetite, likely secondary to pregnancy and her decreased desire to eat meat. Discussed eating smaller portions, can get protein from other sources.  RTC in 4 weeks.

## 2019-01-17 NOTE — Progress Notes (Signed)
ROB-Pt present for routine prenatal care and u/s for anatomy. Pt stated having upper abd pain off and on. Pt declined flu vaccine.

## 2019-02-21 ENCOUNTER — Encounter: Payer: Self-pay | Admitting: Obstetrics and Gynecology

## 2019-02-21 ENCOUNTER — Other Ambulatory Visit: Payer: Self-pay

## 2019-02-21 ENCOUNTER — Ambulatory Visit (INDEPENDENT_AMBULATORY_CARE_PROVIDER_SITE_OTHER): Payer: Medicaid Other | Admitting: Obstetrics and Gynecology

## 2019-02-21 VITALS — BP 121/75 | HR 114 | Ht 62.0 in | Wt 185.7 lb

## 2019-02-21 DIAGNOSIS — N3001 Acute cystitis with hematuria: Secondary | ICD-10-CM | POA: Diagnosis not present

## 2019-02-21 DIAGNOSIS — Z23 Encounter for immunization: Secondary | ICD-10-CM | POA: Diagnosis not present

## 2019-02-21 DIAGNOSIS — Z3482 Encounter for supervision of other normal pregnancy, second trimester: Secondary | ICD-10-CM

## 2019-02-21 LAB — POCT URINALYSIS DIPSTICK OB
Bilirubin, UA: NEGATIVE
Glucose, UA: NEGATIVE
Ketones, UA: NEGATIVE
Nitrite, UA: NEGATIVE
Spec Grav, UA: 1.01 (ref 1.010–1.025)
Urobilinogen, UA: 0.2 E.U./dL
pH, UA: 7 (ref 5.0–8.0)

## 2019-02-21 MED ORDER — NITROFURANTOIN MONOHYD MACRO 100 MG PO CAPS: 100.0000 mg | ORAL_CAPSULE | Freq: Two times a day (BID) | ORAL | 1 refills | Status: DC

## 2019-02-21 NOTE — Progress Notes (Signed)
Patient comes in today for  ROB visit.  

## 2019-02-21 NOTE — Progress Notes (Signed)
ROB: UA consistent with UTI.  Treated with Macrobid.  Culture sent.  1 hour GCT in 2 weeks.  Ultrasound at that time for placental location.  Patient taking ASA daily as directed.  Patient desires IUD for postpartum birth control.  Constipation discussed use of Citrucel and increased fluid intake.  Tdap today.

## 2019-02-21 NOTE — Addendum Note (Signed)
Addended by: Durwin Glaze on: 02/21/2019 10:12 AM   Modules accepted: Orders

## 2019-02-23 LAB — URINE CULTURE

## 2019-02-28 ENCOUNTER — Other Ambulatory Visit: Payer: Medicaid Other

## 2019-02-28 ENCOUNTER — Other Ambulatory Visit: Payer: Self-pay

## 2019-02-28 DIAGNOSIS — Z3482 Encounter for supervision of other normal pregnancy, second trimester: Secondary | ICD-10-CM | POA: Diagnosis not present

## 2019-03-01 LAB — CBC
Hematocrit: 28.9 % — ABNORMAL LOW (ref 34.0–46.6)
Hemoglobin: 9.7 g/dL — ABNORMAL LOW (ref 11.1–15.9)
MCH: 30.2 pg (ref 26.6–33.0)
MCHC: 33.6 g/dL (ref 31.5–35.7)
MCV: 90 fL (ref 79–97)
Platelets: 188 10*3/uL (ref 150–450)
RBC: 3.21 x10E6/uL — ABNORMAL LOW (ref 3.77–5.28)
RDW: 12.6 % (ref 11.7–15.4)
WBC: 8.5 10*3/uL (ref 3.4–10.8)

## 2019-03-01 LAB — RPR: RPR Ser Ql: NONREACTIVE

## 2019-03-01 LAB — GLUCOSE, 1 HOUR GESTATIONAL: Gestational Diabetes Screen: 146 mg/dL — ABNORMAL HIGH (ref 65–139)

## 2019-03-05 NOTE — Progress Notes (Signed)
ROB-Pt present for u/s and routine prenatal care. Pt stated having lower abd pain and constipated. Pt stated that she tried mirlax without relief and is still constipated. Pt had traces of blood in her urine but denies any vaginal bleeding and pain with urination.  Flu vaccine given.

## 2019-03-06 ENCOUNTER — Other Ambulatory Visit: Payer: Self-pay

## 2019-03-06 ENCOUNTER — Ambulatory Visit (INDEPENDENT_AMBULATORY_CARE_PROVIDER_SITE_OTHER): Payer: Medicaid Other | Admitting: Obstetrics and Gynecology

## 2019-03-06 ENCOUNTER — Encounter: Payer: Self-pay | Admitting: Obstetrics and Gynecology

## 2019-03-06 ENCOUNTER — Ambulatory Visit (INDEPENDENT_AMBULATORY_CARE_PROVIDER_SITE_OTHER): Payer: Medicaid Other

## 2019-03-06 VITALS — BP 129/85 | HR 112 | Wt 185.6 lb

## 2019-03-06 DIAGNOSIS — Z8751 Personal history of pre-term labor: Secondary | ICD-10-CM

## 2019-03-06 DIAGNOSIS — Z3A28 28 weeks gestation of pregnancy: Secondary | ICD-10-CM

## 2019-03-06 DIAGNOSIS — Z23 Encounter for immunization: Secondary | ICD-10-CM

## 2019-03-06 DIAGNOSIS — O99013 Anemia complicating pregnancy, third trimester: Secondary | ICD-10-CM

## 2019-03-06 DIAGNOSIS — Z3483 Encounter for supervision of other normal pregnancy, third trimester: Secondary | ICD-10-CM

## 2019-03-06 DIAGNOSIS — O99613 Diseases of the digestive system complicating pregnancy, third trimester: Secondary | ICD-10-CM

## 2019-03-06 DIAGNOSIS — O09299 Supervision of pregnancy with other poor reproductive or obstetric history, unspecified trimester: Secondary | ICD-10-CM

## 2019-03-06 DIAGNOSIS — Z362 Encounter for other antenatal screening follow-up: Secondary | ICD-10-CM

## 2019-03-06 DIAGNOSIS — O4440 Low lying placenta NOS or without hemorrhage, unspecified trimester: Secondary | ICD-10-CM

## 2019-03-06 DIAGNOSIS — Z3482 Encounter for supervision of other normal pregnancy, second trimester: Secondary | ICD-10-CM

## 2019-03-06 DIAGNOSIS — K5900 Constipation, unspecified: Secondary | ICD-10-CM

## 2019-03-06 DIAGNOSIS — O09213 Supervision of pregnancy with history of pre-term labor, third trimester: Secondary | ICD-10-CM

## 2019-03-06 LAB — POCT URINALYSIS DIPSTICK OB
Bilirubin, UA: NEGATIVE
Glucose, UA: NEGATIVE
Ketones, UA: NEGATIVE
Nitrite, UA: NEGATIVE
POC,PROTEIN,UA: NEGATIVE
Spec Grav, UA: 1.01 (ref 1.010–1.025)
Urobilinogen, UA: 0.2 E.U./dL
pH, UA: 6.5 (ref 5.0–8.0)

## 2019-03-06 MED ORDER — FERROUS GLUCONATE 324 (38 FE) MG PO TABS: 324.0000 mg | ORAL_TABLET | Freq: Two times a day (BID) | ORAL | 3 refills | Status: DC

## 2019-03-06 NOTE — Progress Notes (Signed)
ROB: Patient notes moderate constipation, not initially relieved by Miralax, relieved with magnesium citrate. Advised on use of daily Miralax, apple juice/prune juice. Can also use enema as needed.  Desires to breastfeed,  desires BTL vs IUD for contraception. Received Tdap, signed blood consent last visit. 1 hr glucola elevated, needs 3 hr testing.  Anemic, will start on iron supplementation (warned if it worsens constipation to alternate to every other day dosing, increasing iron in diet). S/p growth scan today, normal, LLP resolved. Reports h/o UTI several weeks ago, completed antibiotics.  Flu vaccine given today.RTC in 2 weeks.

## 2019-03-06 NOTE — Progress Notes (Signed)
[id number] at [redacted]w[redacted]d presents for routine OB care. She reports increased fatigue. Not able to eat much as she feels full very quickly. Also reports constipation. Having bowel movements every 3 days. Tried Miralax which did not help. Took Magnesium Citrate which did help at the time but symptoms have returned. Also notes decreased urinary frequency.   Discussed reducing meal size/smaller more frequent meals.   Reports feeling good fetal movement. History of UTI x2 weeks ago. No severe cramping.

## 2019-03-06 NOTE — Patient Instructions (Signed)
Constipation, Adult Constipation is when a person:  Poops (has a bowel movement) fewer times in a week than normal.  Has a hard time pooping.  Has poop that is dry, hard, or bigger than normal. Follow these instructions at home: Eating and drinking   Eat foods that have a lot of fiber, such as: ? Fresh fruits and vegetables. ? Whole grains. ? Beans.  Eat less of foods that are high in fat, low in fiber, or overly processed, such as: ? [place] fries. ? Hamburgers. ? Cookies. ? Candy. ? Soda.  Drink enough fluid to keep your pee (urine) clear or pale yellow. General instructions  Exercise regularly or as told by your doctor.  Go to the restroom when you feel like you need to poop. Do not hold it in.  Take over-the-counter and prescription medicines only as told by your doctor. These include any fiber supplements.  Do pelvic floor retraining exercises, such as: ? Doing deep breathing while relaxing your lower belly (abdomen). ? Relaxing your pelvic floor while pooping.  Watch your condition for any changes.  Keep all follow-up visits as told by your doctor. This is important. Contact a doctor if:  You have pain that gets worse.  You have a fever.  You have not pooped for 4 days.  You throw up (vomit).  You are not hungry.  You lose weight.  You are bleeding from the anus.  You have thin, pencil-like poop (stool). Get help right away if:  You have a fever, and your symptoms suddenly get worse.  You leak poop or have blood in your poop.  Your belly feels hard or bigger than normal (is bloated).  You have very bad belly pain.  You feel dizzy or you faint. This information is not intended to replace advice given to you by your health care provider. Make sure you discuss any questions you have with your health care provider. Document Released: 10/06/2007 Document Revised: 04/01/2017 Document Reviewed: 10/08/2015 Elsevier Patient Education  2020 [place].    Pregnancy and Anemia  Anemia is a condition in which the concentration of red blood cells, or hemoglobin, in the blood is below normal. Hemoglobin is a substance in red blood cells that carries oxygen to the tissues of the body. Anemia results when enough oxygen does not reach these tissues. Anemia is common during pregnancy because the woman's body needs more blood volume and blood cells to provide nutrition to the fetus. The fetus needs iron and folic acid as it is developing. Your body may not produce enough red blood cells because of this. Also, during pregnancy, the liquid part of the blood (plasma) increases by about 30-50%, and the red blood cells increase by only 20%. This lowers the concentration of the red blood cells and creates a natural anemia-like situation. What are the causes? The most common cause of anemia during pregnancy is not having enough iron in the body to make red blood cells (iron deficiency anemia). Other causes may include:  Folic acid deficiency.  Vitamin B12 deficiency.  Certain prescription or over-the-counter medicines.  Certain medical conditions or infections that destroy red blood cells.  A low platelet count and bleeding caused by antibodies that go through the placenta to the fetus from the mother's blood. What are the signs or symptoms? Mild anemia may not be noticeable. If it becomes severe, symptoms may include:  Feeling tired (fatigue).  Shortness of breath, especially during activity.  Weakness.  Fainting.  Pale looking skin.  Headaches.  A fast or irregular heartbeat (palpitations).  Dizziness. How is this diagnosed? This condition may be diagnosed based on:  Your medical history and a physical exam.  Blood tests. How is this treated? Treatment for anemia during pregnancy depends on the cause of the anemia. Treatment can include:  Dietary changes.  Supplements of iron, vitamin B12, or folic acid.  A blood  transfusion. This may be needed if anemia is severe.  Hospitalization. This may be needed if there is a lot of blood loss or severe anemia. Follow these instructions at home:  Follow recommendations from your dietitian or health care provider about changing your diet.  Increase your vitamin C intake. This will help the stomach absorb more iron. Some foods that are high in vitamin C include: ? Oranges. ? Peppers. ? Tomatoes. ? Mangoes.  Eat a diet rich in iron. This would include foods such as: ? Liver. ? Beef. ? Eggs. ? Whole grains. ? Spinach. ? Dried fruit.  Take iron and vitamins as told by your health care provider.  Eat green leafy vegetables. These are a good source of folic acid.  Keep all follow-up visits as told by your health care provider. This is important. Contact a health care provider if:  You have frequent or lasting headaches.  You look pale.  You bruise easily. Get help right away if:  You have extreme weakness, shortness of breath, or chest pain.  You become dizzy or have trouble concentrating.  You have heavy vaginal bleeding.  You develop a rash.  You have bloody or black, tarry stools.  You faint.  You vomit up blood.  You vomit repeatedly.  You have abdominal pain.  You have a fever.  You are dehydrated. Summary  Anemia is a condition in which the concentration of red blood cells or hemoglobin in the blood is below normal.  Anemia is common during pregnancy because the woman's body needs more blood volume and blood cells to provide nutrition to the fetus.  The most common cause of anemia during pregnancy is not having enough iron in the body to make red blood cells (iron deficiency anemia).  Mild anemia may not be noticeable. If it becomes severe, symptoms may include feeling tired and weak. This information is not intended to replace advice given to you by your health care provider. Make sure you discuss any questions you have  with your health care provider. Document Released: 04/16/2000 Document Revised: 08/11/2018 Document Reviewed: 05/25/2016 Elsevier Patient Education  2020 [employer].

## 2019-03-09 ENCOUNTER — Other Ambulatory Visit: Payer: Self-pay

## 2019-03-09 DIAGNOSIS — Z3483 Encounter for supervision of other normal pregnancy, third trimester: Secondary | ICD-10-CM

## 2019-03-12 ENCOUNTER — Other Ambulatory Visit: Payer: Self-pay

## 2019-03-12 ENCOUNTER — Other Ambulatory Visit: Payer: Medicaid Other

## 2019-03-12 DIAGNOSIS — Z3483 Encounter for supervision of other normal pregnancy, third trimester: Secondary | ICD-10-CM | POA: Diagnosis not present

## 2019-03-13 LAB — GESTATIONAL GLUCOSE TOLERANCE
Glucose, Fasting: 82 mg/dL (ref 65–94)
Glucose, GTT - 1 Hour: 171 mg/dL (ref 65–179)
Glucose, GTT - 2 Hour: 161 mg/dL — ABNORMAL HIGH (ref 65–154)
Glucose, GTT - 3 Hour: 140 mg/dL — ABNORMAL HIGH (ref 65–139)

## 2019-03-14 ENCOUNTER — Other Ambulatory Visit: Payer: Self-pay

## 2019-03-14 DIAGNOSIS — Z3483 Encounter for supervision of other normal pregnancy, third trimester: Secondary | ICD-10-CM

## 2019-03-21 ENCOUNTER — Other Ambulatory Visit: Payer: Self-pay

## 2019-03-21 ENCOUNTER — Encounter: Payer: Self-pay | Admitting: Obstetrics and Gynecology

## 2019-03-21 ENCOUNTER — Ambulatory Visit (INDEPENDENT_AMBULATORY_CARE_PROVIDER_SITE_OTHER): Payer: Medicaid Other | Admitting: Obstetrics and Gynecology

## 2019-03-21 VITALS — BP 135/89 | HR 111 | Wt 185.2 lb

## 2019-03-21 DIAGNOSIS — N3001 Acute cystitis with hematuria: Secondary | ICD-10-CM | POA: Diagnosis not present

## 2019-03-21 DIAGNOSIS — Z3483 Encounter for supervision of other normal pregnancy, third trimester: Secondary | ICD-10-CM

## 2019-03-21 LAB — POCT URINALYSIS DIPSTICK OB
Bilirubin, UA: NEGATIVE
Glucose, UA: NEGATIVE
Ketones, UA: NEGATIVE
Nitrite, UA: NEGATIVE
POC,PROTEIN,UA: NEGATIVE
Spec Grav, UA: 1.01 (ref 1.010–1.025)
Urobilinogen, UA: 0.2 E.U./dL
pH, UA: 7 (ref 5.0–8.0)

## 2019-03-21 MED ORDER — NITROFURANTOIN MONOHYD MACRO 100 MG PO CAPS: 100.0000 mg | ORAL_CAPSULE | Freq: Two times a day (BID) | ORAL | 1 refills | Status: DC

## 2019-03-21 NOTE — Progress Notes (Signed)
ROB: Refer to lifestyles for gestational diabetes counseling and monitoring.  Possible continued UTI-culture sent.  Begin Macrobid.  Continue aspirin.  Taking iron as directed.  Gestational diabetes discussed in detail patient referred to [facility]yles we will bring sugar log with next visit.  Patient has decided on IUD for birth control.

## 2019-03-21 NOTE — Addendum Note (Signed)
Addended by: Durwin Glaze on: 03/21/2019 04:27 PM   Modules accepted: Orders

## 2019-03-21 NOTE — Addendum Note (Signed)
Addended by: Durwin Glaze on: 03/21/2019 04:28 PM   Modules accepted: Orders

## 2019-03-23 LAB — URINE CULTURE

## 2019-04-02 ENCOUNTER — Ambulatory Visit: Payer: Self-pay | Admitting: Dietician

## 2019-04-03 ENCOUNTER — Encounter: Payer: Self-pay | Admitting: *Deleted

## 2019-04-03 ENCOUNTER — Encounter: Payer: Medicaid Other | Attending: Obstetrics and Gynecology | Admitting: *Deleted

## 2019-04-03 ENCOUNTER — Ambulatory Visit (INDEPENDENT_AMBULATORY_CARE_PROVIDER_SITE_OTHER): Payer: Medicaid Other | Admitting: Obstetrics and Gynecology

## 2019-04-03 ENCOUNTER — Other Ambulatory Visit: Payer: Self-pay

## 2019-04-03 ENCOUNTER — Encounter: Payer: Self-pay | Admitting: Obstetrics and Gynecology

## 2019-04-03 VITALS — BP 126/72 | Ht 62.0 in | Wt 184.7 lb

## 2019-04-03 VITALS — BP 135/91 | HR 118 | Wt 185.6 lb

## 2019-04-03 DIAGNOSIS — O99613 Diseases of the digestive system complicating pregnancy, third trimester: Secondary | ICD-10-CM

## 2019-04-03 DIAGNOSIS — Z713 Dietary counseling and surveillance: Secondary | ICD-10-CM | POA: Diagnosis not present

## 2019-04-03 DIAGNOSIS — O0993 Supervision of high risk pregnancy, unspecified, third trimester: Secondary | ICD-10-CM

## 2019-04-03 DIAGNOSIS — O24419 Gestational diabetes mellitus in pregnancy, unspecified control: Secondary | ICD-10-CM | POA: Diagnosis not present

## 2019-04-03 DIAGNOSIS — K5900 Constipation, unspecified: Secondary | ICD-10-CM

## 2019-04-03 DIAGNOSIS — O24410 Gestational diabetes mellitus in pregnancy, diet controlled: Secondary | ICD-10-CM | POA: Insufficient documentation

## 2019-04-03 DIAGNOSIS — O09299 Supervision of pregnancy with other poor reproductive or obstetric history, unspecified trimester: Secondary | ICD-10-CM

## 2019-04-03 DIAGNOSIS — Z3A34 34 weeks gestation of pregnancy: Secondary | ICD-10-CM | POA: Insufficient documentation

## 2019-04-03 DIAGNOSIS — Z8751 Personal history of pre-term labor: Secondary | ICD-10-CM

## 2019-04-03 DIAGNOSIS — O09899 Supervision of other high risk pregnancies, unspecified trimester: Secondary | ICD-10-CM

## 2019-04-03 LAB — POCT URINALYSIS DIPSTICK OB
Bilirubin, UA: NEGATIVE
Blood, UA: NEGATIVE
Glucose, UA: NEGATIVE
Ketones, UA: NEGATIVE
Nitrite, UA: NEGATIVE
POC,PROTEIN,UA: NEGATIVE
Spec Grav, UA: <=1.005 — AB (ref 1.010–1.025)
Urobilinogen, UA: 0.2 E.U./dL
pH, UA: 7.5 (ref 5.0–8.0)

## 2019-04-03 MED ORDER — ACCU-CHEK GUIDE VI STRP: ORAL_STRIP | 12 refills | Status: DC

## 2019-04-03 MED ORDER — ACCU-CHEK FASTCLIX LANCETS MISC: 1.0000 | Freq: Four times a day (QID) | 12 refills | Status: DC

## 2019-04-03 NOTE — Progress Notes (Signed)
ROB: Had diabetes class today, sees nutritionist next week. Prescription for glucose monitoring supplies given. Otherwise doing well.  Continue to monitor BP's, mildly elevated diastolic today, h/o pre-eclampsia last pregnancy. Discussed continued measures for constipation (currenlty managing well on apple juice and Miralax, but may have to discontinue juice due to newly diagnosed GDM).  RTC in 2 weeks. To begin antenatal testing at 36 weeks, and for growth scan at that time (GDM, h/o IUGR in prior pregnancy).

## 2019-04-03 NOTE — Progress Notes (Signed)
Diabetes Self-Management Education  Visit Type: First/Initial  Appt. Start Time: 1050 Appt. End Time: 8527  04/03/2019  [name], identified by name and date of birth, is a 33 y.o. female with a diagnosis of Diabetes: Gestational Diabetes.   ASSESSMENT  Blood pressure 126/72, height 5\' 2"  (1.575 m), weight 184 lb 11.2 oz (83.8 kg), last menstrual period 08/16/2018, estimated date of delivery 05/23/2019  Body mass index is 33.78 kg/m.  Diabetes Self-Management Education - 04/03/19 1345      Visit Information   Visit Type  First/Initial      Initial Visit   Diabetes Type  Gestational Diabetes    Are you currently following a meal plan?  No    Are you taking your medications as prescribed?  Yes    Date Diagnosed  November 2020      Health Coping   How would you rate your overall health?  Good      Psychosocial Assessment   Patient Belief/Attitude about Diabetes  Other (comment)   "tired"   Self-care barriers  None    Self-management support  Doctor's office    Patient Concerns  Nutrition/Meal planning;Monitoring;Glycemic Control    Special Needs  None    Preferred Learning Style  Hands on;Auditory    Learning Readiness  Ready    How often do you need to have someone help you when you read instructions, pamphlets, or other written materials from your doctor or pharmacy?  1 - Never    What is the last grade level you completed in school?  [employer]      Pre-Education Assessment   Patient understands the diabetes disease and treatment process.  Needs Instruction    Patient understands incorporating nutritional management into lifestyle.  Needs Instruction    Patient undertands incorporating physical activity into lifestyle.  Needs Instruction    Patient understands using medications safely.  Needs Instruction    Patient understands monitoring blood glucose, interpreting and using results  Needs Instruction    Patient understands prevention, detection, and  treatment of acute complications.  Needs Instruction    Patient understands prevention, detection, and treatment of chronic complications.  Needs Instruction    Patient understands how to develop strategies to address psychosocial issues.  Needs Instruction    Patient understands how to develop strategies to promote health/change behavior.  Needs Instruction      Complications   Last HgB A1C per patient/outside source  5 %   10/10/2018   How often do you check your blood sugar?  0 times/day (not testing)   Provided Accu-Chek Guide Me meter and instructed on use. BG upon return demonstration was 81 mg/dL at 12:10 pm - 4 hrs pp.   Have you had a dilated eye exam in the past 12 months?  Yes    Have you had a dental exam in the past 12 months?  No    Are you checking your feet?  No      Dietary Intake   Breakfast  "sometimes I can't eat in the morning" - bread, pretzels    Lunch  chicken, beef, fries, veggies like squash, zucchini; salad; soup    Snack (afternoon)  trail mix, fruit cups; milk and oreos    Dinner  chicken and occasional beef, potatoes, beans, corn, pasta, green beans, salads    Beverage(s)  fruit juice, sugar sweetened tea, water, milk      Exercise   Exercise Type  Light (walking / raking leaves)  How many days per week to you exercise?  3    How many minutes per day do you exercise?  40    Total minutes per week of exercise  120      Patient Education   Previous Diabetes Education  No    Disease state   Definition of diabetes, type 1 and 2, and the diagnosis of diabetes;Factors that contribute to the development of diabetes    Nutrition management   Role of diet in the treatment of diabetes and the relationship between the three main macronutrients and blood glucose level;Food label reading, portion sizes and measuring food.;Reviewed blood glucose goals for pre and post meals and how to evaluate the patients' food intake on their blood glucose level.    Physical  activity and exercise   Role of exercise on diabetes management, blood pressure control and cardiac health.    Medications  Other (comment)   Limited use of oral medications during pregnancy and possibility of insulin.   Monitoring  Taught/evaluated SMBG meter.;Purpose and frequency of SMBG.;Taught/discussed recording of test results and interpretation of SMBG.;Ketone testing, when, how.    Chronic complications  Relationship between chronic complications and blood glucose control    Psychosocial adjustment  Identified and addressed patients feelings and concerns about diabetes    Preconception care  Pregnancy and GDM  Role of pre-pregnancy blood glucose control on the development of the fetus;Reviewed with patient blood glucose goals with pregnancy;Role of family planning for patients with diabetes      Individualized Goals (developed by patient)   Reducing Risk Improve blood sugars Prevent diabetes complications     Outcomes   Expected Outcomes  Demonstrated interest in learning. Expect positive outcomes       Individualized Plan for Diabetes Self-Management Training:   Learning Objective:  Patient will have a greater understanding of diabetes self-management. Patient education plan is to attend individual and/or group sessions per assessed needs and concerns.   Plan:   Patient Instructions  Read booklet on Gestational Diabetes Follow Gestational Meal Planning Guidelines Avoid fruit juices and sugar sweetened drinks (tea) Limit desserts and fried foods Complete a 3 Day Food Record and bring to next appointment Check blood sugars 4 x day - before breakfast and 2 hrs after every meal and record  Bring blood sugar log to all appointments Call MD for prescription for meter strips and lancets Strips   Accu-Chek Guide Lancets   Accu-Chek FastClix Purchase urine ketone strips if ordered by MD and check urine ketones every am:  If + increase bedtime snack to 1 protein and 2 carbohydrate  servings Walk 20-30 minutes at least 5 x week if permitted by MD  Expected Outcomes:  Demonstrated interest in learning. Expect positive outcomes  Education material provided: Gestational Booklet Gestational Meal Planning Guidelines Simple Meal Plan Viewed Gestational Diabetes Video Meter = Accu-Chek Guide Me 3 Day Food Record Goals for a Healthy Pregnancy  If problems or questions, patient to contact team via:  Sharion Settler, RN, CCM, CDE [phone]  Future DSME appointment:  April 11, 2019 with the dietitian

## 2019-04-03 NOTE — Patient Instructions (Signed)
Gestational Diabetes Mellitus, Diagnosis Gestational diabetes (gestational diabetes mellitus) is a temporary form of diabetes that some women develop during pregnancy. It usually occurs around weeks 24-28 of pregnancy, and it goes away after delivery. Hormonal changes during pregnancy can interfere with insulin production and function, which may result in one or both of these problems:  The pancreas does not make enough of a hormone called insulin.  Cells in the body do not respond properly to insulin that the body makes (insulin resistance). Normally, insulin allows blood sugar (glucose) to enter cells in the body. The cells use glucose for energy. Insulin resistance or lack of insulin causes excess glucose to build up in the blood instead of going into cells. As a result, high blood glucose (hyperglycemia) develops. If gestational diabetes is treated, it is not likely to cause problems. If it is not controlled with treatment, it may cause problems during labor and delivery, and some of those problems can be harmful to the unborn baby (fetus) and the mother. Women who get gestational diabetes are more likely to develop it if they get pregnant again, and they are more likely to develop type 2 diabetes in the future. What increases the risk? This condition may be more likely to develop in pregnant women who:  Are older than age 25 during pregnancy.  Have a family history of diabetes.  Are overweight.  Had gestational diabetes in the past.  Have polycystic ovary syndrome (PCOS).  Are pregnant with twins or multiples.  Are of American-Indian, African-American, Hispanic/Latino, or Asian/Pacific Islander descent. What are the signs or symptoms? Most women do not notice symptoms of gestational diabetes because the symptoms are similar to normal symptoms of pregnancy. Symptoms of gestational diabetes may include:  Increased thirst (polydipsia).  Increased hunger(polyphagia).  Increased  urination (polyuria). How is this diagnosed? This condition may be diagnosed based on your blood glucose level, which may be checked with one or more of the following blood tests:  A fasting blood glucose (FBG) test. You will not be allowed to eat (you will fast) for 8 hours or longer before a blood sample is taken.  A random blood glucose test. This checks your blood glucose at any time of day regardless of when you ate.  An oral glucose tolerance test (OGTT). This is usually done during weeks 24-28 of pregnancy. ? For this test, you will have an FBG test done. Then, you will drink a beverage that contains glucose. Your blood glucose will be tested again one hour after you drink the glucose beverage (1-hour OGTT). ? If the 1-hour OGTT result is at or above 140 mg/dL (7.8 mmol/L), you will repeat the OGTT. This time, your blood glucose will be tested 3 hours after you drink the glucose beverage (3-hour OGTT). If you have risk factors, you may be screened for undiagnosed type 2 diabetes at your first health care visit during your pregnancy (prenatal visit). How is this treated?     Your treatment may be managed by a specialist called an endocrinologist. This condition is treated by following instructions from your health care provider about:  Eating a healthy diet and getting more physical activity. These changes are the most important ways to manage gestational diabetes.  Checking your blood glucose. Do this as often as told.  Taking diabetes medicines or insulin every day. These will only be prescribed if they are needed. ? If you use insulin, you may need to adjust your dosage based on how physically active   you are and what foods you eat. Your health care provider will tell you how to do this. Your health care provider will set treatment goals for you based on the stage of your pregnancy and any other medical conditions you have. Generally, the goal of treatment is to maintain the following  blood glucose levels during pregnancy:  Before meals (preprandial): at or below 95 mg/dL (5.3 mmol/L).  After meals (postprandial): ? One hour after a meal: at or below 140 mg/dL (7.8 mmol/L). ? Two hours after a meal: at or below 120 mg/dL (6.7 mmol/L).  A1c (hemoglobin A1c) level: 6-6.5%. Follow these instructions at home: Questions to ask your health care provider  Consider asking the following questions: ? Do I need to meet with a diabetes educator? ? What equipment will I need to manage my diabetes at home? ? What diabetes medicines do I need, and when should I take them? ? How often do I need to check my blood glucose? ? What number can I call if I have questions? ? When is my next appointment? General instructions  Take over-the-counter and prescription medicines only as told by your health care provider.  Manage your weight gain during pregnancy. The amount of weight that you are expected to gain depends on your pre-pregnancy BMI (body mass index).  Keep all follow-up visits as told by your health care provider. This is important.  For more information about diabetes, visit: ? American Diabetes Association (ADA): www.diabetes.org ? American Association of Diabetes Educators (AADE): www.diabeteseducator.org Contact a health care provider if:  Your blood glucose level is at or above 240 mg/dL (13.3 mmol/L).  Your blood glucose level is at or above 200 mg/dL (11.1 mmol/L) and you have ketones in your urine.  You have been sick or have had a fever for 2 days or longer and you are not getting better.  You have any of the following problems for more than 6 hours: ? You cannot eat or drink. ? You have nausea and vomiting. ? You have diarrhea. Get help right away if:  Your blood glucose is lower than 54 mg/dL (3 mmol/L).  You become confused or you have trouble thinking clearly.  You have difficulty breathing.  You have moderate or large ketone levels in your urine.   Your baby is moving around less than usual.  You develop unusual discharge or bleeding from your vagina.  You start having contractions early (prematurely). Contractions may feel like a tightening in your lower abdomen. Summary  Gestational diabetes (gestational diabetes mellitus) is a temporary form of diabetes that some women develop during pregnancy. It usually occurs around weeks 24-28 of pregnancy, and it goes away after delivery.  This condition is treated by making diet and lifestyle changes and taking diabetes medicines or insulin, if needed.  Women who get gestational diabetes are more likely to develop it if they get pregnant again, and they are more likely to develop type 2 diabetes in the future. This information is not intended to replace advice given to you by your health care provider. Make sure you discuss any questions you have with your health care provider. Document Released: 07/26/2000 Document Revised: 05/26/2017 Document Reviewed: 05/23/2015 Elsevier Patient Education  2020 [employer]  

## 2019-04-03 NOTE — Patient Instructions (Signed)
Read booklet on Gestational Diabetes Follow Gestational Meal Planning Guidelines Avoid fruit juices and sugar sweetened drinks (tea) Limit desserts and fried foods Complete a 3 Day Food Record and bring to next appointment Check blood sugars 4 x day - before breakfast and 2 hrs after every meal and record  Bring blood sugar log to all appointments Call MD for prescription for meter strips and lancets Strips   Accu-Chek Guide Lancets   Accu-Chek FastClix Purchase urine ketone strips if ordered by MD and check urine ketones every am:  If + increase bedtime snack to 1 protein and 2 carbohydrate servings Walk 20-30 minutes at least 5 x week if permitted by MD

## 2019-04-03 NOTE — Progress Notes (Signed)
ROB-Pt present for routine prenatal care. Pt stated that she was doing well no problems at this time.  

## 2019-04-11 ENCOUNTER — Other Ambulatory Visit: Payer: Self-pay

## 2019-04-11 ENCOUNTER — Encounter: Payer: Medicaid Other | Admitting: Skilled Nursing Facility1

## 2019-04-11 DIAGNOSIS — O24419 Gestational diabetes mellitus in pregnancy, unspecified control: Secondary | ICD-10-CM | POA: Diagnosis not present

## 2019-04-11 DIAGNOSIS — O24410 Gestational diabetes mellitus in pregnancy, diet controlled: Secondary | ICD-10-CM

## 2019-04-11 DIAGNOSIS — Z3A34 34 weeks gestation of pregnancy: Secondary | ICD-10-CM | POA: Diagnosis not present

## 2019-04-11 DIAGNOSIS — Z713 Dietary counseling and surveillance: Secondary | ICD-10-CM | POA: Diagnosis not present

## 2019-04-11 NOTE — Progress Notes (Signed)
Pt states she is about [redacted] weeks along.  Pt states she logged her information in her phone.  Pt states she has been checking her blood sugar 4 times a day unless she feels nausea. Pt states she experienced nausea once when she first wakes up needing cracker and gingerale throughout the day.  Fasting: 93, 87, 88, 83, 93, 86, 91 Post prandial: 129 (grilled chicken salad and tortilla strips with ranch and cheese quesadilla and water; 147 (grilled cheese, gingerale, soup ([place] stew). Pt states she has nausea and reflux often causing her to have a limited appetite.  Pt states has a bowel movement every 3-4 days with difficulty. Trying miralax with apple juice and water very other day.   Pt states she craves buffalo sauce (deititian eductated pt on this food worsening nausea/acidic stomach but pt states she will still eat it) Pt states she does not snack because she is not hungry.  Pt states her activity is chasing her 43 month old.   Pt was educated on dietary ways to decrease nausea and acidic stomach.   24 hr recall: prtzel stick and peanut butter (1 tsp) Leftovers: buffalo chicken wrap with ranch and lettuce and pineapple  spagetti with deer meat Beverages: milk, 3 32 ounces of water   Goals: Try alkaline water pH 8.8 As soon as you wake up eat saltines  Try muscle milk  Avoid: chocolate, pineapple, oranges, lemon, peppermint, coffee Try protein20 protein supplement  Try meatless crumbles (soy or pea protein) gardein or morning [employer] Wait until right after dinner to take your prenatal Do not take your iron pill with milk   If you are having bad nausea dn no apatite drink milk Weigh yourself weekly to ensure you are not dropping weight

## 2019-04-18 ENCOUNTER — Ambulatory Visit (INDEPENDENT_AMBULATORY_CARE_PROVIDER_SITE_OTHER): Payer: Medicaid Other | Admitting: Obstetrics and Gynecology

## 2019-04-18 ENCOUNTER — Other Ambulatory Visit: Payer: Self-pay

## 2019-04-18 ENCOUNTER — Encounter: Payer: Self-pay | Admitting: Obstetrics and Gynecology

## 2019-04-18 VITALS — BP 119/79 | HR 101 | Wt 186.3 lb

## 2019-04-18 DIAGNOSIS — O0993 Supervision of high risk pregnancy, unspecified, third trimester: Secondary | ICD-10-CM

## 2019-04-18 LAB — POCT URINALYSIS DIPSTICK OB
Bilirubin, UA: NEGATIVE
Glucose, UA: NEGATIVE
Ketones, UA: NEGATIVE
Leukocytes, UA: NEGATIVE
Nitrite, UA: NEGATIVE
Spec Grav, UA: 1.01 (ref 1.010–1.025)
Urobilinogen, UA: 0.2 E.U./dL
pH, UA: 7 (ref 5.0–8.0)

## 2019-04-18 NOTE — Progress Notes (Signed)
ROB:  Sugars - great.  Down to 2X/day.  Urine for C&S.    [place] growth and BPP next visit then begin NSTs weekly.  Cultures next visit.

## 2019-04-18 NOTE — Progress Notes (Signed)
Patient comes in today for ROB visit. She has no concerns today.  

## 2019-04-18 NOTE — Addendum Note (Signed)
Addended by: Durwin Glaze on: 04/18/2019 03:58 PM   Modules accepted: Orders

## 2019-04-20 LAB — URINE CULTURE

## 2019-04-26 ENCOUNTER — Ambulatory Visit (INDEPENDENT_AMBULATORY_CARE_PROVIDER_SITE_OTHER): Payer: Medicaid Other | Admitting: Obstetrics and Gynecology

## 2019-04-26 ENCOUNTER — Encounter: Payer: Self-pay | Admitting: Obstetrics and Gynecology

## 2019-04-26 ENCOUNTER — Other Ambulatory Visit: Payer: Self-pay

## 2019-04-26 ENCOUNTER — Ambulatory Visit (INDEPENDENT_AMBULATORY_CARE_PROVIDER_SITE_OTHER): Payer: Medicaid Other

## 2019-04-26 VITALS — BP 117/74 | HR 97 | Wt 182.8 lb

## 2019-04-26 DIAGNOSIS — O24410 Gestational diabetes mellitus in pregnancy, diet controlled: Secondary | ICD-10-CM

## 2019-04-26 DIAGNOSIS — Z3483 Encounter for supervision of other normal pregnancy, third trimester: Secondary | ICD-10-CM | POA: Diagnosis not present

## 2019-04-26 DIAGNOSIS — Z3689 Encounter for other specified antenatal screening: Secondary | ICD-10-CM | POA: Diagnosis not present

## 2019-04-26 DIAGNOSIS — Z3A36 36 weeks gestation of pregnancy: Secondary | ICD-10-CM

## 2019-04-26 DIAGNOSIS — O0993 Supervision of high risk pregnancy, unspecified, third trimester: Secondary | ICD-10-CM

## 2019-04-26 DIAGNOSIS — O09893 Supervision of other high risk pregnancies, third trimester: Secondary | ICD-10-CM

## 2019-04-26 DIAGNOSIS — O09293 Supervision of pregnancy with other poor reproductive or obstetric history, third trimester: Secondary | ICD-10-CM

## 2019-04-26 DIAGNOSIS — O09899 Supervision of other high risk pregnancies, unspecified trimester: Secondary | ICD-10-CM

## 2019-04-26 DIAGNOSIS — O99891 Other specified diseases and conditions complicating pregnancy: Secondary | ICD-10-CM

## 2019-04-26 DIAGNOSIS — O09299 Supervision of pregnancy with other poor reproductive or obstetric history, unspecified trimester: Secondary | ICD-10-CM

## 2019-04-26 DIAGNOSIS — M549 Dorsalgia, unspecified: Secondary | ICD-10-CM

## 2019-04-26 LAB — POCT URINALYSIS DIPSTICK OB
Bilirubin, UA: NEGATIVE
Glucose, UA: NEGATIVE
Ketones, UA: NEGATIVE
Leukocytes, UA: NEGATIVE
Nitrite, UA: NEGATIVE
Spec Grav, UA: 1.01 (ref 1.010–1.025)
Urobilinogen, UA: 0.2 E.U./dL
pH, UA: 7.5 (ref 5.0–8.0)

## 2019-04-26 NOTE — Progress Notes (Signed)
ROB-Pt present for routine prenatal care. Pt stated having lower back pain.  

## 2019-04-26 NOTE — Progress Notes (Signed)
ROB: Patient complains of back pain. Reports having back labor last time. Notes occasions where she has had headaches and her blood pressure was elevated but resolved.   Also noting some nausea now again.  Has not tried anything, notes she is just dealing with it.  Blood sugars wnl.  S/p growth scan, normal growth, BPP 8/8.  For 36 week labs today.  RTC in 1 week, for weekly NSTs.

## 2019-04-28 LAB — STREP GP B NAA: Strep Gp B NAA: NEGATIVE

## 2019-04-30 LAB — GC/CHLAMYDIA PROBE AMP
Chlamydia trachomatis, NAA: NEGATIVE
Neisseria Gonorrhoeae by PCR: NEGATIVE

## 2019-05-02 ENCOUNTER — Ambulatory Visit (INDEPENDENT_AMBULATORY_CARE_PROVIDER_SITE_OTHER): Payer: Medicaid Other | Admitting: Obstetrics and Gynecology

## 2019-05-02 ENCOUNTER — Other Ambulatory Visit: Payer: Medicaid Other

## 2019-05-02 ENCOUNTER — Encounter: Payer: Self-pay | Admitting: Obstetrics and Gynecology

## 2019-05-02 ENCOUNTER — Other Ambulatory Visit: Payer: Self-pay

## 2019-05-02 VITALS — BP 128/85 | HR 105 | Wt 186.1 lb

## 2019-05-02 DIAGNOSIS — O24410 Gestational diabetes mellitus in pregnancy, diet controlled: Secondary | ICD-10-CM

## 2019-05-02 DIAGNOSIS — Z3A37 37 weeks gestation of pregnancy: Secondary | ICD-10-CM | POA: Diagnosis not present

## 2019-05-02 DIAGNOSIS — Z3483 Encounter for supervision of other normal pregnancy, third trimester: Secondary | ICD-10-CM

## 2019-05-02 NOTE — Progress Notes (Signed)
ROB: Signs and symptoms of labor reviewed.  NST reactive today.  No contractions.  Sugars excellent.

## 2019-05-04 NOTE — L&D Delivery Note (Signed)
Delivery Summary for Jillian Anderson  Labor Events:   Preterm labor: No data found  Rupture date: 05/15/2019  Rupture time: 5:00 AM  Rupture type: Spontaneous Possible ROM - for evaluation  Fluid Color: Clear  Induction: No data found  Augmentation: No data found  Complications: No data found  Cervical ripening: No data found No data found   No data found     Delivery:   Episiotomy: No data found  Lacerations: No data found  Repair suture: No data found  Repair # of packets: No data found  Blood loss (ml): 250 ml   Information for the patient's newborn:  Filicia, Scogin [[id number]]   Delivery 05/15/2019 7:02 AM by  Vaginal, Spontaneous Sex:  female Gestational Age: [redacted]w[redacted]d Delivery Clinician:   Living?:         APGARS  One minute Five minutes Ten minutes  Skin color:        Heart rate:        Grimace:        Muscle tone:        Breathing:        Totals: 8  9      Presentation/position:      Resuscitation:   Cord information:    Disposition of cord blood:     Blood gases sent?  Complications:   Placenta: Delivered:       appearance Newborn Measurements: Weight: 6 lb 8.4 oz (2960 g)  Height: 20.08"  Head circumference:    Chest circumference:    Other providers:    Additional  information: Forceps:   Vacuum:   Breech:   Observed anomalies     Delivery Note At 7:02 AM a viable and healthy female was delivered precipitously via Vaginal, Spontaneous (Presentation: Vertex, LOA position) by Tresea Mall, CNM who was on standby for delivery until my arrivial.  APGAR: 8, 9; weight  2960 grams. I arrived at approximately 7:04 AM and was able to deliver the placenta. Placenta status: Spontaneously removed, Intact.  Cord: 3 vessels with the following complications: None, however short in length.  Cord pH: not obtained.  Delayed cord clamping was observed.   Anesthesia: None Episiotomy: None Lacerations: None Suture Repair:  None Est. Blood Loss (mL):  250 ml  Mom  to postpartum.  Baby to Couplet care / Skin to Skin.  Hildred Laser, MD 05/15/2019, 7:45 AM

## 2019-05-09 ENCOUNTER — Ambulatory Visit (INDEPENDENT_AMBULATORY_CARE_PROVIDER_SITE_OTHER): Payer: Medicaid Other | Admitting: Obstetrics and Gynecology

## 2019-05-09 ENCOUNTER — Other Ambulatory Visit: Payer: Self-pay

## 2019-05-09 ENCOUNTER — Encounter: Payer: Self-pay | Admitting: Obstetrics and Gynecology

## 2019-05-09 VITALS — BP 126/89 | HR 96 | Wt 180.5 lb

## 2019-05-09 DIAGNOSIS — O09299 Supervision of pregnancy with other poor reproductive or obstetric history, unspecified trimester: Secondary | ICD-10-CM

## 2019-05-09 DIAGNOSIS — O24410 Gestational diabetes mellitus in pregnancy, diet controlled: Secondary | ICD-10-CM | POA: Diagnosis not present

## 2019-05-09 DIAGNOSIS — Z3483 Encounter for supervision of other normal pregnancy, third trimester: Secondary | ICD-10-CM

## 2019-05-09 DIAGNOSIS — O163 Unspecified maternal hypertension, third trimester: Secondary | ICD-10-CM

## 2019-05-09 DIAGNOSIS — L299 Pruritus, unspecified: Secondary | ICD-10-CM | POA: Diagnosis not present

## 2019-05-09 DIAGNOSIS — O09293 Supervision of pregnancy with other poor reproductive or obstetric history, third trimester: Secondary | ICD-10-CM

## 2019-05-09 DIAGNOSIS — Z3A38 38 weeks gestation of pregnancy: Secondary | ICD-10-CM

## 2019-05-09 LAB — POCT URINALYSIS DIPSTICK OB
Bilirubin, UA: NEGATIVE
Blood, UA: NEGATIVE
Glucose, UA: NEGATIVE
Ketones, UA: NEGATIVE
Nitrite, UA: NEGATIVE
POC,PROTEIN,UA: NEGATIVE
Spec Grav, UA: 1.01 (ref 1.010–1.025)
Urobilinogen, UA: 0.2 E.U./dL
pH, UA: 7.5 (ref 5.0–8.0)

## 2019-05-09 NOTE — Progress Notes (Signed)
ROB-Pt present for routine prenatal care and NST. Pt stated having swollen feet.

## 2019-05-09 NOTE — Patient Instructions (Signed)
COVID 19 Instructions for Scheduled Procedure (Inductions/C-sections and GYN surgeries)   Thank you for choosing [facility] for your services.  You have been scheduled for a procedure called __________Induction of Labor___________________________.    Your procedure is scheduled on ________________1/15/2021__________________________.  You are required to have COVID-19 testing performed 2 days prior to your scheduled procedure date.  Testing is performed between 9 and 10 AM Monday through Friday.  Please present for testing on _____1/13/2021___________________ during this hour. Drive up testing is performed in front of the [place] (this is next to the [employer]).    Upon your scheduled procedure date, you will need to arrive at the [place] entrance. (There is a statue at the front of this entrance.)   Please arrive on time if you are scheduled for an induction of labor.   If you are scheduled for a Cesarean delivery or for Gyn Surgery, arrive 2 hours prior to your procedure time.   If you are an Obstetric patient and your arrival time falls between 11 PM and 6 AM call L&D [phone]) when you arrive.  A staff member will meet you at the [place] entrance.  At this time, patients are allowed 1 support person to accompany them. Face masks are required for you and your support person. Your support person is now allowed to be there with you during the entire time of your admission.   Please contact the office if you have any questions regarding this information.  The [facility] office number is [phone].     Thank you,    Your [facility] Providers        Labor Induction  Labor induction is when steps are taken to cause a pregnant woman to begin the labor process. Most women go into labor on their own between 37 weeks and 42 weeks of pregnancy. When this does not happen or when there is a medical need for labor to begin, steps may be taken  to induce labor. Labor induction causes a pregnant woman's uterus to contract. It also causes the cervix to soften (ripen), open (dilate), and thin out (efface). Usually, labor is not induced before 39 weeks of pregnancy unless there is a medical reason to do so. Your health care provider will determine if labor induction is needed. Before inducing labor, your health care provider will consider a number of factors, including:  Your medical condition and your baby's.  How many weeks along you are in your pregnancy.  How mature your baby's lungs are.  The condition of your cervix.  The position of your baby.  The size of your birth canal. What are some reasons for labor induction? Labor may be induced if:  Your health or your baby's health is at risk.  Your pregnancy is overdue by 1 week or more.  Your water breaks but labor does not start on its own.  There is a low amount of amniotic fluid around your baby. You may also choose (elect) to have labor induced at a certain time. Generally, elective labor induction is done no earlier than 39 weeks of pregnancy. What methods are used for labor induction? Methods used for labor induction include:  Prostaglandin medicine. This medicine starts contractions and causes the cervix to dilate and ripen. It can be taken by mouth (orally) or by being inserted into the vagina (suppository).  Inserting a small, thin tube (catheter) with a balloon into the vagina and then expanding the  balloon with water to dilate the cervix.  Stripping the membranes. In this method, your health care provider gently separates amniotic sac tissue from the cervix. This causes the cervix to stretch, which in turn causes the release of a hormone called progesterone. The hormone causes the uterus to contract. This procedure is often done during an office visit, after which you will be sent home to wait for contractions to begin.  Breaking the water. In this method, your  health care provider uses a small instrument to make a small hole in the amniotic sac. This eventually causes the amniotic sac to break. Contractions should begin after a few hours.  Medicine to trigger or strengthen contractions. This medicine is given through an IV that is inserted into a vein in your arm. Except for membrane stripping, which can be done in a clinic, labor induction is done in the hospital so that you and your baby can be carefully monitored. How long does it take for labor to be induced? The length of time it takes to induce labor depends on how ready your body is for labor. Some inductions can take up to 2-3 days, while others may take less than a day. Induction may take longer if:  You are induced early in your pregnancy.  It is your first pregnancy.  Your cervix is not ready. What are some risks associated with labor induction? Some risks associated with labor induction include:  Changes in fetal heart rate, such as being too high, too low, or irregular (erratic).  Failed induction.  Infection in the mother or the baby.  Increased risk of having a cesarean delivery.  Fetal death.  Breaking off (abruption) of the placenta from the uterus (rare).  Rupture of the uterus (very rare). When induction is needed for medical reasons, the benefits of induction generally outweigh the risks. What are some reasons for not inducing labor? Labor induction should not be done if:  Your baby does not tolerate contractions.  You have had previous surgeries on your uterus, such as a myomectomy, removal of fibroids, or a vertical scar from a previous cesarean delivery.  Your placenta lies very low in your uterus and blocks the opening of the cervix (placenta previa).  Your baby is not in a head-down position.  The umbilical cord drops down into the birth canal in front of the baby.  There are unusual circumstances, such as the baby being very early (premature).  You have  had more than 2 previous cesarean deliveries. Summary  Labor induction is when steps are taken to cause a pregnant woman to begin the labor process.  Labor induction causes a pregnant woman's uterus to contract. It also causes the cervix to ripen, dilate, and efface.  Labor is not induced before 39 weeks of pregnancy unless there is a medical reason to do so.  When induction is needed for medical reasons, the benefits of induction generally outweigh the risks. This information is not intended to replace advice given to you by your health care provider. Make sure you discuss any questions you have with your health care provider. Document Revised: 04/22/2017 Document Reviewed: 06/02/2016 Elsevier Patient Education  2020 [employer].

## 2019-05-09 NOTE — Progress Notes (Signed)
  NONSTRESS TEST INTERPRETATION  INDICATIONS: GDM (diet)  FHR baseline: 130 bpm RESULTS:Reactive COMMENTS: No contractions.  BPs noted 137/89, 140/91, 138/86, 144/92   PLAN: 1. Continue fetal kick counts twice a day. 2. Continue antepartum testing as scheduled-weekly, Will schedule for induction at 39 weeks.

## 2019-05-09 NOTE — Progress Notes (Signed)
ROB: Patient complains of itching on palms, arms, and sides for several days. Denies trunk itching, recent skin irritants, exposures, or bites. Advised on use Benadryl, Aveeno as needed. Will check bile acids to r/o cholestasis. Also notes more foot swelling bilaterally. NST performed today was reviewed and was found to be reactive.  BPs on NST mildly elevated. Possibly gestational HTN developing at term, but In light of patient's prior history of pre-eclampsia, will order labs. Discussed that with newly elevated mild BPs, can consider IOL at 39 weeks. GDM still well controlled.

## 2019-05-10 LAB — COMPREHENSIVE METABOLIC PANEL
ALT: 12 IU/L (ref 0–32)
AST: 12 IU/L (ref 0–40)
Albumin/Globulin Ratio: 1.4 (ref 1.2–2.2)
Albumin: 3.5 g/dL — ABNORMAL LOW (ref 3.8–4.8)
Alkaline Phosphatase: 112 IU/L (ref 39–117)
BUN/Creatinine Ratio: 13 (ref 9–23)
BUN: 10 mg/dL (ref 6–20)
Bilirubin Total: <0.2 mg/dL (ref 0.0–1.2)
CO2: 19 mmol/L — ABNORMAL LOW (ref 20–29)
Calcium: 10.1 mg/dL (ref 8.7–10.2)
Chloride: 102 mmol/L (ref 96–106)
Creatinine, Ser: 0.8 mg/dL (ref 0.57–1.00)
GFR calc Af Amer: 114 mL/min/{1.73_m2} (ref >59)
GFR calc non Af Amer: 99 mL/min/{1.73_m2} (ref >59)
Globulin, Total: 2.5 g/dL (ref 1.5–4.5)
Glucose: 74 mg/dL (ref 65–99)
Potassium: 3.9 mmol/L (ref 3.5–5.2)
Sodium: 136 mmol/L (ref 134–144)
Total Protein: 6 g/dL (ref 6.0–8.5)

## 2019-05-10 LAB — CBC
Hematocrit: 27.8 % — ABNORMAL LOW (ref 34.0–46.6)
Hemoglobin: 9.7 g/dL — ABNORMAL LOW (ref 11.1–15.9)
MCH: 31.3 pg (ref 26.6–33.0)
MCHC: 34.9 g/dL (ref 31.5–35.7)
MCV: 90 fL (ref 79–97)
Platelets: 215 10*3/uL (ref 150–450)
RBC: 3.1 x10E6/uL — ABNORMAL LOW (ref 3.77–5.28)
RDW: 13.1 % (ref 11.7–15.4)
WBC: 9.1 10*3/uL (ref 3.4–10.8)

## 2019-05-10 LAB — PROTEIN / CREATININE RATIO, URINE
Creatinine, Urine: 96.6 mg/dL
Protein, Ur: 19.5 mg/dL
Protein/Creat Ratio: 202 mg/g creat — ABNORMAL HIGH (ref 0–200)

## 2019-05-10 LAB — BILE ACIDS, TOTAL: Bile Acids Total: 1.8 umol/L (ref 0.0–10.0)

## 2019-05-15 ENCOUNTER — Inpatient Hospital Stay
Admission: EM | Admit: 2019-05-15 | Discharge: 2019-05-16 | DRG: 807 | Disposition: A | Discharge status: Home / Self Care | Payer: Medicaid Other | Attending: Obstetrics and Gynecology | Admitting: Obstetrics and Gynecology

## 2019-05-15 ENCOUNTER — Encounter: Payer: Self-pay | Admitting: Obstetrics and Gynecology

## 2019-05-15 ENCOUNTER — Other Ambulatory Visit: Payer: Self-pay

## 2019-05-15 DIAGNOSIS — O24410 Gestational diabetes mellitus in pregnancy, diet controlled: Secondary | ICD-10-CM | POA: Diagnosis present

## 2019-05-15 DIAGNOSIS — Z20822 Contact with and (suspected) exposure to covid-19: Secondary | ICD-10-CM | POA: Diagnosis present

## 2019-05-15 DIAGNOSIS — O99214 Obesity complicating childbirth: Secondary | ICD-10-CM | POA: Diagnosis present

## 2019-05-15 DIAGNOSIS — E66811 Obesity, class 1: Secondary | ICD-10-CM | POA: Diagnosis present

## 2019-05-15 DIAGNOSIS — D649 Anemia, unspecified: Secondary | ICD-10-CM | POA: Diagnosis present

## 2019-05-15 DIAGNOSIS — O9902 Anemia complicating childbirth: Secondary | ICD-10-CM | POA: Diagnosis present

## 2019-05-15 DIAGNOSIS — O24420 Gestational diabetes mellitus in childbirth, diet controlled: Secondary | ICD-10-CM | POA: Diagnosis present

## 2019-05-15 DIAGNOSIS — Z8751 Personal history of pre-term labor: Secondary | ICD-10-CM

## 2019-05-15 DIAGNOSIS — O26893 Other specified pregnancy related conditions, third trimester: Secondary | ICD-10-CM | POA: Diagnosis present

## 2019-05-15 DIAGNOSIS — I10 Essential (primary) hypertension: Secondary | ICD-10-CM

## 2019-05-15 DIAGNOSIS — O99210 Obesity complicating pregnancy, unspecified trimester: Secondary | ICD-10-CM

## 2019-05-15 DIAGNOSIS — O133 Gestational [pregnancy-induced] hypertension without significant proteinuria, third trimester: Secondary | ICD-10-CM | POA: Diagnosis present

## 2019-05-15 DIAGNOSIS — O134 Gestational [pregnancy-induced] hypertension without significant proteinuria, complicating childbirth: Principal | ICD-10-CM | POA: Diagnosis present

## 2019-05-15 DIAGNOSIS — O09899 Supervision of other high risk pregnancies, unspecified trimester: Secondary | ICD-10-CM

## 2019-05-15 DIAGNOSIS — O24419 Gestational diabetes mellitus in pregnancy, unspecified control: Secondary | ICD-10-CM

## 2019-05-15 DIAGNOSIS — O99613 Diseases of the digestive system complicating pregnancy, third trimester: Secondary | ICD-10-CM | POA: Diagnosis present

## 2019-05-15 DIAGNOSIS — Z3A38 38 weeks gestation of pregnancy: Secondary | ICD-10-CM

## 2019-05-15 DIAGNOSIS — K5900 Constipation, unspecified: Secondary | ICD-10-CM | POA: Diagnosis present

## 2019-05-15 DIAGNOSIS — E669 Obesity, unspecified: Secondary | ICD-10-CM | POA: Diagnosis present

## 2019-05-15 DIAGNOSIS — O99013 Anemia complicating pregnancy, third trimester: Secondary | ICD-10-CM | POA: Diagnosis present

## 2019-05-15 DIAGNOSIS — O09299 Supervision of pregnancy with other poor reproductive or obstetric history, unspecified trimester: Secondary | ICD-10-CM

## 2019-05-15 LAB — COMPREHENSIVE METABOLIC PANEL
ALT: 17 U/L (ref 0–44)
AST: 21 U/L (ref 15–41)
Albumin: 2.7 g/dL — ABNORMAL LOW (ref 3.5–5.0)
Alkaline Phosphatase: 90 U/L (ref 38–126)
Anion gap: 8 (ref 5–15)
BUN: 12 mg/dL (ref 6–20)
CO2: 21 mmol/L — ABNORMAL LOW (ref 22–32)
Calcium: 10.6 mg/dL — ABNORMAL HIGH (ref 8.9–10.3)
Chloride: 107 mmol/L (ref 98–111)
Creatinine, Ser: 1.01 mg/dL — ABNORMAL HIGH (ref 0.44–1.00)
GFR calc Af Amer: >60 mL/min (ref >60)
GFR calc non Af Amer: >60 mL/min (ref >60)
Glucose, Bld: 110 mg/dL — ABNORMAL HIGH (ref 70–99)
Potassium: 3.2 mmol/L — ABNORMAL LOW (ref 3.5–5.1)
Sodium: 136 mmol/L (ref 135–145)
Total Bilirubin: 0.5 mg/dL (ref 0.3–1.2)
Total Protein: 6.8 g/dL (ref 6.5–8.1)

## 2019-05-15 LAB — CBC
HCT: 30.5 % — ABNORMAL LOW (ref 36.0–46.0)
Hemoglobin: 10.3 g/dL — ABNORMAL LOW (ref 12.0–15.0)
MCH: 31.1 pg (ref 26.0–34.0)
MCHC: 33.8 g/dL (ref 30.0–36.0)
MCV: 92.1 fL (ref 80.0–100.0)
Platelets: 145 10*3/uL — ABNORMAL LOW (ref 150–400)
RBC: 3.31 MIL/uL — ABNORMAL LOW (ref 3.87–5.11)
RDW: 13.7 % (ref 11.5–15.5)
WBC: 9.4 10*3/uL (ref 4.0–10.5)
nRBC: 0 % (ref 0.0–0.2)

## 2019-05-15 LAB — RUPTURE OF MEMBRANE (ROM)PLUS: Rom Plus: POSITIVE

## 2019-05-15 LAB — RESPIRATORY PANEL BY RT PCR (FLU A&B, COVID)
Influenza A by PCR: NEGATIVE
Influenza B by PCR: NEGATIVE
SARS Coronavirus 2 by RT PCR: NEGATIVE

## 2019-05-15 LAB — TYPE AND SCREEN
ABO/RH(D): O POS
Antibody Screen: NEGATIVE

## 2019-05-15 MED ORDER — AMMONIA AROMATIC IN INHA
RESPIRATORY_TRACT | Status: AC
  Filled 2019-05-15: qty 10

## 2019-05-15 MED ORDER — OXYTOCIN 10 UNIT/ML IJ SOLN
INTRAMUSCULAR | Status: AC
  Filled 2019-05-15: qty 2

## 2019-05-15 MED ORDER — LIDOCAINE HCL (PF) 1 % IJ SOLN
INTRAMUSCULAR | Status: AC
  Filled 2019-05-15: qty 30

## 2019-05-15 MED ORDER — OXYCODONE-ACETAMINOPHEN 5-325 MG PO TABS: 2.0000 | ORAL_TABLET | ORAL | Status: DC | PRN

## 2019-05-15 MED ORDER — PRENATAL MULTIVITAMIN CH: 1.0000 | ORAL_TABLET | Freq: Every day | ORAL | Status: DC

## 2019-05-15 MED ORDER — OXYTOCIN BOLUS FROM INFUSION
500.0000 mL | Freq: Once | INTRAVENOUS | Status: AC
  Administered 2019-05-15: 07:00:00 500 mL via INTRAVENOUS

## 2019-05-15 MED ORDER — BUTORPHANOL TARTRATE 1 MG/ML IJ SOLN
1.0000 mg | INTRAMUSCULAR | Status: DC | PRN
  Administered 2019-05-15: 1 mg via INTRAVENOUS
  Filled 2019-05-15: qty 1

## 2019-05-15 MED ORDER — COCONUT OIL OIL
1.0000 "application " | TOPICAL_OIL | Status: DC | PRN
  Administered 2019-05-15: 1 via TOPICAL
  Filled 2019-05-15: qty 120

## 2019-05-15 MED ORDER — IBUPROFEN 800 MG PO TABS: 800.0000 mg | ORAL_TABLET | Freq: Three times a day (TID) | ORAL | 1 refills | Status: DC | PRN

## 2019-05-15 MED ORDER — OXYCODONE-ACETAMINOPHEN 5-325 MG PO TABS: 1.0000 | ORAL_TABLET | ORAL | Status: DC | PRN

## 2019-05-15 MED ORDER — LABETALOL HCL 5 MG/ML IV SOLN: 20.0000 mg | INTRAVENOUS | Status: DC | PRN

## 2019-05-15 MED ORDER — LABETALOL HCL 5 MG/ML IV SOLN: 40.0000 mg | INTRAVENOUS | Status: DC | PRN

## 2019-05-15 MED ORDER — TERBUTALINE SULFATE 1 MG/ML IJ SOLN: 0.2500 mg | Freq: Once | INTRAMUSCULAR | Status: DC | PRN

## 2019-05-15 MED ORDER — HYDRALAZINE HCL 20 MG/ML IJ SOLN: 10.0000 mg | INTRAMUSCULAR | Status: DC | PRN

## 2019-05-15 MED ORDER — LIDOCAINE HCL (PF) 1 % IJ SOLN: 30.0000 mL | INTRAMUSCULAR | Status: DC | PRN

## 2019-05-15 MED ORDER — SODIUM CHLORIDE 0.9 % IV SOLN
75.0000 mg | Freq: Once | INTRAVENOUS | Status: AC
  Administered 2019-05-15: 75 mg via INTRAVENOUS
  Filled 2019-05-15: qty 1.5

## 2019-05-15 MED ORDER — ACETAMINOPHEN 325 MG PO TABS: 650.0000 mg | ORAL_TABLET | ORAL | Status: DC | PRN

## 2019-05-15 MED ORDER — MISOPROSTOL 50MCG HALF TABLET: 50.0000 ug | ORAL_TABLET | ORAL | Status: DC | PRN

## 2019-05-15 MED ORDER — MISOPROSTOL 200 MCG PO TABS
ORAL_TABLET | ORAL | Status: AC
  Filled 2019-05-15: qty 4

## 2019-05-15 MED ORDER — ONDANSETRON HCL 4 MG/2ML IJ SOLN: 4.0000 mg | INTRAMUSCULAR | Status: DC | PRN

## 2019-05-15 MED ORDER — ONDANSETRON HCL 4 MG PO TABS: 4.0000 mg | ORAL_TABLET | ORAL | Status: DC | PRN

## 2019-05-15 MED ORDER — ONDANSETRON HCL 4 MG/2ML IJ SOLN: 4.0000 mg | Freq: Four times a day (QID) | INTRAMUSCULAR | Status: DC | PRN

## 2019-05-15 MED ORDER — DIPHENHYDRAMINE HCL 25 MG PO CAPS: 25.0000 mg | ORAL_CAPSULE | Freq: Four times a day (QID) | ORAL | Status: DC | PRN

## 2019-05-15 MED ORDER — OXYTOCIN 40 UNITS IN NORMAL SALINE INFUSION - SIMPLE MED
2.5000 [IU]/h | INTRAVENOUS | Status: DC
  Administered 2019-05-15: 08:00:00 2.5 [IU]/h via INTRAVENOUS
  Filled 2019-05-15: qty 1000

## 2019-05-15 MED ORDER — LACTATED RINGERS IV SOLN: INTRAVENOUS | Status: DC

## 2019-05-15 MED ORDER — SODIUM CHLORIDE 0.9 % IV SOLN
25.0000 mg | Freq: Once | INTRAVENOUS | Status: AC
  Administered 2019-05-15: 25 mg via INTRAVENOUS
  Filled 2019-05-15: qty 0.5

## 2019-05-15 MED ORDER — SIMETHICONE 80 MG PO CHEW: 80.0000 mg | CHEWABLE_TABLET | ORAL | Status: DC | PRN

## 2019-05-15 MED ORDER — SOD CITRATE-CITRIC ACID 500-334 MG/5ML PO SOLN: 30.0000 mL | ORAL | Status: DC | PRN

## 2019-05-15 MED ORDER — IRON DEXTRAN 50 MG/ML IJ SOLN: 100.0000 mg | Freq: Every day | INTRAMUSCULAR | Status: DC

## 2019-05-15 MED ORDER — IBUPROFEN 800 MG PO TABS
800.0000 mg | ORAL_TABLET | Freq: Four times a day (QID) | ORAL | Status: DC
  Administered 2019-05-16 (×2): 800 mg via ORAL
  Filled 2019-05-15 (×2): qty 1

## 2019-05-15 MED ORDER — WITCH HAZEL-GLYCERIN EX PADS: 1.0000 "application " | MEDICATED_PAD | CUTANEOUS | Status: DC | PRN

## 2019-05-15 MED ORDER — BENZOCAINE-MENTHOL 20-0.5 % EX AERO
1.0000 "application " | INHALATION_SPRAY | CUTANEOUS | Status: DC | PRN
  Administered 2019-05-15: 1 via TOPICAL
  Filled 2019-05-15: qty 56

## 2019-05-15 MED ORDER — IBUPROFEN 800 MG PO TABS
800.0000 mg | ORAL_TABLET | Freq: Four times a day (QID) | ORAL | Status: DC
  Administered 2019-05-15 (×3): 800 mg via ORAL
  Filled 2019-05-15 (×3): qty 1

## 2019-05-15 MED ORDER — DIBUCAINE (PERIANAL) 1 % EX OINT: 1.0000 "application " | TOPICAL_OINTMENT | CUTANEOUS | Status: DC | PRN

## 2019-05-15 MED ORDER — DOCUSATE SODIUM 100 MG PO CAPS
100.0000 mg | ORAL_CAPSULE | Freq: Two times a day (BID) | ORAL | Status: DC
  Administered 2019-05-15 – 2019-05-16 (×2): 100 mg via ORAL
  Filled 2019-05-15 (×2): qty 1

## 2019-05-15 MED ORDER — LABETALOL HCL 5 MG/ML IV SOLN: 80.0000 mg | INTRAVENOUS | Status: DC | PRN

## 2019-05-15 MED ORDER — LACTATED RINGERS IV SOLN: 500.0000 mL | INTRAVENOUS | Status: DC | PRN

## 2019-05-15 NOTE — Plan of Care (Signed)
SVD

## 2019-05-15 NOTE — Lactation Note (Signed)
This note was copied from a baby's chart. Lactation Consultation Note  Patient Name: Jillian Anderson [id number] Date: 05/15/2019   MOB holding Baby when Student entered room. Student provided education regarding feeding cues, cluster feeding, the benefits of skin to skin, the role of frequent milk removal (supply and demand), hand expression.  MOB inquired about a pump. Student informed MOB that pump assistance is available, per her request. FOB confirmed that they have a DEBP at home.   MOB reported baby sleeping at breast. Student reviewed normal newborn behavior education.   Lactation contact provided, encouraged to call with baby's next feeding or if assistance is needed.   Maternal Data    Feeding Feeding Type: Breast Fed  [facility] Score                   Interventions    Lactation Tools Discussed/Used     Consult Status      Danford Bad 05/15/2019, 2:20 PM

## 2019-05-15 NOTE — H&P (Signed)
Obstetric History and Physical  Jillian Anderson is a 34 y.o. [phone] with IUP at [redacted]w[redacted]d presenting for complaints of contractions and possible LOF. Patient states she has been having  regular, every 3 minutes contractions, none vaginal bleeding, ruptured membranes, with active fetal movement.    Prenatal Course Source of Care: [facility] with onset of care at 7 weeks  Pregnancy complications or risks: Patient Active Problem List   Diagnosis Date Noted  . Gestational hypertension without significant proteinuria in third trimester 05/15/2019  . Diet controlled gestational diabetes mellitus (GDM) in third trimester 04/03/2019  . Constipation in pregnancy in third trimester 03/06/2019  . Anemia of pregnancy in third trimester 03/06/2019  . Short interval between pregnancies complicating pregnancy, antepartum 11/11/2018  . History of preterm delivery 11/11/2018  . History of intrauterine growth restriction in prior pregnancy, currently pregnant 11/11/2018  . History of pre-eclampsia in prior pregnancy, currently pregnant 10/10/2018  . Obesity in pregnancy 10/10/2018  . History of miscarriage, currently pregnant 04/20/2017  . Ear pain 06/04/2016  . Hyperlipidemia 06/04/2016  . Polycystic ovarian syndrome 03/15/2016  . Obesity (BMI 30.0-34.9) 03/15/2016  . Mild intermittent asthma without complication 03/15/2016  . Folliculitis 09/12/2015  . Dermatitis 09/12/2015  . GERD (gastroesophageal reflux disease) 08/01/2013  . Dysmenorrhea 09/22/2012  . Nephrolithiasis 09/03/2011  . Palpitations 07/08/2011  . IBS (irritable bowel syndrome) 07/08/2011  . Migraines 01/06/2011  . Allergic rhinitis 01/06/2011   She plans to breastfeed She desires IUD for postpartum contraception.   Prenatal labs and studies: ABO, Rh: O/Positive/-- (06/09 1527) Antibody: Negative (06/09 1527) Rubella: 4.75 (06/09 1527) RPR: Non Reactive (10/28 0930)  HBsAg: Negative (06/09 1527)  HIV: Non  Reactive (06/09 1527)  GBS:--/Negative (12/24 1131) 1 hr Glucola  Abnormal, with 3 hr abnormal Genetic screening normal Anatomy US normal   Past Medical History:  Diagnosis Date  . Allergy   . Asthma without status asthmaticus 03/15/2016  . Astigmatism   . GERD (gastroesophageal reflux disease)   . Gestational diabetes   . History of kidney stones    In the past  . IUGR (intrauterine growth restriction) affecting care of mother 11/03/2017  . Migraines   . PCOS (polycystic ovarian syndrome)   . Polycystic disease, ovaries   . Pre-eclampsia affecting pregnancy, antepartum 11/01/2017  . Tachycardia   . Tachycardia     Past Surgical History:  Procedure Laterality Date  . DILATION AND EVACUATION N/A 04/23/2016   Procedure: DILATATION AND EVACUATION;  Surgeon: Herold Harms, MD;  Location: [facility] ORS;  Service: Gynecology;  Laterality: N/A;  . WISDOM TOOTH EXTRACTION      OB History  Gravida Para Term Preterm AB Living  3 2 1 1 1 2   SAB TAB Ectopic Multiple Live Births  1     0 2    # Outcome Date GA Lbr Len/2nd Weight Sex Delivery Anes PTL Lv  3 Term 05/15/19 [redacted]w[redacted]d   M Vag-Spont None  LIV  2 Preterm 11/05/17 [redacted]w[redacted]d / 00:07 2120 g M Vag-Spont Local  LIV  1 SAB             Social History   Socioeconomic History  . Marital status: Married    Spouse name: Not on file  . Number of children: Not on file  . Years of education: Not on file  . Highest education level: Not on file  Occupational History  . Not on file  Tobacco Use  . Smoking status: Never Smoker  .  Smokeless tobacco: Never Used  Substance and Sexual Activity  . Alcohol use: Not Currently    Alcohol/week: 1.0 standard drinks    Types: 1 Glasses of wine per week    Comment: occassionally - not in pregnancy  . Drug use: No  . Sexual activity: Yes    Birth control/protection: None  Other Topics Concern  . Not on file  Social History Narrative  . Not on file   Social Determinants of Health    Financial Resource Strain:   . Difficulty of Paying Living Expenses: Not on file  Food Insecurity:   . Worried About Charity fundraiser in the Last Year: Not on file  . Ran Out of Food in the Last Year: Not on file  Transportation Needs:   . Lack of Transportation (Medical): Not on file  . Lack of Transportation (Non-Medical): Not on file  Physical Activity:   . Days of Exercise per Week: Not on file  . Minutes of Exercise per Session: Not on file  Stress:   . Feeling of Stress : Not on file  Social Connections:   . Frequency of Communication with Friends and Family: Not on file  . Frequency of Social Gatherings with Friends and Family: Not on file  . Attends Religious Services: Not on file  . Active Member of Clubs or Organizations: Not on file  . Attends Archivist Meetings: Not on file  . Marital Status: Not on file    Family History  Problem Relation Age of Onset  . Hypertension Mother   . Hyperlipidemia Father   . Diabetes Father   . Asthma Brother   . Breast cancer Neg Hx   . Ovarian cancer Neg Hx   . Colon cancer Neg Hx     Medications Prior to Admission  Medication Sig Dispense Refill Last Dose  . aspirin EC 81 MG tablet Take 1 tablet (81 mg total) by mouth daily. Take after 12 weeks for prevention of preeclampssia later in pregnancy 300 tablet 2 05/14/2019 at Unknown time  . docusate sodium (COLACE) 100 MG capsule Take 100 mg by mouth 2 (two) times daily.   05/14/2019 at Unknown time  . ferrous gluconate (FERGON) 324 MG tablet Take 1 tablet (324 mg total) by mouth 2 (two) times daily with a meal. 60 tablet 3 05/14/2019 at Unknown time  . glucose blood (ACCU-CHEK GUIDE) test strip Use as instructed 100 each 12 05/14/2019 at Unknown time  . Prenatal Vit-Fe Fumarate-FA (PRENATAL MULTIVITAMIN) TABS tablet Take 1 tablet by mouth daily at 12 noon.   05/14/2019 at Unknown time  . Accu-Chek FastClix Lancets MISC 1 Device by Percutaneous route 4 (four) times daily.  Check fasting and 2 hours after each meal. 100 each 12   . loratadine (CLARITIN) 10 MG tablet Take 10 mg by mouth daily.    Not Taking at Unknown time    Allergies  Allergen Reactions  . Ceclor [Cefaclor]     hives  . Codeine     Body fights itself eyes dilate  . Red Dye     Hives, nausea and migraines   . Blackberry Flavor Nausea And Vomiting and Rash  . Raspberry Nausea And Vomiting and Rash    Review of Systems: Negative except for what is mentioned in HPI.  Physical Exam: Vitals:   05/15/19 0600 05/15/19 0614 05/15/19 0630 05/15/19 0645  BP: (!) 146/93 (!) 138/97 (!) 151/89 127/84  Pulse: (!) 106 (!) 101 94 92  Resp:      Temp:      Weight:      Height:       CONSTITUTIONAL: Well-developed, well-nourished female in no acute distress.  HENT:  Normocephalic, atraumatic, External right and left ear normal. Oropharynx is clear and moist EYES: Conjunctivae and EOM are normal. Pupils are equal, round, and reactive to light. No scleral icterus.  NECK: Normal range of motion, supple, no masses SKIN: Skin is warm and dry. No rash noted. Not diaphoretic. No erythema. No pallor. NEUROLOGIC: Alert and oriented to person, place, and time. Normal reflexes, muscle tone coordination. No cranial nerve deficit noted. PSYCHIATRIC: Normal mood and affect. Normal behavior. Normal judgment and thought content. CARDIOVASCULAR: Normal heart rate noted, regular rhythm RESPIRATORY: Effort and breath sounds normal, no problems with respiration noted ABDOMEN: Soft, nontender, nondistended, gravid. MUSCULOSKELETAL: Normal range of motion. No edema and no tenderness. 2+ distal pulses.  Cervical Exam: Dilatation 10cm   Effacement 100%   Station +1 , ruptured membranes, clear Presentation: cephalic FHT:  Baseline rate 125 bpm   Variability moderate  Accelerations present   Decelerations none Contractions: Every 2 mins   Pertinent Labs/Studies:   Results for orders placed or performed during the  hospital encounter of 05/15/19 (from the past 24 hour(s))  ROM Plus ([facility] only)     Status: None   Collection Time: 05/15/19  6:06 AM  Result Value Ref Range   Rom Plus POSITIVE   CBC     Status: Abnormal   Collection Time: 05/15/19  6:46 AM  Result Value Ref Range   WBC 9.4 4.0 - 10.5 K/uL   RBC 3.31 (L) 3.87 - 5.11 MIL/uL   Hemoglobin 10.3 (L) 12.0 - 15.0 g/dL   HCT 65.6 (L) 81.2 - 75.1 %   MCV 92.1 80.0 - 100.0 fL   MCH 31.1 26.0 - 34.0 pg   MCHC 33.8 30.0 - 36.0 g/dL   RDW 70.0 17.4 - 94.4 %   Platelets 145 (L) 150 - 400 K/uL   nRBC 0.0 0.0 - 0.2 %    Assessment : Jillian Anderson is a 34 y.o. [id number] at [redacted]w[redacted]d being admitted for active labor, GDM (diet controlled), GHTN.   Plan: Labor: Expectant management. Admission labs ordered. Analgesia as needed. Monitor BPs per protocol. Repeat PIH labs ordered. Labetalol as needed for severe range BPs.  FWB: Reassuring fetal heart tracing.  GBS negative. Delivery plan: vaginal delivery    Hildred Laser, MD [facility]

## 2019-05-15 NOTE — Lactation Note (Signed)
This note was copied from a baby's chart. Lactation Consultation Note  Patient Name: Jillian Anderson [id number] Date: 05/15/2019 Reason for consult: Initial assessment  LC and LC student in to see mom and baby Anette Riedel shortly after delivery. Mom and transition RN report first feedings to have been going well. Mom does have prior breastfeeding experience with a preemie, reporting low milk supply, and only breastfeeding for a short period of time. This pregnancy mom did experience GDM, diet controlled, and baby's sugar levels have been above minimum expectations, 51 and 51.  Provided basic breastfeeding education, and encouraged to seek breastfeeding support while inpatient. LC and LC student will follow-up with mom later today.  Maternal Data Formula Feeding for Exclusion: No Has patient been taught Hand Expression?: Yes Does the patient have breastfeeding experience prior to this delivery?: Yes  Feeding Feeding Type: Breast Fed  LATCH Score Latch: Repeated attempts needed to sustain latch, nipple held in mouth throughout feeding, stimulation needed to elicit sucking reflex.  Audible Swallowing: A few with stimulation  Type of Nipple: Everted at rest and after stimulation  Comfort (Breast/Nipple): Soft / non-tender  Hold (Positioning): No assistance needed to correctly position infant at breast.  LATCH Score: 8  Interventions Interventions: Breast feeding basics reviewed  Lactation Tools Discussed/Used     Consult Status Consult Status: Follow-up Date: 05/15/19 Follow-up type: In-patient    Danford Bad 05/15/2019, 9:20 AM

## 2019-05-15 NOTE — OB Triage Note (Signed)
Patient came in for observation for labor evaluation. Patient reports regular uterine contractions every three to four minutes since 0500 when she thinks her water broke. Patient reports +FM. Patient reports leaking of clear fluid but denies vaginal bleeding and spotting. Vital signs stable and patient afebrile. FHR baseline 120with moderate variability with accelerations 15 x 15 and no decelerations. Significant other at bedside. Will continue to monitor.

## 2019-05-16 ENCOUNTER — Other Ambulatory Visit: Payer: Medicaid Other

## 2019-05-16 ENCOUNTER — Encounter: Payer: Medicaid Other | Admitting: Obstetrics and Gynecology

## 2019-05-16 LAB — CBC
HCT: 27.5 % — ABNORMAL LOW (ref 36.0–46.0)
Hemoglobin: 8.9 g/dL — ABNORMAL LOW (ref 12.0–15.0)
MCH: 30.9 pg (ref 26.0–34.0)
MCHC: 32.4 g/dL (ref 30.0–36.0)
MCV: 95.5 fL (ref 80.0–100.0)
Platelets: 171 10*3/uL (ref 150–400)
RBC: 2.88 MIL/uL — ABNORMAL LOW (ref 3.87–5.11)
RDW: 13.8 % (ref 11.5–15.5)
WBC: 12.1 10*3/uL — ABNORMAL HIGH (ref 4.0–10.5)
nRBC: 0 % (ref 0.0–0.2)

## 2019-05-16 LAB — GLUCOSE, CAPILLARY: Glucose-Capillary: 79 mg/dL (ref 70–99)

## 2019-05-16 LAB — RPR: RPR Ser Ql: NONREACTIVE

## 2019-05-16 NOTE — Progress Notes (Signed)
Dc to home with NB.  To car via nursing staff

## 2019-05-16 NOTE — Discharge Summary (Signed)
OB Discharge Summary     Patient Name: Jillian Anderson DOB: 1985/08/11 MRN: [id number]  Date of admission: 05/15/2019 Delivering MD: Hildred Laser   Date of discharge: 05/16/2019  Admitting diagnosis: Gestational hypertension without significant proteinuria in third trimester [O13.3] Intrauterine pregnancy: [redacted]w[redacted]d     Secondary diagnosis:  Active Problems:   Obesity (BMI 30.0-34.9)   History of pre-eclampsia in prior pregnancy, currently pregnant   Short interval between pregnancies complicating pregnancy, antepartum   Constipation in pregnancy in third trimester   Anemia of pregnancy in third trimester   Diet controlled gestational diabetes mellitus (GDM) in third trimester   Gestational hypertension without significant proteinuria in third trimester  Additional problems: None     Discharge diagnosis: Term Pregnancy Delivered, Gestational Hypertension, GDM A1 and Anemia                                                                                                Post partum procedures:Iron infusion  Augmentation: None  Complications: None  Hospital course:  Onset of Labor With Vaginal Delivery     34 y.o. yo [id number] at [redacted]w[redacted]d was admitted in Active Labor on 05/15/2019. Patient had an uncomplicated labor course as follows:  Membrane Rupture Time/Date: 5:00 AM ,05/15/2019   Intrapartum Procedures: Episiotomy: None [1]                                         Lacerations:  None [1]  Patient had a delivery of a Viable infant "Noah". 05/15/2019  Delivery Summary for Candy Sledge  Labor Events:   Preterm labor: No data found  Rupture date: 05/15/2019  Rupture time: 5:00 AM  Rupture type: Spontaneous Possible ROM - for evaluation  Fluid Color: Clear  Induction: No data found  Augmentation: No data found  Complications: No data found  Cervical ripening: No data found No data found   No data found     Delivery:   Episiotomy: No data found  Lacerations: No data  found  Repair suture: No data found  Repair # of packets: No data found  Blood loss (ml): 250 ml   Information for the patient's newborn:  Deeksha, Cotrell [[id number]]    Delivery 05/15/2019 7:02 AM by  Vaginal, Spontaneous Sex:  female Gestational Age: [redacted]w[redacted]d Delivery Clinician:   Living?:         APGARS  One minute Five minutes Ten minutes  Skin color:        Heart rate:        Grimace:        Muscle tone:        Breathing:        Totals: 8  9      Presentation/position:      Resuscitation:   Cord information:    Disposition of cord blood:     Blood gases sent?  Complications:   Placenta: Delivered:       appearance Newborn Measurements: Weight: 6 lb 8.4 oz (2960  g)  Height: 20.08"  Head circumference:    Chest circumference:    Other providers:    Additional  information: Forceps:   Vacuum:   Breech:   Observed anomalies       Pateint had an uncomplicated postpartum course.  She is ambulating, tolerating a regular diet, passing flatus, and urinating well. Patient is discharged home in stable condition on 05/16/19.   Physical exam  Vitals:   05/15/19 1604 05/15/19 1911 05/15/19 2300 05/16/19 0825  BP: 124/79 125/85 124/80 128/88  Pulse: 81 90 82 79  Resp: 20 20 20 18   Temp: 98 F (36.7 C) 97.8 F (36.6 C) 97.8 F (36.6 C) 97.8 F (36.6 C)  TempSrc: Axillary Oral  Axillary  SpO2:  97% 100% 99%  Weight:      Height:       General: alert, cooperative and no distress Lochia: appropriate Uterine Fundus: firm Incision: N/A DVT Evaluation: No evidence of DVT seen on physical exam. Negative Homan's sign. No cords or calf tenderness. No significant calf/ankle edema.   Labs: Lab Results  Component Value Date   WBC 12.1 (H) 05/16/2019   HGB 8.9 (L) 05/16/2019   HCT 27.5 (L) 05/16/2019   MCV 95.5 05/16/2019   PLT 171 05/16/2019   CMP Latest Ref Rng & Units 05/15/2019  Glucose 70 - 99 mg/dL 110(H)  BUN 6 - 20 mg/dL 12  Creatinine 0.44 - 1.00 mg/dL  1.01(H)  Sodium 135 - 145 mmol/L 136  Potassium 3.5 - 5.1 mmol/L 3.2(L)  Chloride 98 - 111 mmol/L 107  CO2 22 - 32 mmol/L 21(L)  Calcium 8.9 - 10.3 mg/dL 10.6(H)  Total Protein 6.5 - 8.1 g/dL 6.8  Total Bilirubin 0.3 - 1.2 mg/dL 0.5  Alkaline Phos 38 - 126 U/L 90  AST 15 - 41 U/L 21  ALT 0 - 44 U/L 17    Discharge instruction: per After Visit Summary and "Baby and Me Booklet".  After visit meds:  Allergies as of 05/16/2019      Reactions   Ceclor [cefaclor]    hives   Codeine    Body fights itself eyes dilate   Red Dye    Hives, nausea and migraines   Blackberry Flavor Nausea And Vomiting, Rash   Raspberry Nausea And Vomiting, Rash      Medication List    STOP taking these medications   Accu-Chek FastClix Lancets Misc   Accu-Chek Guide test strip Generic drug: glucose blood   aspirin EC 81 MG tablet     TAKE these medications   Claritin 10 MG tablet Generic drug: loratadine Take 10 mg by mouth daily.   docusate sodium 100 MG capsule Commonly known as: COLACE Take 100 mg by mouth 2 (two) times daily.   ferrous gluconate 324 MG tablet Commonly known as: FERGON Take 1 tablet (324 mg total) by mouth 2 (two) times daily with a meal.   ibuprofen 800 MG tablet Commonly known as: ADVIL Take 1 tablet (800 mg total) by mouth every 8 (eight) hours as needed.   prenatal multivitamin Tabs tablet Take 1 tablet by mouth daily at 12 noon.       Diet: routine diet  Activity: Advance as tolerated. Pelvic rest for 6 weeks.   Outpatient follow up:1 week for BP check. 6 weeks postpartum check Follow up Appt: Future Appointments  Date Time Provider [place]  06/26/2019 10:30 AM Harlin Heys, MD [facility] None   Follow up Visit:No follow-ups on  file.  Postpartum contraception: IUD Mirena  Newborn Data: Live born female  Birth Weight: 6 lb 8.4 oz (2960 g) APGAR: 8, 9  Newborn Delivery   Birth date/time: 05/15/2019 07:02:00 Delivery type: Vaginal,  Spontaneous      Baby Feeding: Breast Disposition:home with mother   05/16/2019 Hildred Laser, MD  [facility]  .

## 2019-05-16 NOTE — Lactation Note (Signed)
This note was copied from a baby's chart. Lactation Consultation Note  Patient Name: Jillian Anderson [id number] Date: 05/16/2019 Reason for consult: Follow-up assessment  LC in to see mom and baby Anette Riedel before discharge. Parents report feedings to have gone well overnight, cluster feeding this morning. No pain or discomfort reported, mom alternating breasts at each feed. Parents asked for guidance for insurance of building and maintaining milk supply. LC provided education on milk supply and demand, feedings on cue, use of EBP if desired to post feeds 1-2x/day. Reviewed normal course of lactation, breast fullness and management, adequate emptying of breast, signs of adequate milk transfer, wet/stool diapers. Information given for outpatient lactation consult services and community breastfeeding support groups. Encouraged to call out today with questions or for guidance as needed before leaving.  Maternal Data Formula Feeding for Exclusion: No Has patient been taught Hand Expression?: Yes Does the patient have breastfeeding experience prior to this delivery?: Yes  Feeding    LATCH Score                   Interventions Interventions: Breast feeding basics reviewed;Coconut oil  Lactation Tools Discussed/Used     Consult Status Consult Status: Complete Date: 05/16/19 Follow-up type: Call as needed    Danford Bad 05/16/2019, 9:43 AM

## 2019-05-16 NOTE — Progress Notes (Signed)
Post Partum Day # 1, s/p SVD. GDM (diet).  Gestational HTN.  Subjective: no complaints, up ad lib, voiding and tolerating PO.  Objective: Temp:  [97.8 F (36.6 C)-98.6 F (37 C)] 97.8 F (36.6 C) (01/12 2300) Pulse Rate:  [62-90] 82 (01/12 2300) Resp:  [18-20] 20 (01/12 2300) BP: (117-125)/(72-85) 124/80 (01/12 2300) SpO2:  [97 %-100 %] 100 % (01/12 2300)  Physical Exam:  General: alert and no distress  Lungs: clear to auscultation bilaterally Breasts: normal appearance, no masses or tenderness Heart: regular rate and rhythm, S1, S2 normal, no murmur, click, rub or gallop Abdomen: soft, non-tender; bowel sounds normal; no masses,  no organomegaly Pelvis: Lochia: appropriate, Uterine Fundus: firm Extremities: DVT Evaluation: No evidence of DVT seen on physical exam. Negative Homan's sign. No cords or calf tenderness. No significant calf/ankle edema.  Recent Labs    05/15/19 0646 05/16/19 0542  HGB 10.3* 8.9*  HCT 30.5* 27.5*    Results for LESTA, LIMBERT (MRN [id number]) as of 05/16/2019 08:19  Ref. Range 05/15/2019 07:39  Glucose Latest Ref Range: 70 - 99 mg/dL 263 (H)    Assessment/Plan: Doing well postpartum.  Breastfeeding and Lactation consult.  Anemia of pregnancy, given IV iron infusion yesterday (patient does not tolerate PO iron well). GDM, diet controlled.  Fasting glucose mildly elevated. No intervention required at this time.  Gestational HTN, BPs wnl postpartum.  Has h/o pre-eclampsia in prior pregnancy.  Will have patient f/u in 1 week postpartum once discharged for BP monitoring.  Can d/c home today if baby able to be discharged.       LOS: 1 day   Hildred Laser, MD [facility] 05/16/2019 8:13 AM

## 2019-05-16 NOTE — Progress Notes (Signed)
Dc inst given.  Pt verb u/o

## 2019-05-23 ENCOUNTER — Other Ambulatory Visit: Payer: Self-pay

## 2019-05-23 ENCOUNTER — Ambulatory Visit (INDEPENDENT_AMBULATORY_CARE_PROVIDER_SITE_OTHER): Payer: Medicaid Other | Admitting: Obstetrics and Gynecology

## 2019-05-23 VITALS — BP 118/86 | HR 73 | Ht 62.0 in | Wt 168.2 lb

## 2019-05-23 DIAGNOSIS — O139 Gestational [pregnancy-induced] hypertension without significant proteinuria, unspecified trimester: Secondary | ICD-10-CM

## 2019-05-23 DIAGNOSIS — Z8759 Personal history of other complications of pregnancy, childbirth and the puerperium: Secondary | ICD-10-CM

## 2019-05-23 NOTE — Progress Notes (Signed)
Patient with gestational HTN at term in pregnancy with history of pre-eclampsia in prior pregnancy, for 1 week BP check postpartum. BPs mildly elevated, no need for medication at this time. Can f/u at 6 week appointment.    Hildred Laser, MD [facility]

## 2019-05-23 NOTE — Progress Notes (Signed)
Patient here for post-partum BP check.  Patient denies HA's, visual changes or swelling.    BP (!) 128/91   Pulse 73   Ht 5\' 2"  (1.575 m)   Wt 168 lb 3.2 oz (76.3 kg)   LMP 08/16/2018   Breastfeeding Yes   BMI 30.76 kg/m   Repeat BP was 118/86

## 2019-06-26 ENCOUNTER — Encounter: Payer: Self-pay | Admitting: Obstetrics and Gynecology

## 2019-06-26 ENCOUNTER — Other Ambulatory Visit: Payer: Self-pay

## 2019-06-26 ENCOUNTER — Ambulatory Visit (INDEPENDENT_AMBULATORY_CARE_PROVIDER_SITE_OTHER): Payer: Medicaid Other | Admitting: Obstetrics and Gynecology

## 2019-06-26 DIAGNOSIS — Z8632 Personal history of gestational diabetes: Secondary | ICD-10-CM

## 2019-06-26 DIAGNOSIS — Z3202 Encounter for pregnancy test, result negative: Secondary | ICD-10-CM | POA: Diagnosis not present

## 2019-06-26 DIAGNOSIS — Z30014 Encounter for initial prescription of intrauterine contraceptive device: Secondary | ICD-10-CM | POA: Diagnosis not present

## 2019-06-26 DIAGNOSIS — O9081 Anemia of the puerperium: Secondary | ICD-10-CM

## 2019-06-26 LAB — POCT URINE PREGNANCY: Preg Test, Ur: NEGATIVE

## 2019-06-26 NOTE — Progress Notes (Signed)
  PT is present today for her postpartum visit. Pt stated that she is breastfeeding and have not had sexually intercourse recently. Pt stated that she would like to get an IUD for birth control. EPDS=4 .  UPT- NEG. Pt stated that she is doing well no complaints.

## 2019-06-26 NOTE — Progress Notes (Addendum)
OBSTETRICS POSTPARTUM CLINIC PROGRESS NOTE  Subjective:     Jillian Anderson is a 34 y.o. [phone] female who presents for a postpartum visit. She is 6 weeks postpartum following a spontaneous vaginal delivery. I have fully reviewed the prenatal and intrapartum course. Pregnancy was complicated by gestational diabetes (diet) and short interval between pregnancies. Also with prior h/o IUGR and pre-eclampsia in prior pregnancy. The delivery was at 38.6 gestational weeks.  Anesthesia: none (due to precipitous delivery). Postpartum course has been well. Baby's course has been well. Baby is feeding by breast. Bleeding: patient has not resumed menses. Bowel function is normal. Bladder function is normal. Patient is not sexually active. Contraception method desired is IUD. Postpartum depression screening: negative (EPDS score is 4).  The following portions of the patient's history were reviewed and updated as appropriate: allergies, current medications, past family history, past medical history, past social history, past surgical history and problem list.  Review of Systems Pertinent items noted in HPI and remainder of comprehensive ROS otherwise negative.   Objective:    BP 113/74   Pulse 82   Ht 5\' 2"  (1.575 m)   Wt 160 lb 9.6 oz (72.8 kg)   LMP 08/16/2018   BMI 29.37 kg/m   General:  alert and no distress   Breasts:  inspection negative, no nipple discharge or bleeding, no masses or nodularity palpable  Lungs: clear to auscultation bilaterally  Heart:  regular rate and rhythm, S1, S2 normal, no murmur, click, rub or gallop  Abdomen: soft, non-tender; bowel sounds normal; no masses,  no organomegaly.     Vulva:  normal  Vagina: normal vagina, no discharge, exudate, lesion, or erythema  Cervix:  no cervical motion tenderness and no lesions  Corpus: normal size, contour, position, consistency, mobility, non-tender  Adnexa:  normal adnexa and no mass, fullness, tenderness  Rectal Exam: Not  performed.         Labs:  Lab Results  Component Value Date   HGB 8.9 (L) 05/16/2019     Assessment:   1. Postpartum care following vaginal delivery   2. Encounter for initial prescription of intrauterine contraceptive device (IUD)   3. Anemia, postpartum   4. History of gestational diabetes     Plan:    1. Contraception: IUD, desires Mirena after discussion of all IUD types.  Inserted today at patient's request.  2. Will check Hgb for h/o anemia.  3. History of GDM (diet controlled).  Will screen at annual exam in 3 months with HgbA1c.  4. Follow up in: 4 weeks for IUD check, and in 3 months for annual exam.       GYNECOLOGY OFFICE PROCEDURE NOTE  Jillian Anderson is a 34 y.o. [id number] here for Mirena IUD insertion. No GYN concerns.  Last pap smear was on 06/04/2016 and was normal.  IUD Insertion Procedure Note Patient identified, informed consent performed, consent signed.   Discussed risks of irregular bleeding, cramping, infection, malpositioning or misplacement of the IUD outside the uterus which may require further procedure such as laparoscopy. Also discussed >99% contraception efficacy, increased risk of ectopic pregnancy with failure of method.  Time out was performed.  Urine pregnancy test negative.  Speculum placed in the vagina.  Cervix visualized.  Cleaned with Betadine x 2.  Grasped anteriorly with a single tooth tenaculum.  Uterus sounded to 7.5 cm.  Mirena IUD placed per manufacturer's recommendations.  Strings trimmed to 3 cm. Tenaculum was removed, good hemostasis noted.  Patient  tolerated procedure well.   Patient was given post-procedure instructions.  She was advised to have backup contraception for one week.  Patient was also asked to check IUD strings periodically and follow up in 4 weeks for IUD check.   Lot: [id number] Expiration: 08/2021   Hildred Laser, MD [facility]

## 2019-06-26 NOTE — Patient Instructions (Signed)
Intrauterine Device Insertion, Care After  This sheet gives you information about how to care for yourself after your procedure. Your health care provider may also give you more specific instructions. If you have problems or questions, contact your health care provider. What can I expect after the procedure? After the procedure, it is common to have:  Cramps and pain in the abdomen.  Light bleeding (spotting) or heavier bleeding that is like your menstrual period. This may last for up to a few days.  Lower back pain.  Dizziness.  Headaches.  Nausea. Follow these instructions at home:  Before resuming sexual activity, check to make sure that you can feel the IUD string(s). You should be able to feel the end of the string(s) below the opening of your cervix. If your IUD string is in place, you may resume sexual activity. ? If you had a hormonal IUD inserted more than 7 days after your most recent period started, you will need to use a backup method of birth control for 7 days after IUD insertion. Ask your health care provider whether this applies to you.  Continue to check that the IUD is still in place by feeling for the string(s) after every menstrual period, or once a month.  Take over-the-counter and prescription medicines only as told by your health care provider.  Do not drive or use heavy machinery while taking prescription pain medicine.  Keep all follow-up visits as told by your health care provider. This is important. Contact a health care provider if:  You have bleeding that is heavier or lasts longer than a normal menstrual cycle.  You have a fever.  You have cramps or abdominal pain that get worse or do not get better with medicine.  You develop abdominal pain that is new or is not in the same area of earlier cramping and pain.  You feel lightheaded or weak.  You have abnormal or bad-smelling discharge from your vagina.  You have pain during sexual  activity.  You have any of the following problems with your IUD string(s): ? The string bothers or hurts you or your sexual partner. ? You cannot feel the string. ? The string has gotten longer.  You can feel the IUD in your vagina.  You think you may be pregnant, or you miss your menstrual period.  You think you may have an STI (sexually transmitted infection). Get help right away if:  You have flu-like symptoms.  You have a fever and chills.  You can feel that your IUD has slipped out of place. Summary  After the procedure, it is common to have cramps and pain in the abdomen. It is also common to have light bleeding (spotting) or heavier bleeding that is like your menstrual period.  Continue to check that the IUD is still in place by feeling for the string(s) after every menstrual period, or once a month.  Keep all follow-up visits as told by your health care provider. This is important.  Contact your health care provider if you have problems with your IUD string(s), such as the string getting longer or bothering you or your sexual partner. This information is not intended to replace advice given to you by your health care provider. Make sure you discuss any questions you have with your health care provider. Document Revised: 04/01/2017 Document Reviewed: 03/10/2016 Elsevier Patient Education  2020 [employer]  

## 2019-06-27 LAB — HEMOGLOBIN AND HEMATOCRIT, BLOOD
Hematocrit: 39.3 % (ref 34.0–46.6)
Hemoglobin: 12.9 g/dL (ref 11.1–15.9)

## 2019-07-26 NOTE — Progress Notes (Deleted)
   GYNECOLOGY OFFICE ENCOUNTER NOTE History:  34 y.o. [id number] here today for today for IUD string check; Mirena  IUD was placed  06/26/2019. No complaints about the IUD, no concerning side effects.  The following portions of the patient's history were reviewed and updated as appropriate: allergies, current medications, past family history, past medical history, past social history, past surgical history and problem list. Last pap smear on 05/26/2017 was normal, negative HRHPV.  Review of Systems:  Pertinent items are noted in HPI.   Objective:  Physical Exam Last menstrual period 08/16/2018, currently breastfeeding. CONSTITUTIONAL: Well-developed, well-nourished female in no acute distress.  ABDOMEN: Soft, no distention noted.   PELVIC: Normal appearing external genitalia; normal appearing vaginal mucosa and cervix.  IUD strings visualized, about *** cm in length outside cervix.  EXTREMITIES: non-tender, no edema NEUROLOGIC: grossly intact  Assessment & Plan:  Patient to keep IUD in place for up to seven years; can come in for removal if she desires pregnancy earlier or for any concerning side effects.   Hildred Laser, MD [facility]

## 2019-07-27 ENCOUNTER — Encounter: Payer: Medicaid Other | Admitting: Obstetrics and Gynecology

## 2019-08-08 ENCOUNTER — Ambulatory Visit (INDEPENDENT_AMBULATORY_CARE_PROVIDER_SITE_OTHER): Payer: Medicaid Other | Admitting: Obstetrics and Gynecology

## 2019-08-08 ENCOUNTER — Encounter: Payer: Self-pay | Admitting: Obstetrics and Gynecology

## 2019-08-08 ENCOUNTER — Other Ambulatory Visit: Payer: Self-pay

## 2019-08-08 VITALS — BP 121/81 | HR 93 | Ht 62.0 in | Wt 160.0 lb

## 2019-08-08 DIAGNOSIS — Z30431 Encounter for routine checking of intrauterine contraceptive device: Secondary | ICD-10-CM

## 2019-08-08 NOTE — Progress Notes (Signed)
    GYNECOLOGY OFFICE ENCOUNTER NOTE History:  34 y.o. [id number] here today for today for IUD string check; Mirena  IUD was placed  06/26/2019. No complaints about the IUD, no concerning side effects.  The following portions of the patient's history were reviewed and updated as appropriate: allergies, current medications, past family history, past medical history, past social history, past surgical history and problem list. Last pap smear on 05/26/2017 was normal, negative HRHPV.  Review of Systems:  Pertinent items are noted in HPI.   Objective:  Physical Exam Blood pressure 121/81, pulse 93, height 5\' 2"  (1.575 m), weight 160 lb (72.6 kg), currently breastfeeding. CONSTITUTIONAL: Well-developed, well-nourished female in no acute distress.  ABDOMEN: Soft, no distention noted.   PELVIC: Normal appearing external genitalia; normal appearing vaginal mucosa and cervix.  IUD strings visualized, about 3 cm in length outside cervix.  EXTREMITIES: Non-tender, no edema.  NEUROLOGIC: Grossly intact  Assessment & Plan:  Patient to keep IUD in place for up to five or six years; can come in for removal if she desires pregnancy earlier or for any concerning side effects.   , MD [facility]

## 2019-08-08 NOTE — Progress Notes (Signed)
Pt present for IUD check. Pt stated that she is doing well and having no issues with her IUD at this time.

## 2019-09-14 ENCOUNTER — Encounter: Payer: Medicaid Other | Admitting: Obstetrics and Gynecology

## 2019-09-27 ENCOUNTER — Encounter: Payer: Medicaid Other | Admitting: Obstetrics and Gynecology

## 2019-11-09 ENCOUNTER — Other Ambulatory Visit: Payer: Self-pay

## 2019-11-09 ENCOUNTER — Telehealth: Payer: Self-pay | Admitting: Obstetrics and Gynecology

## 2019-11-09 ENCOUNTER — Ambulatory Visit: Payer: Medicaid Other | Admitting: Internal Medicine

## 2019-11-09 VITALS — BP 122/80 | HR 100 | Temp 98.1°F | Ht 62.0 in | Wt 159.6 lb

## 2019-11-09 DIAGNOSIS — R110 Nausea: Secondary | ICD-10-CM

## 2019-11-09 DIAGNOSIS — B379 Candidiasis, unspecified: Secondary | ICD-10-CM | POA: Diagnosis not present

## 2019-11-09 DIAGNOSIS — N3000 Acute cystitis without hematuria: Secondary | ICD-10-CM

## 2019-11-09 DIAGNOSIS — Z87442 Personal history of urinary calculi: Secondary | ICD-10-CM | POA: Diagnosis not present

## 2019-11-09 MED ORDER — ONDANSETRON HCL 4 MG PO TABS: 4.0000 mg | ORAL_TABLET | Freq: Two times a day (BID) | ORAL | 0 refills | Status: DC | PRN

## 2019-11-09 MED ORDER — CIPROFLOXACIN HCL 500 MG PO TABS: 500.0000 mg | ORAL_TABLET | Freq: Two times a day (BID) | ORAL | 0 refills | Status: DC

## 2019-11-09 MED ORDER — FLUCONAZOLE 150 MG PO TABS: 150.0000 mg | ORAL_TABLET | Freq: Once | ORAL | 0 refills | Status: AC

## 2019-11-09 NOTE — Telephone Encounter (Signed)
Spoke to pt concerning her call to the office. apologized to pt concerning not reaching out to her soon. Pt was informed that the office was closed now. Pt stated that she got an appointment with her PCP at 3pm for her urine issues.

## 2019-11-09 NOTE — Progress Notes (Addendum)
Chief Complaint  Patient presents with  . Urinary Tract Infection   uti sx's 3 am with blood in urine, mid low back pain, freq, dysuria no fever. H/o kidney stones with lithotripsy but this does not feel like this and has associated nausea. She thinks her bladder is having spasms pain was 10/10 now 5/10   Of note has iud  Review of Systems  Constitutional: Negative for fever.  Respiratory: Negative for shortness of breath.   Cardiovascular: Negative for chest pain.  Genitourinary: Positive for dysuria, frequency and urgency.  Musculoskeletal: Positive for back pain.   Past Medical History:  Diagnosis Date  . Allergy   . Asthma without status asthmaticus 03/15/2016  . Astigmatism   . GERD (gastroesophageal reflux disease)   . Gestational diabetes   . History of kidney stones    In the past  . IUGR (intrauterine growth restriction) affecting care of mother 11/03/2017  . Migraines   . PCOS (polycystic ovarian syndrome)   . Polycystic disease, ovaries   . Pre-eclampsia affecting pregnancy, antepartum 11/01/2017  . Tachycardia   . Tachycardia    Past Surgical History:  Procedure Laterality Date  . DILATION AND EVACUATION N/A 04/23/2016   Procedure: DILATATION AND EVACUATION;  Surgeon: Herold Harms, MD;  Location: [facility] ORS;  Service: Gynecology;  Laterality: N/A;  . WISDOM TOOTH EXTRACTION     Family History  Problem Relation Age of Onset  . Hypertension Mother   . Hyperlipidemia Father   . Diabetes Father   . Asthma Brother   . Breast cancer Neg Hx   . Ovarian cancer Neg Hx   . Colon cancer Neg Hx    Social History   Socioeconomic History  . Marital status: Married    Spouse name: Not on file  . Number of children: Not on file  . Years of education: Not on file  . Highest education level: Not on file  Occupational History  . Not on file  Tobacco Use  . Smoking status: Never Smoker  . Smokeless tobacco: Never Used  Vaping Use  . Vaping Use: Never used   Substance and Sexual Activity  . Alcohol use: Yes    Alcohol/week: 1.0 standard drink    Types: 1 Glasses of wine per week    Comment: occassionally   . Drug use: No  . Sexual activity: Yes    Birth control/protection: I.U.D.    Comment: inserted 06/26/2019  Other Topics Concern  . Not on file  Social History Narrative  . Not on file   Social Determinants of Health   Financial Resource Strain:   . Difficulty of Paying Living Expenses:   Food Insecurity:   . Worried About Programme researcher, broadcasting/film/video in the Last Year:   . Barista in the Last Year:   Transportation Needs:   . Freight forwarder (Medical):   Marland Kitchen Lack of Transportation (Non-Medical):   Physical Activity:   . Days of Exercise per Week:   . Minutes of Exercise per Session:   Stress:   . Feeling of Stress :   Social Connections:   . Frequency of Communication with Friends and Family:   . Frequency of Social Gatherings with Friends and Family:   . Attends Religious Services:   . Active Member of Clubs or Organizations:   . Attends Banker Meetings:   Marland Kitchen Marital Status:   Intimate Partner Violence:   . Fear of Current or Ex-Partner:   .  Emotionally Abused:   Marland Kitchen Physically Abused:   . Sexually Abused:    Current Meds  Medication Sig  . levonorgestrel (MIRENA) 20 MCG/24HR IUD 1 each by Intrauterine route once. Inserted 06/26/2019  . loratadine (CLARITIN) 10 MG tablet Take 10 mg by mouth as needed.   . Prenatal Vit-Fe Fumarate-FA (PRENATAL MULTIVITAMIN) TABS tablet Take 1 tablet by mouth daily at 12 noon.   Allergies  Allergen Reactions  . Ceclor [Cefaclor]     hives  . Codeine     Body fights itself eyes dilate  . Red Dye     Hives, nausea and migraines   . Blackberry Flavor Nausea And Vomiting and Rash  . Raspberry Nausea And Vomiting and Rash   No results found for this or any previous visit (from the past 2160 hour(s)). Objective  Body mass index is 29.19 kg/m. Wt Readings from Last  3 Encounters:  11/09/19 159 lb 9.6 oz (72.4 kg)  08/08/19 160 lb (72.6 kg)  06/26/19 160 lb 9.6 oz (72.8 kg)   Temp Readings from Last 3 Encounters:  11/09/19 98.1 F (36.7 C) (Oral)  05/16/19 97.8 F (36.6 C) (Axillary)  07/11/18 98.1 F (36.7 C) (Oral)   BP Readings from Last 3 Encounters:  11/09/19 122/80  08/08/19 121/81  06/26/19 113/74   Pulse Readings from Last 3 Encounters:  11/09/19 100  08/08/19 93  06/26/19 82    Physical Exam Vitals and nursing note reviewed.  Constitutional:      Appearance: Normal appearance. She is well-developed and well-groomed.  HENT:     Head: Normocephalic and atraumatic.  Eyes:     Conjunctiva/sclera: Conjunctivae normal.     Pupils: Pupils are equal, round, and reactive to light.  Cardiovascular:     Rate and Rhythm: Normal rate and regular rhythm.     Pulses: Normal pulses.     Heart sounds: Normal heart sounds.  Pulmonary:     Effort: Pulmonary effort is normal.     Breath sounds: Normal breath sounds.  Abdominal:     General: Abdomen is flat. Bowel sounds are normal.     Tenderness: There is abdominal tenderness in the suprapubic area. There is no right CVA tenderness or left CVA tenderness.  Skin:    General: Skin is warm and dry.  Neurological:     General: No focal deficit present.     Mental Status: She is alert and oriented to person, place, and time. Mental status is at baseline.     Gait: Gait normal.  Psychiatric:        Attention and Perception: Attention and perception normal.        Mood and Affect: Mood and affect normal.        Speech: Speech normal.        Behavior: Behavior normal. Behavior is cooperative.        Thought Content: Thought content normal.        Cognition and Memory: Cognition and memory normal.        Judgment: Judgment normal.     Assessment  Plan  Acute cystitis without hematuria - Plan: Urinalysis, Routine w reflex microscopic, Urine Culture, ciprofloxacin (CIPRO) 500 MG tablet  bid x 5 days  Azo with pyridium otc  Cranberry and water  Yeast infection - Plan: fluconazole (DIFLUCAN) 150 MG tablet  Nausea - Plan: ondansetron (ZOFRAN) 4 MG tablet  Provider: Dr. French Ana McLean-Scocuzza-[facility]

## 2019-11-09 NOTE — Telephone Encounter (Signed)
Cherry patient. 

## 2019-11-09 NOTE — Telephone Encounter (Signed)
Pt called stating that she has a bad UTI and blood in her urine. The pt is said that she wants to come in and drop off a urine sample. I told her that I will send a message and that a nurse will give her a call. The pt asked what is the time frame I said they have 24 to 48 hours. The pt verbally understood

## 2019-11-09 NOTE — Addendum Note (Signed)
Addended by: Bonnell Public I on: 11/09/2019 04:35 PM   Modules accepted: Orders

## 2019-11-09 NOTE — Patient Instructions (Addendum)
Azo with pyridium no more than 2-3 days as needed  Cranberry juice most pure or cranberry supplements  Water   Urinary Tract Infection, Adult  A urinary tract infection (UTI) is an infection of any part of the urinary tract. The urinary tract includes the kidneys, ureters, bladder, and urethra. These organs make, store, and get rid of urine in the body. Your health care provider may use other names to describe the infection. An upper UTI affects the ureters and kidneys (pyelonephritis). A lower UTI affects the bladder (cystitis) and urethra (urethritis). What are the causes? Most urinary tract infections are caused by bacteria in your genital area, around the entrance to your urinary tract (urethra). These bacteria grow and cause inflammation of your urinary tract. What increases the risk? You are more likely to develop this condition if:  You have a urinary catheter that stays in place (indwelling).  You are not able to control when you urinate or have a bowel movement (you have incontinence).  You are female and you: ? Use a spermicide or diaphragm for birth control. ? Have low estrogen levels. ? Are pregnant.  You have certain genes that increase your risk (genetics).  You are sexually active.  You take antibiotic medicines.  You have a condition that causes your flow of urine to slow down, such as: ? An enlarged prostate, if you are female. ? Blockage in your urethra (stricture). ? A kidney stone. ? A nerve condition that affects your bladder control (neurogenic bladder). ? Not getting enough to drink, or not urinating often.  You have certain medical conditions, such as: ? Diabetes. ? A weak disease-fighting system (immunesystem). ? Sickle cell disease. ? Gout. ? Spinal cord injury. What are the signs or symptoms? Symptoms of this condition include:  Needing to urinate right away (urgently).  Frequent urination or passing small amounts of urine frequently.  Pain or  burning with urination.  Blood in the urine.  Urine that smells bad or unusual.  Trouble urinating.  Cloudy urine.  Vaginal discharge, if you are female.  Pain in the abdomen or the lower back. You may also have:  Vomiting or a decreased appetite.  Confusion.  Irritability or tiredness.  A fever.  Diarrhea. The first symptom in older adults may be confusion. In some cases, they may not have any symptoms until the infection has worsened. How is this diagnosed? This condition is diagnosed based on your medical history and a physical exam. You may also have other tests, including:  Urine tests.  Blood tests.  Tests for sexually transmitted infections (STIs). If you have had more than one UTI, a cystoscopy or imaging studies may be done to determine the cause of the infections. How is this treated? Treatment for this condition includes:  Antibiotic medicine.  Over-the-counter medicines to treat discomfort.  Drinking enough water to stay hydrated. If you have frequent infections or have other conditions such as a kidney stone, you may need to see a health care provider who specializes in the urinary tract (urologist). In rare cases, urinary tract infections can cause sepsis. Sepsis is a life-threatening condition that occurs when the body responds to an infection. Sepsis is treated in the hospital with IV antibiotics, fluids, and other medicines. Follow these instructions at home:  Medicines  Take over-the-counter and prescription medicines only as told by your health care provider.  If you were prescribed an antibiotic medicine, take it as told by your health care provider. Do not stop  using the antibiotic even if you start to feel better. General instructions  Make sure you: ? Empty your bladder often and completely. Do not hold urine for long periods of time. ? Empty your bladder after sex. ? Wipe from front to back after a bowel movement if you are female. Use  each tissue one time when you wipe.  Drink enough fluid to keep your urine pale yellow.  Keep all follow-up visits as told by your health care provider. This is important. Contact a health care provider if:  Your symptoms do not get better after 1-2 days.  Your symptoms go away and then return. Get help right away if you have:  Severe pain in your back or your lower abdomen.  A fever.  Nausea or vomiting. Summary  A urinary tract infection (UTI) is an infection of any part of the urinary tract, which includes the kidneys, ureters, bladder, and urethra.  Most urinary tract infections are caused by bacteria in your genital area, around the entrance to your urinary tract (urethra).  Treatment for this condition often includes antibiotic medicines.  If you were prescribed an antibiotic medicine, take it as told by your health care provider. Do not stop using the antibiotic even if you start to feel better.  Keep all follow-up visits as told by your health care provider. This is important. This information is not intended to replace advice given to you by your health care provider. Make sure you discuss any questions you have with your health care provider. Document Revised: 04/06/2018 Document Reviewed: 10/27/2017 Elsevier Patient Education  2020 [employer].    Hypokalemia Hypokalemia means that the amount of potassium in the blood is lower than normal. Potassium is a chemical (electrolyte) that helps regulate the amount of fluid in the body. It also stimulates muscle tightening (contraction) and helps nerves work properly. Normally, most of the body's potassium is inside cells, and only a very small amount is in the blood. Because the amount in the blood is so small, minor changes to potassium levels in the blood can be life-threatening. What are the causes? This condition may be caused by:  Antibiotic medicine.  Diarrhea or vomiting. Taking too much of a medicine that helps  you have a bowel movement (laxative) can cause diarrhea and lead to hypokalemia.  Chronic kidney disease (CKD).  Medicines that help the body get rid of excess fluid (diuretics).  Eating disorders, such as bulimia.  Low magnesium levels in the body.  Sweating a lot. What are the signs or symptoms? Symptoms of this condition include:  Weakness.  Constipation.  Fatigue.  Muscle cramps.  Mental confusion.  Skipped heartbeats or irregular heartbeat (palpitations).  Tingling or numbness. How is this diagnosed? This condition is diagnosed with a blood test. How is this treated? This condition may be treated by:  Taking potassium supplements by mouth.  Adjusting the medicines that you take.  Eating more foods that contain a lot of potassium. If your potassium level is very low, you may need to get potassium through an IV and be monitored in the hospital. Follow these instructions at home:   Take over-the-counter and prescription medicines only as told by your health care provider. This includes vitamins and supplements.  Eat a healthy diet. A healthy diet includes fresh fruits and vegetables, whole grains, healthy fats, and lean proteins.  If instructed, eat more foods that contain a lot of potassium. This includes: ? Nuts, such as peanuts and pistachios. ?  Seeds, such as sunflower seeds and pumpkin seeds. ? Peas, lentils, and lima beans. ? Whole grain and bran cereals and breads. ? Fresh fruits and vegetables, such as apricots, avocado, bananas, cantaloupe, kiwi, oranges, tomatoes, asparagus, and potatoes. ? Orange juice. ? Tomato juice. ? Red meats. ? Yogurt.  Keep all follow-up visits as told by your health care provider. This is important. Contact a health care provider if you:  Have weakness that gets worse.  Feel your heart pounding or racing.  Vomit.  Have diarrhea.  Have diabetes (diabetes mellitus) and you have trouble keeping your blood sugar  (glucose) in your target range. Get help right away if you:  Have chest pain.  Have shortness of breath.  Have vomiting or diarrhea that lasts for more than 2 days.  Faint. Summary  Hypokalemia means that the amount of potassium in the blood is lower than normal.  This condition is diagnosed with a blood test.  Hypokalemia may be treated by taking potassium supplements, adjusting the medicines that you take, or eating more foods that are high in potassium.  If your potassium level is very low, you may need to get potassium through an IV and be monitored in the hospital. This information is not intended to replace advice given to you by your health care provider. Make sure you discuss any questions you have with your health care provider. Document Revised: 11/30/2017 Document Reviewed: 11/30/2017 Elsevier Patient Education  2020 [employer].

## 2019-11-09 NOTE — Progress Notes (Signed)
Patient presenting with bladder spasms, blood in urine, lower back pain, frequency, and pain with urination.   Patient has an IUD placed that has caused irregular periods. LMP was June 29th and lasted for 11 days. Symptoms started this morning at 3:00 am.

## 2019-11-10 LAB — URINALYSIS, ROUTINE W REFLEX MICROSCOPIC
Bacteria, UA: NONE SEEN /HPF
Bilirubin Urine: NEGATIVE
Glucose, UA: NEGATIVE
Hyaline Cast: NONE SEEN /LPF
Ketones, ur: NEGATIVE
Nitrite: NEGATIVE
Protein, ur: NEGATIVE
Specific Gravity, Urine: 1.019 (ref 1.001–1.03)
pH: 5.5 (ref 5.0–8.0)

## 2019-11-10 LAB — URINE CULTURE
MICRO NUMBER:: 10686395
SPECIMEN QUALITY:: ADEQUATE

## 2019-11-12 ENCOUNTER — Other Ambulatory Visit: Payer: Self-pay | Admitting: Internal Medicine

## 2019-11-12 ENCOUNTER — Encounter: Payer: Self-pay | Admitting: Internal Medicine

## 2019-11-12 DIAGNOSIS — R319 Hematuria, unspecified: Secondary | ICD-10-CM

## 2019-11-12 DIAGNOSIS — Z87442 Personal history of urinary calculi: Secondary | ICD-10-CM

## 2019-11-12 MED ORDER — TAMSULOSIN HCL 0.4 MG PO CAPS: 0.4000 mg | ORAL_CAPSULE | Freq: Every day | ORAL | 2 refills | Status: DC

## 2019-11-12 NOTE — Telephone Encounter (Signed)
Pt is following up on message. She would like for you to call in the flomax.

## 2020-02-15 ENCOUNTER — Encounter: Payer: Medicaid Other | Admitting: Obstetrics and Gynecology

## 2020-02-19 ENCOUNTER — Telehealth (INDEPENDENT_AMBULATORY_CARE_PROVIDER_SITE_OTHER): Payer: Medicaid Other | Admitting: Nurse Practitioner

## 2020-02-19 ENCOUNTER — Encounter: Payer: Self-pay | Admitting: Nurse Practitioner

## 2020-02-19 VITALS — HR 72 | Temp 97.8°F | Resp 18 | Ht 62.0 in | Wt 160.0 lb

## 2020-02-19 DIAGNOSIS — J069 Acute upper respiratory infection, unspecified: Secondary | ICD-10-CM | POA: Diagnosis not present

## 2020-02-19 DIAGNOSIS — J019 Acute sinusitis, unspecified: Secondary | ICD-10-CM | POA: Diagnosis not present

## 2020-02-19 DIAGNOSIS — Z20822 Contact with and (suspected) exposure to covid-19: Secondary | ICD-10-CM | POA: Insufficient documentation

## 2020-02-19 LAB — POCT RAPID STREP A (OFFICE): Rapid Strep A Screen: NEGATIVE

## 2020-02-19 MED ORDER — GUAIFENESIN ER 600 MG PO TB12: 1200.0000 mg | ORAL_TABLET | Freq: Two times a day (BID) | ORAL | 0 refills | Status: AC

## 2020-02-19 MED ORDER — TRIAMCINOLONE ACETONIDE 55 MCG/ACT NA AERO: 2.0000 | INHALATION_SPRAY | Freq: Every day | NASAL | 12 refills | Status: DC

## 2020-02-19 NOTE — Progress Notes (Signed)
Virtual Visit via Video Note  This visit type was conducted due to national recommendations for restrictions regarding the COVID-19 pandemic (e.g. social distancing).  This format is felt to be most appropriate for this patient at this time.  All issues noted in this document were discussed and addressed.  No physical exam was performed (except for noted visual exam findings with Video Visits).   I connected with@ on 02/20/20 at  9:00 AM EDT by a video enabled telemedicine application or telephone and verified that I am speaking with the correct person using two identifiers. Location patient: home Location provider: work or home office Persons participating in the virtual visit: patient, provider  I discussed the limitations, risks, security and privacy concerns of performing an evaluation and management service by telephone and the availability of in person appointments. I also discussed with the patient that there may be a patient responsible charge related to this service. The patient expressed understanding and agreed to proceed.  Reason for visit: Sinus congestion and cough started on Saturday.  He has been drinking taking Mucinex D drinking warm liquid has not done a Covid test.  HPI: This 34 year old with history of mild intermittent asthma without complication, reports onset Saturday of clear discharge.  She saw little yellow drainage discharge yesterday.  Has had some mild frontal pressure but no headache.  Mild maxillary pressure.  She has postnasal drainage into her throat.  No fevers or chills.  She is coughing first thing in the morning, coughed up a little green phlegm.  No chest pain, shortness of breath, wheezing, body aches, fatigue, or GI symptoms.  She feels like that her symptoms are all in her head at this time.  No history of Covid vaccine or Covid testing.  To her knowledge she has had no direct Covid exposure. Regarding her asthma, she has not had any problems with this in many  years and has not had a prescription for albuterol or any type of inhaler as her asthma has been non problematic.    ROS: See pertinent positives and negatives per HPI.  Past Medical History:  Diagnosis Date  . Allergy   . Asthma without status asthmaticus 03/15/2016  . Astigmatism   . GERD (gastroesophageal reflux disease)   . Gestational diabetes   . History of kidney stones    In the past  . IUGR (intrauterine growth restriction) affecting care of mother 11/03/2017  . Migraines   . PCOS (polycystic ovarian syndrome)   . Polycystic disease, ovaries   . Pre-eclampsia affecting pregnancy, antepartum 11/01/2017  . Tachycardia   . Tachycardia     Past Surgical History:  Procedure Laterality Date  . DILATION AND EVACUATION N/A 04/23/2016   Procedure: DILATATION AND EVACUATION;  Surgeon: Herold Harms, MD;  Location: [facility] ORS;  Service: Gynecology;  Laterality: N/A;  . WISDOM TOOTH EXTRACTION      Family History  Problem Relation Age of Onset  . Hypertension Mother   . Hyperlipidemia Father   . Diabetes Father   . Asthma Brother   . Breast cancer Neg Hx   . Ovarian cancer Neg Hx   . Colon cancer Neg Hx     SOCIAL HX:   Current Outpatient Medications:  .  Ascorbic Acid (VITAMIN C) 500 MG CAPS, Take by mouth., Disp: , Rfl:  .  levonorgestrel (MIRENA) 20 MCG/24HR IUD, 1 each by Intrauterine route once. Inserted 06/26/2019, Disp: , Rfl:  .  loratadine (CLARITIN) 10 MG tablet, Take 10  mg by mouth as needed. , Disp: , Rfl:  .  Prenatal Vit-Fe Fumarate-FA (PRENATAL MULTIVITAMIN) TABS tablet, Take 1 tablet by mouth daily at 12 noon., Disp: , Rfl:  .  guaiFENesin (MUCINEX) 600 MG 12 hr tablet, Take 2 tablets (1,200 mg total) by mouth 2 (two) times daily for 7 days., Disp: 28 tablet, Rfl: 0 .  triamcinolone (NASACORT) 55 MCG/ACT AERO nasal inhaler, Place 2 sprays into the nose daily., Disp: 1 each, Rfl: 12  EXAM:  VITALS per patient if applicable: Weight 160 pounds temp  97.8  GENERAL: alert, oriented, appears well and in no acute distress  HEENT: atraumatic, conjunctiva clear, no obvious abnormalities on inspection of external nose and ears  NECK: normal movements of the head and neck  LUNGS: on inspection no signs of respiratory distress, breathing rate appears normal, no obvious gross SOB, gasping or wheezing  CV: no obvious cyanosis  MS: moves all visible extremities without noticeable abnormality  PSYCH/NEURO: pleasant and cooperative, no obvious depression or anxiety, speech and thought processing grossly intact  ASSESSMENT AND PLAN:  Discussed the following assessment and plan:  URI with cough and congestion - Plan: Novel Coronavirus, NAA ([facility]), POCT Influenza A/B, POCT rapid strep A, POCT rapid strep A, Novel Coronavirus, NAA ([facility]), POCT Influenza A/B, CANCELED: POCT Influenza A/B, CANCELED: POCT rapid strep A  Suspected COVID-19 virus infection  No problem-specific Assessment & Plan notes found for this encounter.  Patient advised: You are 3 days into the sinus congestion postnasal drainage.  Recommend treating this with symptom management at this time and if symptoms do not improve in 3-4 days,  then we will order an antibiotic.    Use the simply saline nasal spray as directed during the day.  This is not needed just for nighttime.  It can help rinse and thin secretions, function is a local decongestant for you during the day.  After this and you gently blow your nose, use steroid nasal spray as it will help decrease swelling and inflammation and overall stuffiness.   The Mucinex with phenylephrine is recommended for about 3 to 4 days.  I would then switch to plain Mucinex saline nasal spray.  Strep/influenza/Covid testing today  Please call back on Friday for symptom update.  Work note today.  I discussed the assessment and treatment plan with the patient. The patient was provided an opportunity to ask questions and all  were answered. The patient agreed with the plan and demonstrated an understanding of the instructions.   The patient was advised to call back or seek an in-person evaluation if the symptoms worsen or if the condition fails to improve as anticipated.  Amedeo Kinsman, NP Adult Nurse Practitioner [facility] [employer] [phone]

## 2020-02-19 NOTE — Patient Instructions (Addendum)
You are 3 days into the sinus congestion postnasal drainage.  Recommend treating this with symptom management at this time and if symptoms do not improve in 3-4 days, then we will order an antibiotic.    Use the simply saline nasal spray as directed during the day.  This is not needed just for nighttime.  It can help rinse and thin secretions, function is a local decongestant for you during the day.  After this and you gently blow your nose, use steroid nasal spray- Nasacort  as it will help decrease swelling and inflammation and overall stuffiness.   The Mucinex with phenylephrine is recommended for about 3 to 4 days.  I would then switch to plain Mucinex saline nasal spray.  Mucinex 1200 mg twice daily x 7 days to thin secretions.   Strep/influenza/Covid testing today  Please call back on Friday for symptom update.  Work note today.

## 2020-02-20 ENCOUNTER — Encounter: Payer: Self-pay | Admitting: Nurse Practitioner

## 2020-02-20 LAB — POCT INFLUENZA A/B
Influenza A, POC: NEGATIVE
Influenza B, POC: NEGATIVE

## 2020-02-21 LAB — SARS-COV-2, NAA 2 DAY TAT

## 2020-02-21 LAB — NOVEL CORONAVIRUS, NAA: SARS-CoV-2, NAA: NOT DETECTED

## 2020-03-07 ENCOUNTER — Encounter: Payer: Medicaid Other | Admitting: Obstetrics and Gynecology

## 2020-04-02 DIAGNOSIS — U071 COVID-19: Secondary | ICD-10-CM

## 2020-04-02 HISTORY — DX: COVID-19: U07.1

## 2020-04-22 ENCOUNTER — Encounter: Payer: Medicaid Other | Admitting: Obstetrics and Gynecology

## 2020-05-21 ENCOUNTER — Encounter: Payer: Self-pay | Admitting: Obstetrics and Gynecology

## 2020-05-21 ENCOUNTER — Other Ambulatory Visit (HOSPITAL_COMMUNITY)
Admission: RE | Admit: 2020-05-21 | Discharge: 2020-05-21 | Disposition: A | Discharge status: Home / Self Care | Payer: Medicaid Other | Source: Ambulatory Visit | Attending: Obstetrics and Gynecology | Admitting: Obstetrics and Gynecology

## 2020-05-21 ENCOUNTER — Ambulatory Visit (INDEPENDENT_AMBULATORY_CARE_PROVIDER_SITE_OTHER): Payer: Medicaid Other | Admitting: Obstetrics and Gynecology

## 2020-05-21 ENCOUNTER — Other Ambulatory Visit: Payer: Self-pay

## 2020-05-21 VITALS — BP 116/73 | HR 63 | Ht 62.0 in | Wt 171.8 lb

## 2020-05-21 DIAGNOSIS — Z8616 Personal history of COVID-19: Secondary | ICD-10-CM | POA: Diagnosis not present

## 2020-05-21 DIAGNOSIS — Z01419 Encounter for gynecological examination (general) (routine) without abnormal findings: Secondary | ICD-10-CM | POA: Diagnosis not present

## 2020-05-21 DIAGNOSIS — R102 Pelvic and perineal pain: Secondary | ICD-10-CM

## 2020-05-21 DIAGNOSIS — E669 Obesity, unspecified: Secondary | ICD-10-CM

## 2020-05-21 DIAGNOSIS — Z8759 Personal history of other complications of pregnancy, childbirth and the puerperium: Secondary | ICD-10-CM

## 2020-05-21 DIAGNOSIS — Z975 Presence of (intrauterine) contraceptive device: Secondary | ICD-10-CM

## 2020-05-21 DIAGNOSIS — Z8632 Personal history of gestational diabetes: Secondary | ICD-10-CM | POA: Diagnosis not present

## 2020-05-21 NOTE — Progress Notes (Signed)
GYNECOLOGY ANNUAL PHYSICAL EXAM PROGRESS NOTE  Subjective:    Jillian Anderson is a 35 y.o. [phone] female who presents for an annual exam.  The patient is sexually active.  The patient wears seatbelts: yes. The patient participates in regular exercise: no. Has the patient ever been transfused or tattooed?: no. The patient reports that there is not domestic violence in her life.    Jillian Anderson has the following complaints today:  1. She reports a history of COVID-19 infection last month.  Since that time, she has had strange issues with sharp pelvic pain.  She also notes an instance of pain with intercourse, however since then she has not had a problem.    Gynecologic History  Menarche age: 62 No LMP recorded. (Menstrual status: IUD). Contraception: IUD History of STI's: Denies Last Pap: 05/26/2017. Results were: normal.  Denies h/o abnormal pap smears.    Upstream - 05/23/20 0024      Pregnancy Intention Screening   Does the patient want to become pregnant in the next year? No    Does the patient's partner want to become pregnant in the next year? No    Would the patient like to discuss contraceptive options today? No      Contraception Wrap Up   Current Method IUD or IUS    End Method IUD or IUS    Contraception Counseling Provided No          The pregnancy intention screening data noted above was reviewed. Potential methods of contraception were discussed. The patient elected to continue with IUD or IUS.      OB History  Gravida Para Term Preterm AB Living  3 2 1 1 1 2   SAB IAB Ectopic Multiple Live Births  1 0 0 0 2    # Outcome Date GA Lbr Len/2nd Weight Sex Delivery Anes PTL Lv  3 Term 05/15/19 [redacted]w[redacted]d  6 lb 8.4 oz (2.96 kg) M Vag-Spont None N LIV     Complications: Hypertension, GDM (gestational diabetes mellitus)     Name: [redacted]w[redacted]d      Apgar1: 8  Apgar5: 9  2 Preterm 11/05/17 [redacted]w[redacted]d / 00:07 4 lb 10.8 oz (2.12 kg) M Vag-Spont Local Y LIV     Complications:  Preeclampsia     Name: [name]      Apgar1: 8  Apgar5: 9  1 SAB             Past Medical History:  Diagnosis Date  . Allergy   . Asthma without status asthmaticus 03/15/2016  . Astigmatism   . COVID-19 04/2020  . GERD (gastroesophageal reflux disease)   . Gestational diabetes   . History of kidney stones    In the past  . IUGR (intrauterine growth restriction) affecting care of mother 11/03/2017  . Migraines   . PCOS (polycystic ovarian syndrome)   . Polycystic disease, ovaries   . Pre-eclampsia affecting pregnancy, antepartum 11/01/2017  . Tachycardia   . Tachycardia     Past Surgical History:  Procedure Laterality Date  . DILATION AND EVACUATION N/A 04/23/2016   Procedure: DILATATION AND EVACUATION;  Surgeon: 04/25/2016, MD;  Location: [facility] ORS;  Service: Gynecology;  Laterality: N/A;  . WISDOM TOOTH EXTRACTION      Family History  Problem Relation Age of Onset  . Hypertension Mother   . Hyperlipidemia Father   . Diabetes Father   . Asthma Brother   . Breast cancer Neg Hx   .  Ovarian cancer Neg Hx   . Colon cancer Neg Hx     Social History   Socioeconomic History  . Marital status: Married    Spouse name: Not on file  . Number of children: Not on file  . Years of education: Not on file  . Highest education level: Not on file  Occupational History  . Not on file  Tobacco Use  . Smoking status: Never Smoker  . Smokeless tobacco: Never Used  Vaping Use  . Vaping Use: Never used  Substance and Sexual Activity  . Alcohol use: Yes    Alcohol/week: 1.0 standard drink    Types: 1 Glasses of wine per week    Comment: occassionally   . Drug use: No  . Sexual activity: Yes    Birth control/protection: I.U.D.    Comment: inserted 06/26/2019  Other Topics Concern  . Not on file  Social History Narrative  . Not on file   Social Determinants of Health   Financial Resource Strain: Not on file  Food Insecurity: Not on file  Transportation Needs: Not on  file  Physical Activity: Not on file  Stress: Not on file  Social Connections: Not on file  Intimate Partner Violence: Not on file    Current Outpatient Medications on File Prior to Visit  Medication Sig Dispense Refill  . levonorgestrel (MIRENA) 20 MCG/24HR IUD 1 each by Intrauterine route once. Inserted 06/26/2019    . loratadine (CLARITIN) 10 MG tablet Take 10 mg by mouth as needed.     . Prenatal Vit-Fe Fumarate-FA (PRENATAL MULTIVITAMIN) TABS tablet Take 1 tablet by mouth daily at 12 noon.     No current facility-administered medications on file prior to visit.    Allergies  Allergen Reactions  . Ceclor [Cefaclor]     hives  . Codeine     Body fights itself eyes dilate  . Red Dye     Hives, nausea and migraines   . Blackberry Flavor Nausea And Vomiting and Rash  . Raspberry Nausea And Vomiting and Rash      Review of Systems Constitutional: negative for chills, fatigue, fevers and sweats Eyes: negative for irritation, redness and visual disturbance Ears, nose, mouth, throat, and face: negative for hearing loss, nasal congestion, snoring and tinnitus Respiratory: negative for asthma, cough, sputum Cardiovascular: negative for chest pain, dyspnea, exertional chest pressure/discomfort, irregular heart beat, palpitations and syncope Gastrointestinal: negative for abdominal pain, change in bowel habits, nausea and vomiting Genitourinary: negativefor abnormal menstrual periods, genital lesions, sexual problems and vaginal discharge, dysuria and urinary incontinence. Positive for pelvic pain (see HPI).  Integument/breast: negative for breast lump, breast tenderness and nipple discharge Hematologic/lymphatic: negative for bleeding and easy bruising Musculoskeletal:negative for back pain and muscle weakness Neurological: negative for dizziness, headaches, vertigo and weakness Endocrine: negative for diabetic symptoms including polydipsia, polyuria and skin  dryness Allergic/Immunologic: negative for hay fever and urticaria        Objective:  Blood pressure 116/73, pulse 63, height 5\' 2"  (1.575 m), weight 171 lb 12.8 oz (77.9 kg), currently breastfeeding. Body mass index is 31.42 kg/m.  General Appearance:    Alert, cooperative, no distress, appears stated age, mild obesity  Head:    Normocephalic, without obvious abnormality, atraumatic  Eyes:    PERRL, conjunctiva/corneas clear, EOM's intact, both eyes  Ears:    Normal external ear canals, both ears  Nose:   Nares normal, septum midline, mucosa normal, no drainage or sinus tenderness  Throat:  Lips, mucosa, and tongue normal; teeth and gums normal  Neck:   Supple, symmetrical, trachea midline, no adenopathy; thyroid: no enlargement/tenderness/nodules; no carotid bruit or JVD  Back:     Symmetric, no curvature, ROM normal, no CVA tenderness  Lungs:     Clear to auscultation bilaterally, respirations unlabored  Chest Wall:    No tenderness or deformity   Heart:    Regular rate and rhythm, S1 and S2 normal, no murmur, rub or gallop  Breast Exam:    No tenderness, masses, or nipple abnormality  Abdomen:     Soft, non-tender, bowel sounds active all four quadrants, no masses, no organomegaly.    Genitalia:    Pelvic:external genitalia normal, vagina without lesions, or tenderness, moderate white thin discharge present. Rectovaginal septum  normal. Cervix normal in appearance, no cervical motion tenderness, IUD threads visible, 3 cm in length. No adnexal masses or tenderness.  Uterus normal size, shape, mobile, regular contours, nontender.  Rectal:    Normal external sphincter.  No hemorrhoids appreciated. Internal exam not done.   Extremities:   Extremities normal, atraumatic, no cyanosis or edema  Pulses:   2+ and symmetric all extremities  Skin:   Skin color, texture, turgor normal, no rashes or lesions  Lymph nodes:   Cervical, supraclavicular, and axillary nodes normal  Neurologic:    CNII-XII intact, normal strength, sensation and reflexes throughout   .  Labs:  Lab Results  Component Value Date   WBC 12.1 (H) 05/16/2019   HGB 12.9 06/26/2019   HCT 39.3 06/26/2019   MCV 95.5 05/16/2019   PLT 171 05/16/2019    Lab Results  Component Value Date   CREATININE 1.01 (H) 05/15/2019   BUN 12 05/15/2019   NA 136 05/15/2019   K 3.2 (L) 05/15/2019   CL 107 05/15/2019   CO2 21 (L) 05/15/2019    Lab Results  Component Value Date   ALT 17 05/15/2019   AST 21 05/15/2019   ALKPHOS 90 05/15/2019   BILITOT 0.5 05/15/2019    Lab Results  Component Value Date   TSH 1.150 10/10/2018     Assessment:   1. Encounter for well woman exam with routine gynecological exam   2. History of COVID-19   3. Pelvic pain   4. IUD (intrauterine device) in place   5. History of pre-eclampsia   6. History of gestational diabetes   7. Obesity (BMI 30.0-34.9)      Plan:    - Blood tests: see orders. - Breast self exam technique reviewed and patient encouraged to perform self-exam monthly. - Contraception: IUD. - Discussed healthy lifestyle modifications. - Pap smear up to date.  - Flu vaccine: Declined.  - COVID vaccination status: has not completed. Undecided. - Pelvic pain, unclear cause. IUD appears to be in proper place. Noted that patients can have miscellaneous symptoms after COVID-19 infection, but usually are temporary, lasting 2-3 months. Patient agrees, notes her father had a colon perforation several weeks after a COVID infection with no previous GI history. Will continue to monitor. If it doesn't resolve, can consider imaging. Vaginal culture performed to rule out infectious cause.  - Will continue to monitor annually with screens due to patient's h/o GDM and pre-eclampsia in prior pregnancies, as she is at risk for developing HTN and DM later in life (also with h/o PCOS this increases her risk).  - Follow up in 1 year for annual exam.     Hildred Laser,  MD [facility]

## 2020-05-21 NOTE — Progress Notes (Signed)
Pt present for annual exam. Pt stated that she was doing well. Pt declined flu vaccine. Pt had COVID 19 in Dec 2021. COVID vaccine not completed, but unsure if she will get vaccinated.

## 2020-05-21 NOTE — Patient Instructions (Signed)
Preventive Care 21-33 Years Old, Female Preventive care refers to lifestyle choices and visits with your health care provider that can promote health and wellness. This includes:  A yearly physical exam. This is also called an annual wellness visit.  Regular dental and eye exams.  Immunizations.  Screening for certain conditions.  Healthy lifestyle choices, such as: ? Eating a healthy diet. ? Getting regular exercise. ? Not using drugs or products that contain nicotine and tobacco. ? Limiting alcohol use. What can I expect for my preventive care visit? Physical exam Your health care provider may check your:  Height and weight. These may be used to calculate your BMI (body mass index). BMI is a measurement that tells if you are at a healthy weight.  Heart rate and blood pressure.  Body temperature.  Skin for abnormal spots. Counseling Your health care provider may ask you questions about your:  Past medical problems.  Family's medical history.  Alcohol, tobacco, and drug use.  Emotional well-being.  Home life and relationship well-being.  Sexual activity.  Diet, exercise, and sleep habits.  Work and work environment.  Access to firearms.  Method of birth control.  Menstrual cycle.  Pregnancy history. What immunizations do I need? Vaccines are usually given at various ages, according to a schedule. Your health care provider will recommend vaccines for you based on your age, medical history, and lifestyle or other factors, such as travel or where you work.   What tests do I need? Blood tests  Lipid and cholesterol levels. These may be checked every 5 years starting at age 20.  Hepatitis C test.  Hepatitis B test. Screening  Diabetes screening. This is done by checking your blood sugar (glucose) after you have not eaten for a while (fasting).  STD (sexually transmitted disease) testing, if you are at risk.  BRCA-related cancer screening. This may be  done if you have a family history of breast, ovarian, tubal, or peritoneal cancers.  Pelvic exam and Pap test. This may be done every 3 years starting at age 21. Starting at age 30, this may be done every 5 years if you have a Pap test in combination with an HPV test. Talk with your health care provider about your test results, treatment options, and if necessary, the need for more tests.   Follow these instructions at home: Eating and drinking  Eat a healthy diet that includes fresh fruits and vegetables, whole grains, lean protein, and low-fat dairy products.  Take vitamin and mineral supplements as recommended by your health care provider.  Do not drink alcohol if: ? Your health care provider tells you not to drink. ? You are pregnant, may be pregnant, or are planning to become pregnant.  If you drink alcohol: ? Limit how much you have to 0-1 drink a day. ? Be aware of how much alcohol is in your drink. In the [place] one drink equals one 12 oz bottle of beer (355 mL), one 5 oz glass of wine (148 mL), or one 1 oz glass of hard liquor (44 mL).   Lifestyle  Take daily care of your teeth and gums. Brush your teeth every morning and night with fluoride toothpaste. Floss one time each day.  Stay active. Exercise for at least 30 minutes 5 or more days each week.  Do not use any products that contain nicotine or tobacco, such as cigarettes, e-cigarettes, and chewing tobacco. If you need help quitting, ask your health care provider.  Do not   use drugs.  If you are sexually active, practice safe sex. Use a condom or other form of protection to prevent STIs (sexually transmitted infections).  If you do not wish to become pregnant, use a form of birth control. If you plan to become pregnant, see your health care provider for a prepregnancy visit.  Find healthy ways to cope with stress, such as: ? Meditation, yoga, or listening to music. ? Journaling. ? Talking to a trusted  person. ? Spending time with friends and family. Safety  Always wear your seat belt while driving or riding in a vehicle.  Do not drive: ? If you have been drinking alcohol. Do not ride with someone who has been drinking. ? When you are tired or distracted. ? While texting.  Wear a helmet and other protective equipment during sports activities.  If you have firearms in your house, make sure you follow all gun safety procedures.  Seek help if you have been physically or sexually abused. What's next?  Go to your health care provider once a year for an annual wellness visit.  Ask your health care provider how often you should have your eyes and teeth checked.  Stay up to date on all vaccines. This information is not intended to replace advice given to you by your health care provider. Make sure you discuss any questions you have with your health care provider. Document Revised: 12/16/2019 Document Reviewed: 12/29/2017 Elsevier Patient Education  2021 Elsevier Inc.     Breast Self-Awareness Breast self-awareness means being familiar with how your breasts look and feel. It involves checking your breasts regularly and reporting any changes to your health care provider. Practicing breast self-awareness is important. Sometimes changes may not be harmful (are benign), but sometimes a change in your breasts can be a sign of a serious medical problem. It is important to learn how to do this procedure correctly so that you can catch problems early, when treatment is more likely to be successful. All women should practice breast self-awareness, including women who have had breast implants. What you need:  A mirror.  A well-lit room. How to do a breast self-exam A breast self-exam is one way to learn what is normal for your breasts and whether your breasts are changing. To do a breast self-exam: Look for changes 1. Remove all the clothing above your waist. 2. Stand in front of a mirror  in a room with good lighting. 3. Put your hands on your hips. 4. Push your hands firmly downward. 5. Compare your breasts in the mirror. Look for differences between them (asymmetry), such as: ? Differences in shape. ? Differences in size. ? Puckers, dips, and bumps in one breast and not the other. 6. Look at each breast for changes in the skin, such as: ? Redness. ? Scaly areas. 7. Look for changes in your nipples, such as: ? Discharge. ? Bleeding. ? Dimpling. ? Redness. ? A change in position.   Feel for changes Carefully feel your breasts for lumps and changes. It is best to do this while lying on your back on the floor, and again while sitting or standing in the tub or shower with soapy water on your skin. Feel each breast in the following way: 1. Place the arm on the side of the breast you are examining above your head. 2. Feel your breast with the other hand. 3. Start in the nipple area and make -inch (2 cm) overlapping circles to feel your breast. Use   pads of your three middle fingers to do this. Apply light pressure, then medium pressure, then firm pressure. The light pressure will allow you to feel the tissue closest to the skin. The medium pressure will allow you to feel the tissue that is a little deeper. The firm pressure will allow you to feel the tissue close to the ribs. 4. Continue the overlapping circles, moving downward over the breast until you feel your ribs below your breast. 5. Move one finger-width toward the center of the body. Continue to use the -inch (2 cm) overlapping circles to feel your breast as you move slowly up toward your collarbone. 6. Continue the up-and-down exam using all three pressures until you reach your armpit.   Write down what you find Writing down what you find can help you remember what to discuss with your health care provider. Write down:  What is normal for each breast.  Any changes that you find in each breast, including: ? The kind  of changes you find. ? Any pain or tenderness. ? Size and location of any lumps.  Where you are in your menstrual cycle, if you are still menstruating. General tips and recommendations  Examine your breasts every month.  If you are breastfeeding, the best time to examine your breasts is after a feeding or after using a breast pump.  If you menstruate, the best time to examine your breasts is 5-7 days after your period. Breasts are generally lumpier during menstrual periods, and it may be more difficult to notice changes.  With time and practice, you will become more familiar with the variations in your breasts and more comfortable with the exam. Contact a health care provider if you:  See a change in the shape or size of your breasts or nipples.  See a change in the skin of your breast or nipples, such as a reddened or scaly area.  Have unusual discharge from your nipples.  Find a lump or thick area that was not there before.  Have pain in your breasts.  Have any concerns related to your breast health. Summary  Breast self-awareness includes looking for physical changes in your breasts, as well as feeling for any changes within your breasts.  Breast self-awareness should be performed in front of a mirror in a well-lit room.  You should examine your breasts every month. If you menstruate, the best time to examine your breasts is 5-7 days after your menstrual period.  Let your health care provider know of any changes you notice in your breasts, including changes in size, changes on the skin, pain or tenderness, or unusual fluid from your nipples. This information is not intended to replace advice given to you by your health care provider. Make sure you discuss any questions you have with your health care provider. Document Revised: 12/06/2017 Document Reviewed: 12/06/2017 Elsevier Patient Education  [place].

## 2020-05-22 LAB — COMPREHENSIVE METABOLIC PANEL
ALT: 11 IU/L (ref 0–32)
AST: 11 IU/L (ref 0–40)
Albumin/Globulin Ratio: 1.5 (ref 1.2–2.2)
Albumin: 4.3 g/dL (ref 3.8–4.8)
Alkaline Phosphatase: 82 IU/L (ref 44–121)
BUN/Creatinine Ratio: 16 (ref 9–23)
BUN: 13 mg/dL (ref 6–20)
Bilirubin Total: 0.3 mg/dL (ref 0.0–1.2)
CO2: 21 mmol/L (ref 20–29)
Calcium: 9.3 mg/dL (ref 8.7–10.2)
Chloride: 101 mmol/L (ref 96–106)
Creatinine, Ser: 0.8 mg/dL (ref 0.57–1.00)
GFR calc Af Amer: 113 mL/min/{1.73_m2} (ref >59)
GFR calc non Af Amer: 98 mL/min/{1.73_m2} (ref >59)
Globulin, Total: 2.8 g/dL (ref 1.5–4.5)
Glucose: 86 mg/dL (ref 65–99)
Potassium: 4 mmol/L (ref 3.5–5.2)
Sodium: 137 mmol/L (ref 134–144)
Total Protein: 7.1 g/dL (ref 6.0–8.5)

## 2020-05-22 LAB — CBC
Hematocrit: 41.2 % (ref 34.0–46.6)
Hemoglobin: 13.7 g/dL (ref 11.1–15.9)
MCH: 29.8 pg (ref 26.6–33.0)
MCHC: 33.3 g/dL (ref 31.5–35.7)
MCV: 90 fL (ref 79–97)
Platelets: 220 10*3/uL (ref 150–450)
RBC: 4.6 x10E6/uL (ref 3.77–5.28)
RDW: 12.4 % (ref 11.7–15.4)
WBC: 6.9 10*3/uL (ref 3.4–10.8)

## 2020-05-22 LAB — LIPID PANEL
Chol/HDL Ratio: 3.2 ratio (ref 0.0–4.4)
Cholesterol, Total: 211 mg/dL — ABNORMAL HIGH (ref 100–199)
HDL: 65 mg/dL (ref >39)
LDL Chol Calc (NIH): 131 mg/dL — ABNORMAL HIGH (ref 0–99)
Triglycerides: 83 mg/dL (ref 0–149)
VLDL Cholesterol Cal: 15 mg/dL (ref 5–40)

## 2020-05-22 LAB — TSH: TSH: 1.66 u[IU]/mL (ref 0.450–4.500)

## 2020-05-22 LAB — HEMOGLOBIN A1C
Est. average glucose Bld gHb Est-mCnc: 94 mg/dL
Hgb A1c MFr Bld: 4.9 % (ref 4.8–5.6)

## 2020-05-23 LAB — CERVICOVAGINAL ANCILLARY ONLY
Bacterial Vaginitis (gardnerella): POSITIVE — AB
Candida Glabrata: NEGATIVE
Candida Vaginitis: NEGATIVE
Comment: NEGATIVE
Comment: NEGATIVE
Comment: NEGATIVE

## 2020-05-23 MED ORDER — METRONIDAZOLE 500 MG PO TABS: 500.0000 mg | ORAL_TABLET | Freq: Two times a day (BID) | ORAL | 0 refills | Status: DC

## 2020-05-24 ENCOUNTER — Encounter: Payer: Self-pay | Admitting: Obstetrics and Gynecology

## 2020-06-11 ENCOUNTER — Telehealth: Payer: Self-pay | Admitting: Family Medicine

## 2020-06-11 ENCOUNTER — Other Ambulatory Visit: Payer: Self-pay

## 2020-06-11 ENCOUNTER — Emergency Department
Admission: EM | Admit: 2020-06-11 | Discharge: 2020-06-11 | Disposition: A | Discharge status: Home / Self Care | Payer: Medicaid Other | Attending: Emergency Medicine | Admitting: Emergency Medicine

## 2020-06-11 ENCOUNTER — Emergency Department: Payer: Medicaid Other

## 2020-06-11 DIAGNOSIS — J4520 Mild intermittent asthma, uncomplicated: Secondary | ICD-10-CM | POA: Insufficient documentation

## 2020-06-11 DIAGNOSIS — K529 Noninfective gastroenteritis and colitis, unspecified: Secondary | ICD-10-CM | POA: Diagnosis not present

## 2020-06-11 DIAGNOSIS — Z8616 Personal history of COVID-19: Secondary | ICD-10-CM | POA: Insufficient documentation

## 2020-06-11 DIAGNOSIS — R1010 Upper abdominal pain, unspecified: Secondary | ICD-10-CM | POA: Diagnosis present

## 2020-06-11 DIAGNOSIS — R1013 Epigastric pain: Secondary | ICD-10-CM | POA: Diagnosis not present

## 2020-06-11 LAB — URINALYSIS, COMPLETE (UACMP) WITH MICROSCOPIC
Bilirubin Urine: NEGATIVE
Glucose, UA: NEGATIVE mg/dL
Hgb urine dipstick: NEGATIVE
Ketones, ur: 5 mg/dL — AB
Nitrite: POSITIVE — AB
Protein, ur: NEGATIVE mg/dL
Specific Gravity, Urine: 1.016 (ref 1.005–1.030)
WBC, UA: >50 WBC/hpf — ABNORMAL HIGH (ref 0–5)
pH: 7 (ref 5.0–8.0)

## 2020-06-11 LAB — COMPREHENSIVE METABOLIC PANEL
ALT: 20 U/L (ref 0–44)
AST: 18 U/L (ref 15–41)
Albumin: 4.1 g/dL (ref 3.5–5.0)
Alkaline Phosphatase: 60 U/L (ref 38–126)
Anion gap: 10 (ref 5–15)
BUN: 12 mg/dL (ref 6–20)
CO2: 25 mmol/L (ref 22–32)
Calcium: 8.8 mg/dL — ABNORMAL LOW (ref 8.9–10.3)
Chloride: 104 mmol/L (ref 98–111)
Creatinine, Ser: 0.73 mg/dL (ref 0.44–1.00)
GFR, Estimated: >60 mL/min (ref >60)
Glucose, Bld: 109 mg/dL — ABNORMAL HIGH (ref 70–99)
Potassium: 3.3 mmol/L — ABNORMAL LOW (ref 3.5–5.1)
Sodium: 139 mmol/L (ref 135–145)
Total Bilirubin: 0.7 mg/dL (ref 0.3–1.2)
Total Protein: 7.5 g/dL (ref 6.5–8.1)

## 2020-06-11 LAB — CBC
HCT: 42 % (ref 36.0–46.0)
Hemoglobin: 13.9 g/dL (ref 12.0–15.0)
MCH: 30.1 pg (ref 26.0–34.0)
MCHC: 33.1 g/dL (ref 30.0–36.0)
MCV: 90.9 fL (ref 80.0–100.0)
Platelets: 201 10*3/uL (ref 150–400)
RBC: 4.62 MIL/uL (ref 3.87–5.11)
RDW: 12.8 % (ref 11.5–15.5)
WBC: 10.2 10*3/uL (ref 4.0–10.5)
nRBC: 0 % (ref 0.0–0.2)

## 2020-06-11 LAB — PREGNANCY, URINE: Preg Test, Ur: NEGATIVE

## 2020-06-11 LAB — LIPASE, BLOOD: Lipase: 30 U/L (ref 11–51)

## 2020-06-11 MED ORDER — POTASSIUM CHLORIDE CRYS ER 20 MEQ PO TBCR
20.0000 meq | EXTENDED_RELEASE_TABLET | Freq: Once | ORAL | Status: AC
  Administered 2020-06-11: 20 meq via ORAL
  Filled 2020-06-11: qty 1

## 2020-06-11 MED ORDER — PROMETHAZINE HCL 25 MG/ML IJ SOLN
12.5000 mg | Freq: Once | INTRAMUSCULAR | Status: AC
  Administered 2020-06-11: 12.5 mg via INTRAVENOUS
  Filled 2020-06-11: qty 1

## 2020-06-11 MED ORDER — PROMETHAZINE HCL 12.5 MG PO TABS: 12.5000 mg | ORAL_TABLET | Freq: Four times a day (QID) | ORAL | 0 refills | Status: DC | PRN

## 2020-06-11 MED ORDER — LACTATED RINGERS IV BOLUS
1000.0000 mL | Freq: Once | INTRAVENOUS | Status: AC
  Administered 2020-06-11: 1000 mL via INTRAVENOUS

## 2020-06-11 NOTE — Telephone Encounter (Signed)
Pt called she is having abdominal pain, vomiting and nausea  Pt said that she will to UC to be seen in person

## 2020-06-11 NOTE — ED Triage Notes (Signed)
Pt to ED POV for chief complaint of mid abdominal pain that started Sunday with nausea,vomiting, back pain.  Recent baceterial infection when went to ob, finished antibiotic.  Has taken zofran with no relief

## 2020-06-11 NOTE — ED Provider Notes (Signed)
[facility] Emergency Department Provider Note   ____________________________________________   Event Date/Time   First MD Initiated Contact with Patient 06/11/20 [phone]     (approximate)  I have reviewed the triage vital signs and the nursing notes.   HISTORY  Chief Complaint Abdominal Pain    HPI Jillian Anderson is a 35 y.o. female with past medical history of PCOS, kidney stones, and GERD who presents to the ED complaining of abdominal pain.  Patient reports that for the past 3 days she has been dealing with intermittent cramping pain in her upper abdomen.  This is been associated with nausea, multiple episodes of vomiting, and watery diarrhea.  She has not noticed any blood in her emesis or stool.  She denies any fevers, cough, chest pain, or shortness of breath.  She was diagnosed with COVID-19 in December and all respiratory symptoms have resolved.  She did see her OB/GYN at the end of January for routine follow-up, was treated with metronidazole at that time for possible infection.  She currently denies any pelvic pain, vaginal bleeding, or vaginal discharge.  She does state that she has not had regular menses since November, but currently has an IUD in place.        Past Medical History:  Diagnosis Date  . Allergy   . Asthma without status asthmaticus 03/15/2016  . Astigmatism   . COVID-19 04/2020  . GERD (gastroesophageal reflux disease)   . Gestational diabetes   . History of kidney stones    In the past  . IUGR (intrauterine growth restriction) affecting care of mother 11/03/2017  . Migraines   . Nephrolithiasis 09/03/2011  . PCOS (polycystic ovarian syndrome)   . Polycystic disease, ovaries   . Polycystic ovarian syndrome 03/15/2016  . Pre-eclampsia affecting pregnancy, antepartum 11/01/2017  . Tachycardia   . Tachycardia     Patient Active Problem List   Diagnosis Date Noted  . Acute sinusitis 02/19/2020  . Suspected COVID-19 virus  infection 02/19/2020  . Ear pain 06/04/2016  . Hyperlipidemia 06/04/2016  . Obesity (BMI 30.0-34.9) 03/15/2016  . Mild intermittent asthma without complication 03/15/2016  . Folliculitis 09/12/2015  . Dermatitis 09/12/2015  . GERD (gastroesophageal reflux disease) 08/01/2013  . Palpitations 07/08/2011  . IBS (irritable bowel syndrome) 07/08/2011  . Migraines 01/06/2011  . Allergic rhinitis 01/06/2011    Past Surgical History:  Procedure Laterality Date  . DILATION AND EVACUATION N/A 04/23/2016   Procedure: DILATATION AND EVACUATION;  Surgeon: Herold Harms, MD;  Location: [facility] ORS;  Service: Gynecology;  Laterality: N/A;  . WISDOM TOOTH EXTRACTION      Prior to Admission medications   Medication Sig Start Date End Date Taking? Authorizing Provider  promethazine (PHENERGAN) 12.5 MG tablet Take 1 tablet (12.5 mg total) by mouth every 6 (six) hours as needed for nausea or vomiting. 06/11/20  Yes Chesley Noon, MD  levonorgestrel (MIRENA) 20 MCG/24HR IUD 1 each by Intrauterine route once. Inserted 06/26/2019    [provider]  loratadine (CLARITIN) 10 MG tablet Take 10 mg by mouth as needed.  12/19/18   [provider]  metroNIDAZOLE (FLAGYL) 500 MG tablet Take 1 tablet (500 mg total) by mouth 2 (two) times daily. 05/23/20   Hildred Laser, MD  Prenatal Vit-Fe Fumarate-FA (PRENATAL MULTIVITAMIN) TABS tablet Take 1 tablet by mouth daily at 12 noon.    [provider]    Allergies Ceclor [cefaclor], Codeine, Red dye, Blackberry flavor, and Raspberry  Family History  Problem Relation Age of Onset  . Hypertension Mother   . Hyperlipidemia Father   . Diabetes Father   . Asthma Brother   . Breast cancer Neg Hx   . Ovarian cancer Neg Hx   . Colon cancer Neg Hx     Social History Social History   Tobacco Use  . Smoking status: Never Smoker  . Smokeless tobacco: Never Used  Vaping Use  . Vaping Use: Never used  Substance Use Topics  . Alcohol  use: Yes    Alcohol/week: 1.0 standard drink    Types: 1 Glasses of wine per week    Comment: occassionally   . Drug use: No    Review of Systems  Constitutional: No fever/chills Eyes: No visual changes. ENT: No sore throat. Cardiovascular: Denies chest pain. Respiratory: Denies shortness of breath. Gastrointestinal: Positive for abdominal pain, nausea, vomiting, and diarrhea.  No constipation. Genitourinary: Negative for dysuria. Musculoskeletal: Negative for back pain. Skin: Negative for rash. Neurological: Negative for headaches, focal weakness or numbness.  ____________________________________________   PHYSICAL EXAM:  VITAL SIGNS: ED Triage Vitals  Enc Vitals Group     BP 06/11/20 0852 127/90     Pulse Rate 06/11/20 0854 83     Resp 06/11/20 0854 14     Temp 06/11/20 0852 98.1 F (36.7 C)     Temp Source 06/11/20 0852 Oral     SpO2 06/11/20 0852 100 %     Weight 06/11/20 0852 169 lb 12.1 oz (77 kg)     Height 06/11/20 0852 5\' 2"  (1.575 m)     Head Circumference --      Peak Flow --      Pain Score 06/11/20 0852 8     Pain Loc --      Pain Edu? --      Excl. in GC? --     Constitutional: Alert and oriented. Eyes: Conjunctivae are normal. Head: Atraumatic. Nose: No congestion/rhinnorhea. Mouth/Throat: Mucous membranes are moist. Neck: Normal ROM Cardiovascular: Normal rate, regular rhythm. Grossly normal heart sounds. Respiratory: Normal respiratory effort.  No retractions. Lungs CTAB. Gastrointestinal: Soft and mildly tender to palpation in the epigastrium with no rebound or guarding. No distention. Genitourinary: deferred Musculoskeletal: No lower extremity tenderness nor edema. Neurologic:  Normal speech and language. No gross focal neurologic deficits are appreciated. Skin:  Skin is warm, dry and intact. No rash noted. Psychiatric: Mood and affect are normal. Speech and behavior are normal.  ____________________________________________   LABS (all  labs ordered are listed, but only abnormal results are displayed)  Labs Reviewed  COMPREHENSIVE METABOLIC PANEL - Abnormal; Notable for the following components:      Result Value   Potassium 3.3 (*)    Glucose, Bld 109 (*)    Calcium 8.8 (*)    All other components within normal limits  URINALYSIS, COMPLETE (UACMP) WITH MICROSCOPIC - Abnormal; Notable for the following components:   Color, Urine YELLOW (*)    APPearance CLOUDY (*)    Ketones, ur 5 (*)    Nitrite POSITIVE (*)    Leukocytes,Ua LARGE (*)    WBC, UA >50 (*)    Bacteria, UA FEW (*)    All other components within normal limits  URINE CULTURE  LIPASE, BLOOD  CBC  PREGNANCY, URINE   ____________________________________________  EKG  ED ECG REPORT I, 08/09/20, the attending physician, personally viewed and interpreted this ECG.   Date: 06/11/2020  EKG Time: 8:54  Rate: 86  Rhythm:  normal sinus rhythm  Axis: Normal  Intervals:none  ST&T Change: None   PROCEDURES  Procedure(s) performed (including Critical Care):  Procedures   ____________________________________________   INITIAL IMPRESSION / ASSESSMENT AND PLAN / ED COURSE       35 year old female with past medical history of PCOS, kidney stones, and GERD who presents to the ED complaining of upper abdominal cramping, vomiting, and diarrhea for the past 3 days.  Pain is relatively mild at this time and she has mild tenderness in her epigastrium.  She was offered pain medication, but declines and would only like to receive nausea medicine as well as IV fluids for now.  We will screen labs as well as UA and pregnancy test.  Symptoms sound most consistent with a gastroenteritis and we will reassess following labs and symptomatic treatment but may hold off on imaging for now.  Patient reports feeling better following IV fluids and Phenergan.  Right upper quadrant ultrasound was performed and negative for acute process, labs are also reassuring.  UA  shows questionable UTI but seems most consistent with a contaminated sample.  Given her lack of urinary symptoms, we will hold off on antibiotics for now.  Patient now tolerating p.o. without difficulty and is appropriate for discharge home with PCP follow-up.  She was counseled to return to the ED for new worsening symptoms, patient and husband agree with plan.      ____________________________________________   FINAL CLINICAL IMPRESSION(S) / ED DIAGNOSES  Final diagnoses:  Epigastric pain  Gastroenteritis     ED Discharge Orders         Ordered    promethazine (PHENERGAN) 12.5 MG tablet  Every 6 hours PRN        06/11/20 1213           Note:  This document was prepared using Dragon voice recognition software and may include unintentional dictation errors.   Chesley Noon, MD 06/11/20 1214

## 2020-06-11 NOTE — Telephone Encounter (Addendum)
Verified patient currently in ER.    [facility] Night - Cl TELEPHONE ADVICE RECORD [facility] Patient Name: Jillian Anderson Gender: Female DOB: [birth year] Age: 35 Y 1 M 16 D Return Phone Number: [phone] (Primary) Address: City/State/Zip: [name] [address] Client [facility] Night - Cl Client Site [facility] - Night Physician Marikay Alar - MD Contact Type Call Who Is Calling Patient / Member / Family / Caregiver Call Type Triage / Clinical Relationship To Patient Self Return Phone Number [phone] (Primary) Chief Complaint Vomiting Reason for Call Request to Schedule Office Appointment Initial Comment Caller is needing an appointment ASAP. Caller decided she would like to speak to nurse regarding symptoms: vomiting, diarrhea, nausea, and stomach pain.,= Translation No Nurse Assessment Nurse: Waynetta Sandy, RN, Sarah Date/Time Lamount Cohen Time): 06/11/2020 6:15:02 AM Confirm and document reason for call. If symptomatic, describe symptoms. ---Pt. states she is having vomiting, diarrhea, nausea, and stomach pain. Symptoms started on Saturday. Does the patient have any new or worsening symptoms? ---Yes Will a triage be completed? ---Yes Related visit to physician within the last 2 weeks? ---No Does the PT have any chronic conditions? (i.e. diabetes, asthma, this includes High risk factors for pregnancy, etc.) ---No Is the patient pregnant or possibly pregnant? (Ask all females between the ages of 63-55) ---No Is this a behavioral health or substance abuse call? ---No Guidelines Guideline Title Affirmed Question Affirmed Notes Nurse Date/Time (Eastern Time) Vomiting [1] Vomiting AND [2] abdomen looks much more swollen than usual Jillian Anderson 06/11/2020 6:15:51 AM Disp. Time Lamount Cohen Time) Disposition Final User 06/11/2020 6:18:31 AM See HCP within 4 Hours (or PCP triage) Yes  Waynetta Sandy, RN, Maralyn Sago PLEASE NOTE: All timestamps contained within this report are represented as [place] Standard Time. CONFIDENTIALTY NOTICE: This fax transmission is intended only for the addressee. It contains information that is legally privileged, confidential or otherwise protected from use or disclosure. If you are not the intended recipient, you are strictly prohibited from reviewing, disclosing, copying using or disseminating any of this information or taking any action in reliance on or regarding this information. If you have received this fax in error, please notify us immediately by telephone so that we can arrange for its return to [place]. Phone: [phone], Toll-Free: [phone], Fax: [phone] Page: 2 of 2 Call Id: [id number] Caller Disagree/Comply Comply Caller Understands Yes PreDisposition InappropriateToAsk Care Advice Given Per Guideline SEE HCP (OR PCP TRIAGE) WITHIN 4 HOURS: * IF OFFICE WILL BE OPEN: You need to be seen within the next 3 or 4 hours. Call your doctor (or NP/PA) now or as soon as the office opens. * IF OFFICE WILL BE CLOSED AND NO PCP (PRIMARY CARE PROVIDER) SECOND-LEVEL TRIAGE: You need to be seen within the next 3 or 4 hours. A nearby Urgent Care Center [facility]) is often a good source of care. Another choice is to go to the ED. Go sooner if you become worse. NOTHING BY MOUTH: * Do not eat or drink anything for now. CALL BACK IF: * You become worse CARE ADVICE pe

## 2020-06-12 ENCOUNTER — Telehealth: Payer: Self-pay | Admitting: *Deleted

## 2020-06-12 LAB — URINE CULTURE: Culture: <10000 — AB

## 2020-06-12 NOTE — Telephone Encounter (Signed)
Transition Care Management Unsuccessful Follow-up Telephone Call  Date of discharge and from where:  06/11/2020 [facility] ED  Attempts:  1st Attempt  Reason for unsuccessful TCM follow-up call:  Left voice message

## 2020-06-12 NOTE — Telephone Encounter (Signed)
Yes, we can treat her with some Macrobid

## 2020-06-13 ENCOUNTER — Other Ambulatory Visit: Payer: Self-pay

## 2020-06-13 MED ORDER — NITROFURANTOIN MONOHYD MACRO 100 MG PO CAPS: 100.0000 mg | ORAL_CAPSULE | Freq: Two times a day (BID) | ORAL | 0 refills | Status: DC

## 2020-06-13 NOTE — Telephone Encounter (Signed)
Transition Care Management Unsuccessful Follow-up Telephone Call  Date of discharge and from where:  06/11/2020 [facility] ED  Attempts:  2nd Attempt  Reason for unsuccessful TCM follow-up call:  Left voice message

## 2020-06-13 NOTE — Telephone Encounter (Signed)
Would you like for me to send in Macrobid 100 mg BID x 7 days. Jillian Anderson

## 2020-06-15 ENCOUNTER — Emergency Department
Admission: EM | Admit: 2020-06-15 | Discharge: 2020-06-15 | Disposition: A | Discharge status: Home / Self Care | Payer: Medicaid Other | Attending: Emergency Medicine | Admitting: Emergency Medicine

## 2020-06-15 ENCOUNTER — Other Ambulatory Visit: Payer: Self-pay

## 2020-06-15 DIAGNOSIS — R109 Unspecified abdominal pain: Secondary | ICD-10-CM | POA: Diagnosis not present

## 2020-06-15 DIAGNOSIS — Z5321 Procedure and treatment not carried out due to patient leaving prior to being seen by health care provider: Secondary | ICD-10-CM | POA: Diagnosis not present

## 2020-06-15 LAB — URINALYSIS, COMPLETE (UACMP) WITH MICROSCOPIC
Bilirubin Urine: NEGATIVE
Glucose, UA: NEGATIVE mg/dL
Ketones, ur: NEGATIVE mg/dL
Nitrite: POSITIVE — AB
Protein, ur: NEGATIVE mg/dL
Specific Gravity, Urine: 1.004 — ABNORMAL LOW (ref 1.005–1.030)
pH: 6 (ref 5.0–8.0)

## 2020-06-15 LAB — CBC
HCT: 38.6 % (ref 36.0–46.0)
Hemoglobin: 12.6 g/dL (ref 12.0–15.0)
MCH: 29.7 pg (ref 26.0–34.0)
MCHC: 32.6 g/dL (ref 30.0–36.0)
MCV: 91 fL (ref 80.0–100.0)
Platelets: 221 10*3/uL (ref 150–400)
RBC: 4.24 MIL/uL (ref 3.87–5.11)
RDW: 12.6 % (ref 11.5–15.5)
WBC: 16.4 10*3/uL — ABNORMAL HIGH (ref 4.0–10.5)
nRBC: 0 % (ref 0.0–0.2)

## 2020-06-15 LAB — BASIC METABOLIC PANEL
Anion gap: 11 (ref 5–15)
BUN: 13 mg/dL (ref 6–20)
CO2: 21 mmol/L — ABNORMAL LOW (ref 22–32)
Calcium: 8.7 mg/dL — ABNORMAL LOW (ref 8.9–10.3)
Chloride: 98 mmol/L (ref 98–111)
Creatinine, Ser: 0.9 mg/dL (ref 0.44–1.00)
GFR, Estimated: >60 mL/min (ref >60)
Glucose, Bld: 111 mg/dL — ABNORMAL HIGH (ref 70–99)
Potassium: 3.4 mmol/L — ABNORMAL LOW (ref 3.5–5.1)
Sodium: 130 mmol/L — ABNORMAL LOW (ref 135–145)

## 2020-06-15 LAB — POC URINE PREG, ED: Preg Test, Ur: NEGATIVE

## 2020-06-15 NOTE — ED Triage Notes (Addendum)
Pt states she has been having r flank pain since 1330- pt has a hx of kidney stones- pt denies urinary symptoms but has been taking azo

## 2020-06-16 ENCOUNTER — Telehealth: Payer: Self-pay

## 2020-06-16 ENCOUNTER — Telehealth: Payer: Self-pay | Admitting: Emergency Medicine

## 2020-06-16 NOTE — Telephone Encounter (Signed)
Called patient due to left emergency department before provider exam to inquire about condition and follow up plans. Left message. 

## 2020-06-16 NOTE — Telephone Encounter (Signed)
Transition Care Management Unsuccessful Follow-up Telephone Call  Date of discharge and from where:  06/11/2020 [facility] ED  Attempts:  3rd Attempt  Reason for unsuccessful TCM follow-up call:  Left voice message

## 2020-06-16 NOTE — Telephone Encounter (Signed)
Transition Care Management Unsuccessful Follow-up Telephone Call  Date of discharge and from where:  06/15/20 from [facility]  Attempts:  1st Attempt  Reason for unsuccessful TCM follow-up call:  Left voice message     

## 2020-06-17 NOTE — Telephone Encounter (Signed)
Transition Care Management Unsuccessful Follow-up Telephone Call  Date of discharge and from where:  06/15/20 from [facility]  Attempts:  2nd Attempt  Reason for unsuccessful TCM follow-up call:  Left voice message

## 2020-06-18 ENCOUNTER — Encounter: Payer: Self-pay | Admitting: Family Medicine

## 2020-06-18 NOTE — Telephone Encounter (Signed)
Transition Care Management Unsuccessful Follow-up Telephone Call  Date of discharge and from where:  06/15/2020 from [facility]  Attempts:  3rd Attempt  Reason for unsuccessful TCM follow-up call:  Left voice message

## 2020-06-20 ENCOUNTER — Encounter: Payer: Self-pay | Admitting: Family Medicine

## 2020-06-20 ENCOUNTER — Ambulatory Visit: Payer: Medicaid Other | Admitting: Family Medicine

## 2020-06-20 ENCOUNTER — Other Ambulatory Visit: Payer: Self-pay

## 2020-06-20 VITALS — Ht 62.0 in | Wt 171.0 lb

## 2020-06-20 DIAGNOSIS — N200 Calculus of kidney: Secondary | ICD-10-CM

## 2020-06-20 DIAGNOSIS — R109 Unspecified abdominal pain: Secondary | ICD-10-CM | POA: Diagnosis not present

## 2020-06-20 DIAGNOSIS — Z87442 Personal history of urinary calculi: Secondary | ICD-10-CM | POA: Diagnosis not present

## 2020-06-20 DIAGNOSIS — J309 Allergic rhinitis, unspecified: Secondary | ICD-10-CM

## 2020-06-20 DIAGNOSIS — H5213 Myopia, bilateral: Secondary | ICD-10-CM | POA: Diagnosis not present

## 2020-06-20 LAB — POCT URINALYSIS DIP (MANUAL ENTRY)
Bilirubin, UA: NEGATIVE
Blood, UA: NEGATIVE
Glucose, UA: NEGATIVE mg/dL
Nitrite, UA: NEGATIVE
Spec Grav, UA: 1.02 (ref 1.010–1.025)
Urobilinogen, UA: 0.2 E.U./dL
pH, UA: 7 (ref 5.0–8.0)

## 2020-06-20 LAB — URINALYSIS, MICROSCOPIC ONLY: RBC / HPF: NONE SEEN (ref 0–?)

## 2020-06-20 LAB — POCT URINE PREGNANCY: Preg Test, Ur: NEGATIVE

## 2020-06-20 NOTE — Progress Notes (Signed)
Virtual Visit via video Note  This visit type was conducted due to national recommendations for restrictions regarding the COVID-19 pandemic (e.g. social distancing).  This format is felt to be most appropriate for this patient at this time.  All issues noted in this document were discussed and addressed.  No physical exam was performed (except for noted visual exam findings with Video Visits).   I connected with Jillian Anderson today at  1:45 PM EST by a video enabled telemedicine application and verified that I am speaking with the correct person using two identifiers. Location patient: car Location provider: work  Persons participating in the virtual visit: patient, provider  I discussed the limitations, risks, security and privacy concerns of performing an evaluation and management service by telephone and the availability of in person appointments. I also discussed with the patient that there may be a patient responsible charge related to this service. The patient expressed understanding and agreed to proceed.  Reason for visit: f/u.  HPI: Kidney stones: Patient does have a history of kidney stones in the past.  Typically when she would pass them the symptoms would resolve.  She notes about 5 days ago she developed some flank pain and discomfort when urinating with discomfort and swelling in her back.  She went to the emergency department though passed 2 kidney stones while there and subsequently left.  She has continued to have right flank pain and discomfort when urinating despite passing the stones.  Occasional lower abdominal discomfort that is sharp.  She has been on Macrobid for possible UTI as well.  She notes not urinating as larger volume is typical.  Has an IUD in place.  No hematuria.  Rhinorrhea: Notes this typically occurs when the weather changes.  Its chronic intermittent issue.  She did have Covid in December.  She is not vaccinated against COVID-19.  No other significant  symptoms.   ROS: See pertinent positives and negatives per HPI.  Past Medical History:  Diagnosis Date  . Allergy   . Asthma without status asthmaticus 03/15/2016  . Astigmatism   . COVID-19 04/2020  . GERD (gastroesophageal reflux disease)   . Gestational diabetes   . History of kidney stones    In the past  . IUGR (intrauterine growth restriction) affecting care of mother 11/03/2017  . Migraines   . Nephrolithiasis 09/03/2011  . PCOS (polycystic ovarian syndrome)   . Polycystic disease, ovaries   . Polycystic ovarian syndrome 03/15/2016  . Pre-eclampsia affecting pregnancy, antepartum 11/01/2017  . Tachycardia   . Tachycardia     Past Surgical History:  Procedure Laterality Date  . DILATION AND EVACUATION N/A 04/23/2016   Procedure: DILATATION AND EVACUATION;  Surgeon: Herold Harms, MD;  Location: [facility] ORS;  Service: Gynecology;  Laterality: N/A;  . WISDOM TOOTH EXTRACTION      Family History  Problem Relation Age of Onset  . Hypertension Mother   . Hyperlipidemia Father   . Diabetes Father   . Asthma Brother   . Breast cancer Neg Hx   . Ovarian cancer Neg Hx   . Colon cancer Neg Hx     SOCIAL HX: nonsmoker   Current Outpatient Medications:  .  levonorgestrel (MIRENA) 20 MCG/24HR IUD, 1 each by Intrauterine route once. Inserted 06/26/2019, Disp: , Rfl:  .  loratadine (CLARITIN) 10 MG tablet, Take 10 mg by mouth as needed. , Disp: , Rfl:  .  nitrofurantoin, macrocrystal-monohydrate, (MACROBID) 100 MG capsule, Take 1 capsule (100 mg  total) by mouth 2 (two) times daily., Disp: 14 capsule, Rfl: 0 .  Prenatal Vit-Fe Fumarate-FA (PRENATAL MULTIVITAMIN) TABS tablet, Take 1 tablet by mouth daily at 12 noon., Disp: , Rfl:  .  promethazine (PHENERGAN) 12.5 MG tablet, Take 1 tablet (12.5 mg total) by mouth every 6 (six) hours as needed for nausea or vomiting., Disp: 15 tablet, Rfl: 0 .  metroNIDAZOLE (FLAGYL) 500 MG tablet, Take 1 tablet (500 mg total) by mouth 2 (two)  times daily. (Patient not taking: Reported on 06/20/2020), Disp: 14 tablet, Rfl: 0  EXAM:  VITALS per patient if applicable:  GENERAL: alert, oriented, appears well and in no acute distress  HEENT: atraumatic, conjunttiva clear, no obvious abnormalities on inspection of external nose and ears  NECK: normal movements of the head and neck  LUNGS: on inspection no signs of respiratory distress, breathing rate appears normal, no obvious gross SOB, gasping or wheezing  CV: no obvious cyanosis  MS: moves all visible extremities without noticeable abnormality  PSYCH/NEURO: pleasant and cooperative, no obvious depression or anxiety, speech and thought processing grossly intact  ASSESSMENT AND PLAN:  Discussed the following assessment and plan:  Problem List Items Addressed This Visit    Allergic rhinitis    Chronic intermittent issue with weather changes.  She notes no other symptoms.  She will monitor.      Kidney stone    Patient passed multiple kidney stones.  She continues to have flank pain and back discomfort.  We will proceed with imaging.  Urine will be sent for culture and microscopy.  Urine pregnancy test to be completed as well.  Dye allergy precludes the use of Flomax.  She will seek medical attention for inability to urinate or uncontrollable pain.       Other Visit Diagnoses    History of kidney stones    -  Primary   Relevant Orders   POCT urinalysis dipstick (Completed)   Stone analysis   Abdominal pain, unspecified abdominal location       Relevant Orders   CT RENAL STONE STUDY   Urine Culture   Urine Microscopic   POCT urine pregnancy (Completed)       I discussed the assessment and treatment plan with the patient. The patient was provided an opportunity to ask questions and all were answered. The patient agreed with the plan and demonstrated an understanding of the instructions.   The patient was advised to call back or seek an in-person evaluation if the  symptoms worsen or if the condition fails to improve as anticipated   Jillian Alar, MD

## 2020-06-20 NOTE — Assessment & Plan Note (Signed)
Chronic intermittent issue with weather changes.  She notes no other symptoms.  She will monitor.

## 2020-06-20 NOTE — Assessment & Plan Note (Addendum)
Patient passed multiple kidney stones.  She continues to have flank pain and back discomfort.  We will proceed with imaging.  Urine will be sent for culture and microscopy.  Urine pregnancy test to be completed as well.  Dye allergy precludes the use of Flomax.  She will seek medical attention for inability to urinate or uncontrollable pain.

## 2020-06-21 LAB — URINE CULTURE
MICRO NUMBER:: 11552572
SPECIMEN QUALITY:: ADEQUATE

## 2020-06-25 LAB — STONE ANALYSIS: Stone Weight: 0.175 g

## 2020-06-26 ENCOUNTER — Telehealth: Payer: Self-pay | Admitting: Family Medicine

## 2020-06-26 NOTE — Telephone Encounter (Signed)
I called the patient back and informed her of her kidney stone results and she understood.   Jillian Anderson,cma

## 2020-06-27 ENCOUNTER — Other Ambulatory Visit: Payer: Self-pay

## 2020-06-27 ENCOUNTER — Ambulatory Visit
Admission: RE | Admit: 2020-06-27 | Discharge: 2020-06-27 | Disposition: A | Discharge status: Home / Self Care | Payer: Medicaid Other | Source: Ambulatory Visit | Attending: Family Medicine | Admitting: Family Medicine

## 2020-06-27 ENCOUNTER — Other Ambulatory Visit: Payer: Self-pay | Admitting: Obstetrics and Gynecology

## 2020-06-27 DIAGNOSIS — N200 Calculus of kidney: Secondary | ICD-10-CM | POA: Diagnosis not present

## 2020-06-27 DIAGNOSIS — N83201 Unspecified ovarian cyst, right side: Secondary | ICD-10-CM

## 2020-06-27 DIAGNOSIS — R109 Unspecified abdominal pain: Secondary | ICD-10-CM | POA: Diagnosis not present

## 2020-06-30 ENCOUNTER — Other Ambulatory Visit: Payer: Self-pay | Admitting: Family Medicine

## 2020-06-30 DIAGNOSIS — N200 Calculus of kidney: Secondary | ICD-10-CM

## 2020-07-04 ENCOUNTER — Encounter: Payer: Self-pay | Admitting: Urology

## 2020-07-04 ENCOUNTER — Other Ambulatory Visit: Payer: Self-pay

## 2020-07-04 ENCOUNTER — Ambulatory Visit (INDEPENDENT_AMBULATORY_CARE_PROVIDER_SITE_OTHER): Payer: Medicaid Other | Admitting: Urology

## 2020-07-04 VITALS — BP 96/67 | HR 102 | Ht 62.0 in | Wt 166.0 lb

## 2020-07-04 DIAGNOSIS — N200 Calculus of kidney: Secondary | ICD-10-CM

## 2020-07-04 LAB — URINALYSIS, COMPLETE
Bilirubin, UA: NEGATIVE
Glucose, UA: NEGATIVE
Ketones, UA: NEGATIVE
Nitrite, UA: NEGATIVE
Protein,UA: NEGATIVE
RBC, UA: NEGATIVE
Specific Gravity, UA: 1.02 (ref 1.005–1.030)
Urobilinogen, Ur: 0.2 mg/dL (ref 0.2–1.0)
pH, UA: 7 (ref 5.0–7.5)

## 2020-07-04 LAB — MICROSCOPIC EXAMINATION

## 2020-07-04 NOTE — Patient Instructions (Signed)

## 2020-07-04 NOTE — Progress Notes (Signed)
07/04/2020 2:06 PM   Jillian Anderson 11/16/1985 [id number]  Referring provider: Glori Luis, MD [address] [place],  [place] [address]  Chief Complaint  Patient presents with   Nephrolithiasis    HPI: Jillian Anderson is a 35 y.o. female who presents for evaluation of nephrolithiasis.   Presented to [facility] 06/11/2020 with a 3-day history of mid upper abdominal pain associated with nausea, vomiting and diarrhea  Urinalysis was nitrite positive with pyuria though it was felt this was a contaminated sample (21-50 squamous epis) and was not treated  Symptoms improved with IV fluids and Phenergan  Urine culture had insignificant growth  Return to the ED 06/15/2020 complaining of right flank pain  Urinalysis did show 11-20 RBC/11-20 WBC and was nitrite positive  While waiting to be seen in the ED she passed 2 stones with resolution of her pain and left without being seen  Virtual visit with Dr. Birdie Sons 06/20/2020 with complaints of persistent right flank pain with dysuria and lower abdominal discomfort  Dipstick UA ordered which showed large leukocytes; urine culture grew mixed flora  She was able to save her stones and they were sent for analysis with results returning 20% uric acid/80% carbonate apatite  Prior history of stones  Stone protocol CT performed 06/27/2020 showed punctate bilateral, nonobstructing renal calculi, no ureteral calculi seen.  No hydronephrosis present  She did have a 6.2 x 5.1 cm right adnexal cyst and GYN follow-up as scheduled  Does complain of persistent bilateral back pain and tenderness   PMH: Past Medical History:  Diagnosis Date   Allergy    Asthma without status asthmaticus 03/15/2016   Astigmatism    COVID-19 04/2020   GERD (gastroesophageal reflux disease)    Gestational diabetes    History of kidney stones    In the past   IUGR (intrauterine growth restriction) affecting care of mother 11/03/2017    Migraines    Nephrolithiasis 09/03/2011   PCOS (polycystic ovarian syndrome)    Polycystic disease, ovaries    Polycystic ovarian syndrome 03/15/2016   Pre-eclampsia affecting pregnancy, antepartum 11/01/2017   Tachycardia    Tachycardia     Surgical History: Past Surgical History:  Procedure Laterality Date   DILATION AND EVACUATION N/A 04/23/2016   Procedure: DILATATION AND EVACUATION;  Surgeon: Herold Harms, MD;  Location: [facility] ORS;  Service: Gynecology;  Laterality: N/A;   WISDOM TOOTH EXTRACTION      Home Medications:  Allergies as of 07/04/2020      Reactions   Ceclor [cefaclor]    hives   Codeine    Body fights itself eyes dilate   Red Dye    Hives, nausea and migraines   Blackberry Flavor Nausea And Vomiting, Rash   Raspberry Nausea And Vomiting, Rash      Medication List       Accurate as of July 04, 2020 11:59 PM. If you have any questions, ask your nurse or doctor.        STOP taking these medications   metroNIDAZOLE 500 MG tablet Commonly known as: FLAGYL Stopped by: Riki Altes, MD   nitrofurantoin (macrocrystal-monohydrate) 100 MG capsule Commonly known as: MACROBID Stopped by: Riki Altes, MD     TAKE these medications   levonorgestrel 20 MCG/24HR IUD Commonly known as: MIRENA 1 each by Intrauterine route once. Inserted 06/26/2019   loratadine 10 MG tablet Commonly known as: CLARITIN Take 10 mg by mouth as needed.   prenatal  multivitamin Tabs tablet Take 1 tablet by mouth daily at 12 noon.   promethazine 12.5 MG tablet Commonly known as: PHENERGAN Take 1 tablet (12.5 mg total) by mouth every 6 (six) hours as needed for nausea or vomiting.       Allergies:  Allergies  Allergen Reactions   Ceclor [Cefaclor]     hives   Codeine     Body fights itself eyes dilate   Red Dye     Hives, nausea and migraines    Blackberry Flavor Nausea And Vomiting and Rash   Raspberry Nausea And Vomiting and Rash     Family History: Family History  Problem Relation Age of Onset   Hypertension Mother    Hyperlipidemia Father    Diabetes Father    Asthma Brother    Breast cancer Neg Hx    Ovarian cancer Neg Hx    Colon cancer Neg Hx     Social History:  reports that she has never smoked. She has never used smokeless tobacco. She reports current alcohol use of about 1.0 standard drink of alcohol per week. She reports that she does not use drugs.   Physical Exam: BP 96/67    Pulse (!) 102    Ht 5\' 2"  (1.575 m)    Wt 166 lb (75.3 kg)    LMP 03/27/2020 (LMP Unknown)    BMI 30.36 kg/m   Constitutional:  Alert and oriented, No acute distress. HEENT: [place] AT, moist mucus membranes.  Trachea midline, no masses. Cardiovascular: No clubbing, cyanosis, or edema. Respiratory: Normal respiratory effort, no increased work of breathing. Neurologic: Grossly intact, no focal deficits, moving all 4 extremities. Psychiatric: Normal mood and affect.   Pertinent Imaging: CT images personally reviewed and interpreted  CT RENAL STONE STUDY  Narrative CLINICAL DATA:  Persistent right flank and lower abdominal pain. Past 2 renal stones 2 weeks prior.  EXAM: CT ABDOMEN AND PELVIS WITHOUT CONTRAST  TECHNIQUE: Multidetector CT imaging of the abdomen and pelvis was performed following the standard protocol without IV contrast.  COMPARISON:  06/11/2020 abdominal sonogram. 12/30/2011 CT abdomen/pelvis.  FINDINGS: Lower chest: No significant pulmonary nodules or acute consolidative airspace disease.  Hepatobiliary: Normal liver size. No liver mass. Normal gallbladder with no radiopaque cholelithiasis. No biliary ductal dilatation.  Pancreas: Normal, with no mass or duct dilation.  Spleen: Normal size. No mass.  Adrenals/Urinary Tract: Normal adrenals. Several punctate 1 mm nonobstructing stones scattered throughout both kidneys. No hydronephrosis. No contour deforming renal masses. Normal  caliber ureters. No ureteral stones. Normal bladder.  Stomach/Bowel: Normal stomach filled with food debris. Normal caliber small bowel with no small bowel wall thickening. Normal appendix. Normal large bowel with no diverticulosis, large bowel wall thickening or pericolonic fat stranding.  Vascular/Lymphatic: Minimally atherosclerotic nonaneurysmal abdominal aorta. No pathologically enlarged lymph nodes in the abdomen or pelvis.  Reproductive: Simple 6.2 x 5.1 cm right adnexal cyst (series 2/image 71). No left adnexal mass. Intrauterine device in the central uterus, with possible myometrial penetration by anterior side arm of the device (series 2/image 73).  Other: No pneumoperitoneum, ascites or focal fluid collection.  Musculoskeletal: No aggressive appearing focal osseous lesions. Limbus vertebra in the anterior inferior L5 vertebral body.  IMPRESSION: 1. Punctate nonobstructing bilateral nephrolithiasis. No hydronephrosis. No ureteral or bladder stones. 2. Simple 6.2 cm right adnexal cyst. Because this lesion is not adequately characterized, prompt 01/01/2012 is recommended for further evaluation. Note: This recommendation does not apply to premenarchal patients and to those with increased  risk (genetic, family history, elevated tumor markers or other high-risk factors) of ovarian cancer. Reference: JACR 2020 Feb; 17(2):248-254 3. Intrauterine device in the central uterus, with possible myometrial penetration by anterior side arm of the device. Suggest correlation with transvaginal pelvic ultrasound. 4. Aortic Atherosclerosis (ICD10-I70.0).  These results will be called to the ordering clinician or representative by the Radiology Department at the imaging location.   Electronically Signed By: Delbert Phenix M.D. On: 06/27/2020 16:41   Assessment & Plan:    1.  Passed ureteral calculi  Stone composition not commonly seen  Have recommended a metabolic evaluation with  [facility] to include a 24-hour urine study and blood work.  Instructed to continue her usual diet and fluid intake  2.  Bilateral nephrolithiasis  Punctate bilateral calculi, no hydronephrosis or other abnormalities identified  No etiology of her back pain seen from a urologic standpoint and this may be musculoskeletal    Riki Altes, MD  [facility] [address], [address] [place], [place] [address] [phone]

## 2020-07-05 ENCOUNTER — Encounter: Payer: Self-pay | Admitting: Urology

## 2020-07-10 ENCOUNTER — Telehealth: Payer: Self-pay | Admitting: *Deleted

## 2020-07-10 LAB — CULTURE, URINE COMPREHENSIVE

## 2020-07-10 NOTE — Telephone Encounter (Signed)
-----   Message from Riki Altes, MD sent at 07/10/2020  8:25 AM EST ----- Urine culture was negative for infection

## 2020-07-10 NOTE — Telephone Encounter (Signed)
Left message on voice mail per DPR .  °

## 2020-07-11 ENCOUNTER — Other Ambulatory Visit: Payer: Self-pay

## 2020-07-11 ENCOUNTER — Ambulatory Visit
Admission: RE | Admit: 2020-07-11 | Discharge: 2020-07-11 | Disposition: A | Discharge status: Home / Self Care | Payer: Medicaid Other | Source: Ambulatory Visit | Attending: Obstetrics and Gynecology | Admitting: Obstetrics and Gynecology

## 2020-07-11 DIAGNOSIS — N83201 Unspecified ovarian cyst, right side: Secondary | ICD-10-CM | POA: Diagnosis not present

## 2020-07-11 DIAGNOSIS — N838 Other noninflammatory disorders of ovary, fallopian tube and broad ligament: Secondary | ICD-10-CM | POA: Diagnosis not present

## 2020-07-14 ENCOUNTER — Other Ambulatory Visit: Payer: Self-pay

## 2020-07-14 ENCOUNTER — Other Ambulatory Visit: Payer: Medicaid Other

## 2020-07-14 DIAGNOSIS — N200 Calculus of kidney: Secondary | ICD-10-CM | POA: Diagnosis not present

## 2020-07-16 DIAGNOSIS — N83201 Unspecified ovarian cyst, right side: Secondary | ICD-10-CM

## 2020-07-18 MED ORDER — NORGESTIMATE-ETH ESTRADIOL 0.25-35 MG-MCG PO TABS: 1.0000 | ORAL_TABLET | Freq: Every day | ORAL | 0 refills | Status: DC

## 2020-07-28 ENCOUNTER — Other Ambulatory Visit: Payer: Self-pay | Admitting: Urology

## 2020-07-28 ENCOUNTER — Telehealth: Payer: Self-pay | Admitting: Urology

## 2020-07-28 NOTE — Telephone Encounter (Signed)
Left message to call back to schedule her [facility] results app Offer her a telephone app for an 8:00 or and OV anytime that works for her.  Thanks, Marcelino Duster

## 2020-08-06 ENCOUNTER — Other Ambulatory Visit: Payer: Self-pay | Admitting: Obstetrics and Gynecology

## 2020-08-06 ENCOUNTER — Other Ambulatory Visit: Payer: Self-pay

## 2020-08-06 ENCOUNTER — Telehealth: Payer: Medicaid Other | Admitting: Urology

## 2020-08-06 ENCOUNTER — Ambulatory Visit
Admission: RE | Admit: 2020-08-06 | Discharge: 2020-08-06 | Disposition: A | Discharge status: Home / Self Care | Payer: Medicaid Other | Source: Ambulatory Visit | Attending: Obstetrics and Gynecology | Admitting: Obstetrics and Gynecology

## 2020-08-06 DIAGNOSIS — N83201 Unspecified ovarian cyst, right side: Secondary | ICD-10-CM

## 2020-08-08 ENCOUNTER — Telehealth (INDEPENDENT_AMBULATORY_CARE_PROVIDER_SITE_OTHER): Payer: Medicaid Other | Admitting: Urology

## 2020-08-08 ENCOUNTER — Other Ambulatory Visit: Payer: Self-pay

## 2020-08-08 ENCOUNTER — Encounter: Payer: Self-pay | Admitting: Family Medicine

## 2020-08-08 DIAGNOSIS — N202 Calculus of kidney with calculus of ureter: Secondary | ICD-10-CM | POA: Diagnosis not present

## 2020-08-08 NOTE — Progress Notes (Signed)
Virtual Visit via Telephone Note  I connected with Jillian Anderson on 08/08/20 at  8:30 AM EDT by telephone and verified that I am speaking with the correct person using two identifiers.  Location: Patient: Home Provider:  Urological Participants: Patient and myself   I discussed the limitations, risks, security and privacy concerns of performing an evaluation and management service by telephone and the availability of in person appointments. I also discussed with the patient that there may be a patient responsible charge related to this service. The patient expressed understanding and agreed to proceed.   History of Present Illness: Refer to prior note 07/04/2020.  Follow-up to review metabolic evaluation.  Since last visit occasional twinges of mild flank pain        Observations/Objective: Alert, conversant  Assessment and Plan:  1.  Bilateral nephrolithiasis  Metabolic evaluation remarkable for adequate urine volume though would increase to keep urine output between 2-2.5 L/day  Mild hypocitraturia and we discussed increasing dietary citrus with limited use lemonade being the most effective  High urine pH though stone analysis was not calcium phosphate; urine calcium was normal  Follow Up Instructions:  26-month follow-up with [facility]   I discussed the assessment and treatment plan with the patient. The patient was provided an opportunity to ask questions and all were answered. The patient agreed with the plan and demonstrated an understanding of the instructions.   The patient was advised to call back or seek an in-person evaluation if the symptoms worsen or if the condition fails to improve as anticipated.  I provided 10 minutes of non-face-to-face time during this encounter.   Riki Altes, MD

## 2020-08-10 ENCOUNTER — Encounter: Payer: Self-pay | Admitting: Urology

## 2020-11-05 ENCOUNTER — Encounter: Payer: Self-pay | Admitting: Obstetrics and Gynecology

## 2020-11-05 ENCOUNTER — Ambulatory Visit (INDEPENDENT_AMBULATORY_CARE_PROVIDER_SITE_OTHER): Payer: Medicaid Other | Admitting: Obstetrics and Gynecology

## 2020-11-05 ENCOUNTER — Other Ambulatory Visit: Payer: Self-pay

## 2020-11-05 VITALS — BP 119/76 | HR 86 | Ht 62.0 in | Wt 177.7 lb

## 2020-11-05 DIAGNOSIS — Z975 Presence of (intrauterine) contraceptive device: Secondary | ICD-10-CM | POA: Diagnosis not present

## 2020-11-05 DIAGNOSIS — Z3009 Encounter for other general counseling and advice on contraception: Secondary | ICD-10-CM | POA: Diagnosis not present

## 2020-11-05 NOTE — Progress Notes (Signed)
Pt present to discuss surgery and have a tubal ligation.

## 2020-11-05 NOTE — Progress Notes (Signed)
    GYNECOLOGY PROGRESS NOTE  Subjective:    Patient ID: Jillian Anderson, female    DOB: [birth year], 35 y.o.   MRN: [id number]  HPI  Patient is a 35 y.o. [id number] female who presents for discussion of tubal ligation.  Currently has a Mirena IUD in place, but notes that she no longer desires fertility.   The following portions of the patient's history were reviewed and updated as appropriate: allergies, current medications, past family history, past medical history, past social history, past surgical history, and problem list.  Review of Systems Pertinent items noted in HPI and remainder of comprehensive ROS otherwise negative.   Objective:   Blood pressure 119/76, pulse 86, height 5\' 2"  (1.575 m), weight 177 lb 11.2 oz (80.6 kg), currently breastfeeding. General appearance: alert and no distress Abdomen: soft, non-tender; bowel sounds normal; no masses,  no organomegaly Remainder of exam deferred.    Assessment:   Undesired fertility IUD in place  Plan:   Contraception counseling: Patient desires permanent sterilization.  Other reversible forms of contraception were discussed with patient; she declines all other modalities. Risks of procedure discussed with patient including but not limited to: risk of regret, permanence of method, bleeding, infection, injury to surrounding organs and need for additional procedures.  Failure risk of about 1% with increased risk of ectopic gestation if pregnancy occurs was also discussed with patient.  Also discussed possibility of post-tubal pain syndrome. Patient verbalized understanding of these risks and wants to proceed with sterilization.  Briefly reviewed nature of the procedure and risks associated. Would be performed laparoscopically. Discussed recovery period. Will discuss in more detail at pre-op appointment. Medicaid sterilization form signed today.  Advised that form must be compliant for at least 30 days before procedure can be performed.  Patient notes understanding.  IUD in place. Can remove at time of tubal ligation, or can remain in place for management of menstrual cycles.  Has only had IUD in place x 1 year. To discuss next visit.   Follow up in 4 weeks for pre-op visit.   A total of 15 minutes were spent face-to-face with the patient during this encounter and over half of that time dealt with counseling and coordination of care.   , MD [facility]

## 2020-11-05 NOTE — Patient Instructions (Signed)
Laparoscopic Tubal Ligation Laparoscopic tubal ligation is a procedure to close the fallopian tubes. This is done to prevent pregnancy. When the fallopian tubes are closed, the eggs that your ovaries release cannot enter the uterus, and sperm cannot reach thereleased eggs. You should not have this procedure if you want to get pregnant someday or ifyou are unsure about having more children. Tell a health care provider about: Any allergies you have. All medicines you are taking, including vitamins, herbs, eye drops, creams, and over-the-counter medicines. Any problems you or family members have had with anesthetic medicines. Any blood disorders you have. Any surgeries you have had. Any medical conditions you have. Whether you are pregnant or may be pregnant. Any past pregnancies. What are the risks? Generally, this is a safe procedure. However, problems may occur, including: Infection. Bleeding. Injury to other organs in the abdomen. Side effects from anesthetic medicines. Failure of the procedure. This procedure can increase your risk of an ectopic pregnancy. This is apregnancy in which a fertilized egg attaches to the outside of the uterus. What happens before the procedure? Staying hydrated Follow instructions from your health care provider about hydration, which may include: Up to 2 hours before the procedure - you may continue to drink clear liquids, such as water, clear fruit juice, black coffee, and plain tea. Eating and drinking restrictions Follow instructions from your health care provider about eating and drinking, which may include: 8 hours before the procedure - stop eating heavy meals or foods, such as meat, fried foods, or fatty foods. 6 hours before the procedure - stop eating light meals or foods, such as toast or cereal. 6 hours before the procedure - stop drinking milk or drinks that contain milk. 2 hours before the procedure - stop drinking clear  liquids. Medicines Ask your health care provider about: Changing or stopping your regular medicines. This is especially important if you are taking diabetes medicines or blood thinners. Taking medicines such as aspirin and ibuprofen. These medicines can thin your blood. Do not take these medicines unless your health care provider tells you to take them. Taking over-the-counter medicines, vitamins, herbs, and supplements. Surgery safety Ask your health care provider: How your surgery site will be marked. What steps will be taken to help prevent infection. These steps may include: Removing hair at the surgery site. Washing skin with a germ-killing soap. Taking antibiotic medicine. General instructions Do not use any products that contain nicotine or tobacco for at least 4 weeks before the procedure. These products include cigarettes, chewing tobacco, and vaping devices, such as e-cigarettes. If you need help quitting, ask your health care provider. Plan to have someone take you home from the hospital. If you will be going home right after the procedure, plan to have a responsible adult care for you for the time you are told. This is important. What happens during the procedure?     An IV will be inserted into one of your veins. You will be given one or more of the following: A medicine to help you relax (sedative). A medicine to numb the area (local anesthetic). A medicine to make you fall asleep (general anesthetic). A medicine that is injected into an area of your body to numb everything below the injection site (regional anesthetic). Your bladder may be emptied with a small tube (catheter). If you have been given a general anesthetic, a tube will be put down your throat to help you breathe. Two small incisions will be made in   your lower abdomen and near your belly button. Your abdomen will be inflated with a gas. This will let the surgeon see better and will give the surgeon room to  work. A lighted tube with camera (laparoscope) will be inserted into your abdomen through one of the incisions. Small instruments will be inserted through the other incision. The fallopian tubes will be tied off, burned (cauterized), or blocked with a clip, ring, or clamp. A small portion in the center of each fallopian tube may be removed. The gas will be released from the abdomen. The incisions will be closed with stitches (sutures). A bandage (dressing) will be placed over the incisions. The procedure may vary among health care providers and hospitals. What happens after the procedure? Your blood pressure, heart rate, breathing rate, and blood oxygen level will be monitored until you leave the hospital. You will be given medicine to help with pain, nausea, and vomiting as needed. You may have vaginal discharge after the procedure. You may need to wear a sanitary napkin. If you were given a sedative during the procedure, it can affect you for several hours. Do not drive or operate machinery until your health care provider says that it is safe. Summary Laparoscopic tubal ligation is a procedure that is done to prevent pregnancy. You should not have this procedure if you want to get pregnant someday or if you are unsure about having more children. The procedure is done using a thin, lighted tube (laparoscope) with a camera attached that will be inserted into your abdomen through an incision. After the procedure you will be given medicine to help with pain, nausea, and vomiting as needed. Plan to have someone take you home from the hospital. This information is not intended to replace advice given to you by your health care provider. Make sure you discuss any questions you have with your healthcare provider. Document Revised: 01/04/2020 Document Reviewed: 01/04/2020 Elsevier Patient Education  2022 [employer]  

## 2020-11-14 ENCOUNTER — Ambulatory Visit: Payer: Medicaid Other | Admitting: Family Medicine

## 2020-12-03 ENCOUNTER — Ambulatory Visit: Payer: Medicaid Other | Admitting: Obstetrics and Gynecology

## 2020-12-03 ENCOUNTER — Encounter: Payer: Self-pay | Admitting: Obstetrics and Gynecology

## 2020-12-03 ENCOUNTER — Other Ambulatory Visit: Payer: Self-pay

## 2020-12-03 ENCOUNTER — Encounter: Payer: Medicaid Other | Admitting: Obstetrics and Gynecology

## 2020-12-03 VITALS — BP 105/74 | HR 102 | Resp 16 | Ht 62.0 in | Wt 181.2 lb

## 2020-12-03 DIAGNOSIS — Z01818 Encounter for other preprocedural examination: Secondary | ICD-10-CM

## 2020-12-03 DIAGNOSIS — Z975 Presence of (intrauterine) contraceptive device: Secondary | ICD-10-CM

## 2020-12-03 NOTE — Patient Instructions (Signed)
GYNECOLOGY PRE-OPERATIVE INSTRUCTIONS  You are scheduled for surgery on December 25, 2020.  The name of your procedure is: Bilateral Tubal Ligation and IUD Removal.   Please read through these instructions carefully regarding preparation for your surgery: Nothing to eat after midnight on the day prior to surgery.  Do not take any medications unless recommended by your provider on day prior to surgery.  Do not take NSAIDs (Motrin, Aleve) or aspirin 7 days prior to surgery.  You may take Tylenol products for minor aches and pains.  You will receive a prescription for pain medications post-operatively.  You will be contacted by phone approximately 1-2 weeks prior to surgery to schedule your pre-operative appointment.  If you are being admitted to the hospital for an overnight stay, you will require COVID testing prior to surgery. Instructions will be given to you on when and where to have this performed.  Please call the office if you have any questions regarding your upcoming surgery.    Thank you for choosing [facility].

## 2020-12-03 NOTE — Progress Notes (Signed)
    GYNECOLOGY PROGRESS NOTE  Subjective:    Patient ID: Jillian Anderson, female    DOB: 08-29-1985, 35 y.o.   MRN: [id number]  HPI  Patient is a 35 y.o. [id number] female who presents for pre op for Tubal Ligation.  Patient currently has a Mirena IUD in place.  Chief Complaints: No concerns  The following portions of the patient's history were reviewed and updated as appropriate: allergies, current medications, past family history, past medical history, past social history, past surgical history, and problem list.  Review of Systems Pertinent items noted in HPI and remainder of comprehensive ROS otherwise negative.   Objective:   Blood pressure 105/74, pulse (!) 102, resp. rate 16, height 5\' 2"  (1.575 m), weight 181 lb 3.2 oz (82.2 kg), last menstrual period 10/30/2019, currently breastfeeding. There is no height or weight on file to calculate BMI. General appearance: alert and no distress Abdomen: soft, non-tender; bowel sounds normal; no masses,  no organomegaly See H&P for remainder of exam.    Assessment:   1. Pre-operative evaluation for tubal ligation   2. IUD (intrauterine device) in place      Plan:   Patient previously counseled on alternatives to permanent sterilization.  Still desires to proceed with tubal ligation.  We will schedule for December 25, 2020.  Discussed caution of retaining IUD for management of her menstrual cycles versus removal.  Patient desires removal at time of surgery.    December 27, 2020, MD [facility]

## 2020-12-03 NOTE — H&P (View-Only) (Signed)
GYNECOLOGY PREOPERATIVE HISTORY AND PHYSICAL   Subjective:  Jillian Anderson is a 35 y.o. [id number] here for surgical management of undesired fertility.  Currently has Mirena IUD in place. No significant preoperative concerns.  Proposed surgery: Removal of IUD, Bilateral Tubal Ligation   Pertinent Gynecological History: Patient's last menstrual period was 10/30/2019 (approximate). Contraception: IUD Last pap: normal Date: 05/26/2017.    Past Medical History:  Diagnosis Date   Allergy    Asthma without status asthmaticus 03/15/2016   Astigmatism    COVID-19 04/2020   GERD (gastroesophageal reflux disease)    Gestational diabetes    History of kidney stones    In the past   IUGR (intrauterine growth restriction) affecting care of mother 11/03/2017   Migraines    Nephrolithiasis 09/03/2011   PCOS (polycystic ovarian syndrome)    Polycystic disease, ovaries    Polycystic ovarian syndrome 03/15/2016   Pre-eclampsia affecting pregnancy, antepartum 11/01/2017   Tachycardia    Tachycardia     Past Surgical History:  Procedure Laterality Date   DILATION AND EVACUATION N/A 04/23/2016   Procedure: DILATATION AND EVACUATION;  Surgeon: Herold Harms, MD;  Location: [facility];  Service: Gynecology;  Laterality: N/A;   WISDOM TOOTH EXTRACTION      OB History  Gravida Para Term Preterm AB Living  3 2 1 1 1 2   SAB IAB Ectopic Multiple Live Births  1     0 2    # Outcome Date GA Lbr Len/2nd Weight Sex Delivery Anes PTL Lv  3 Term 05/15/19 [redacted]w[redacted]d  6 lb 8.4 oz (2.96 kg) M Vag-Spont None N LIV     Complications: Hypertension, GDM (gestational diabetes mellitus)  2 Preterm 11/05/17 [redacted]w[redacted]d / 00:07 4 lb 10.8 oz (2.12 kg) M Vag-Spont Local Y LIV     Complications: Preeclampsia  1 SAB             Family History  Problem Relation Age of Onset   Hypertension Mother    Hyperlipidemia Father    Diabetes Father    Asthma Brother    Breast cancer Neg Hx    Ovarian cancer Neg Hx     Colon cancer Neg Hx     Social History   Socioeconomic History   Marital status: Married    Spouse name: Not on file   Number of children: Not on file   Years of education: Not on file   Highest education level: Not on file  Occupational History   Not on file  Tobacco Use   Smoking status: Never   Smokeless tobacco: Never  Vaping Use   Vaping Use: Never used  Substance and Sexual Activity   Alcohol use: Yes    Alcohol/week: 1.0 standard drink    Types: 1 Glasses of wine per week    Comment: occassionally    Drug use: No   Sexual activity: Yes    Birth control/protection: I.U.D.    Comment: inserted 06/26/2019  Other Topics Concern   Not on file  Social History Narrative   Not on file   Social Determinants of Health   Financial Resource Strain: Not on file  Food Insecurity: Not on file  Transportation Needs: Not on file  Physical Activity: Not on file  Stress: Not on file  Social Connections: Not on file  Intimate Partner Violence: Not on file    Current Outpatient Medications on File Prior to Visit  Medication Sig Dispense Refill   levonorgestrel (  MIRENA) 20 MCG/24HR IUD 1 each by Intrauterine route once. Inserted 06/26/2019     loratadine (CLARITIN) 10 MG tablet Take 10 mg by mouth as needed.      Prenatal Vit-Fe Fumarate-FA (PRENATAL MULTIVITAMIN) TABS tablet Take 1 tablet by mouth daily at 12 noon.     promethazine (PHENERGAN) 12.5 MG tablet Take 1 tablet (12.5 mg total) by mouth every 6 (six) hours as needed for nausea or vomiting. 15 tablet 0   No current facility-administered medications on file prior to visit.   Allergies  Allergen Reactions   Ceclor [Cefaclor]     hives   Codeine     Body fights itself eyes dilate   Red Dye     Hives, nausea and migraines    Blackberry Flavor Nausea And Vomiting and Rash   Raspberry Nausea And Vomiting and Rash     Review of Systems Constitutional: No recent fever/chills/sweats Respiratory: No recent  cough/bronchitis Cardiovascular: No chest pain Gastrointestinal: No recent nausea/vomiting/diarrhea Genitourinary: No UTI symptoms Hematologic/lymphatic:No history of coagulopathy or recent blood thinner use    Objective:   Blood pressure 105/74, pulse (!) 102, resp. rate 16, height 5\' 2"  (1.575 m), weight 181 lb 3.2 oz (82.2 kg), last menstrual period 10/30/2019, currently breastfeeding. CONSTITUTIONAL: Well-developed, well-nourished female in no acute distress.  HENT:  Normocephalic, atraumatic, External right and left ear normal. Oropharynx is clear and moist EYES: Conjunctivae and EOM are normal. Pupils are equal, round, and reactive to light. No scleral icterus.  NECK: Normal range of motion, supple, no masses SKIN: Skin is warm and dry. No rash noted. Not diaphoretic. No erythema. No pallor. NEUROLOGIC: Alert and oriented to person, place, and time. Normal reflexes, muscle tone coordination. No cranial nerve deficit noted. PSYCHIATRIC: Normal mood and affect. Normal behavior. Normal judgment and thought content. CARDIOVASCULAR: Normal heart rate noted, regular rhythm RESPIRATORY: Effort and breath sounds normal, no problems with respiration noted ABDOMEN: Soft, nontender, nondistended. PELVIC: Deferred MUSCULOSKELETAL: Normal range of motion. No edema and no tenderness. 2+ distal pulses.    Labs:  Lab Results  Component Value Date   WBC 16.4 (H) 06/15/2020   HGB 12.6 06/15/2020   HCT 38.6 06/15/2020   MCV 91.0 06/15/2020   PLT 221 06/15/2020     Imaging Studies: No results found.  Assessment:    1. Pre-operative evaluation for tubal ligation   2. IUD (intrauterine device) in place     Plan:   Patient desires permanent sterilization.  Other reversible forms of contraception were discussed with patient; she declines all other modalities. Risks of procedure discussed with patient including but not limited to: risk of regret, permanence of method, bleeding, infection,  injury to surrounding organs and need for additional procedures.  Failure risk of about 1% with increased risk of ectopic gestation if pregnancy occurs was also discussed with patient.  Also discussed possibility of post-tubal pain syndrome. Patient verbalized understanding of these risks and wants to proceed with sterilization.  Written informed consent obtained. The patient concurred with the proposed plan, giving informed written consent for the surgery.   Preop testing ordered. Instructions reviewed, including NPO after midnight.      06/17/2020, MD [facility]

## 2020-12-03 NOTE — H&P (Signed)
   GYNECOLOGY PREOPERATIVE HISTORY AND PHYSICAL   Subjective:  Jillian Anderson is a 35 y.o. G3P1112 here for surgical management of undesired fertility.  Currently has Mirena IUD in place. No significant preoperative concerns.  Proposed surgery: Removal of IUD, Bilateral Tubal Ligation   Pertinent Gynecological History: Patient's last menstrual period was 10/30/2019 (approximate). Contraception: IUD Last pap: normal Date: 05/26/2017.    Past Medical History:  Diagnosis Date   Allergy    Asthma without status asthmaticus 03/15/2016   Astigmatism    COVID-19 04/2020   GERD (gastroesophageal reflux disease)    Gestational diabetes    History of kidney stones    In the past   IUGR (intrauterine growth restriction) affecting care of mother 11/03/2017   Migraines    Nephrolithiasis 09/03/2011   PCOS (polycystic ovarian syndrome)    Polycystic disease, ovaries    Polycystic ovarian syndrome 03/15/2016   Pre-eclampsia affecting pregnancy, antepartum 11/01/2017   Tachycardia    Tachycardia     Past Surgical History:  Procedure Laterality Date   DILATION AND EVACUATION N/A 04/23/2016   Procedure: DILATATION AND EVACUATION;  Surgeon: Martin A Defrancesco, MD;  Location: [facility];  Service: Gynecology;  Laterality: N/A;   WISDOM TOOTH EXTRACTION      OB History  Gravida Para Term Preterm AB Living  3 2 1 1 1 2  SAB IAB Ectopic Multiple Live Births  1     0 2    # Outcome Date GA Lbr Len/2nd Weight Sex Delivery Anes PTL Lv  3 Term 05/15/19 [redacted]w[redacted]d  6 lb 8.4 oz (2.96 kg) M Vag-Spont None N LIV     Complications: Hypertension, GDM (gestational diabetes mellitus)  2 Preterm 11/05/17 [redacted]w[redacted]d / 00:07 4 lb 10.8 oz (2.12 kg) M Vag-Spont Local Y LIV     Complications: Preeclampsia  1 SAB             Family History  Problem Relation Age of Onset   Hypertension Mother    Hyperlipidemia Father    Diabetes Father    Asthma Brother    Breast cancer Neg Hx    Ovarian cancer Neg Hx     Colon cancer Neg Hx     Social History   Socioeconomic History   Marital status: Married    Spouse name: Not on file   Number of children: Not on file   Years of education: Not on file   Highest education level: Not on file  Occupational History   Not on file  Tobacco Use   Smoking status: Never   Smokeless tobacco: Never  Vaping Use   Vaping Use: Never used  Substance and Sexual Activity   Alcohol use: Yes    Alcohol/week: 1.0 standard drink    Types: 1 Glasses of wine per week    Comment: occassionally    Drug use: No   Sexual activity: Yes    Birth control/protection: I.U.D.    Comment: inserted 06/26/2019  Other Topics Concern   Not on file  Social History Narrative   Not on file   Social Determinants of Health   Financial Resource Strain: Not on file  Food Insecurity: Not on file  Transportation Needs: Not on file  Physical Activity: Not on file  Stress: Not on file  Social Connections: Not on file  Intimate Partner Violence: Not on file    Current Outpatient Medications on File Prior to Visit  Medication Sig Dispense Refill   levonorgestrel (  MIRENA) 20 MCG/24HR IUD 1 each by Intrauterine route once. Inserted 06/26/2019     loratadine (CLARITIN) 10 MG tablet Take 10 mg by mouth as needed.      Prenatal Vit-Fe Fumarate-FA (PRENATAL MULTIVITAMIN) TABS tablet Take 1 tablet by mouth daily at 12 noon.     promethazine (PHENERGAN) 12.5 MG tablet Take 1 tablet (12.5 mg total) by mouth every 6 (six) hours as needed for nausea or vomiting. 15 tablet 0   No current facility-administered medications on file prior to visit.   Allergies  Allergen Reactions   Ceclor [Cefaclor]     hives   Codeine     Body fights itself eyes dilate   Red Dye     Hives, nausea and migraines    Blackberry Flavor Nausea And Vomiting and Rash   Raspberry Nausea And Vomiting and Rash     Review of Systems Constitutional: No recent fever/chills/sweats Respiratory: No recent  cough/bronchitis Cardiovascular: No chest pain Gastrointestinal: No recent nausea/vomiting/diarrhea Genitourinary: No UTI symptoms Hematologic/lymphatic:No history of coagulopathy or recent blood thinner use    Objective:   Blood pressure 105/74, pulse (!) 102, resp. rate 16, height 5\' 2"  (1.575 m), weight 181 lb 3.2 oz (82.2 kg), last menstrual period 10/30/2019, currently breastfeeding. CONSTITUTIONAL: Well-developed, well-nourished female in no acute distress.  HENT:  Normocephalic, atraumatic, External right and left ear normal. Oropharynx is clear and moist EYES: Conjunctivae and EOM are normal. Pupils are equal, round, and reactive to light. No scleral icterus.  NECK: Normal range of motion, supple, no masses SKIN: Skin is warm and dry. No rash noted. Not diaphoretic. No erythema. No pallor. NEUROLOGIC: Alert and oriented to person, place, and time. Normal reflexes, muscle tone coordination. No cranial nerve deficit noted. PSYCHIATRIC: Normal mood and affect. Normal behavior. Normal judgment and thought content. CARDIOVASCULAR: Normal heart rate noted, regular rhythm RESPIRATORY: Effort and breath sounds normal, no problems with respiration noted ABDOMEN: Soft, nontender, nondistended. PELVIC: Deferred MUSCULOSKELETAL: Normal range of motion. No edema and no tenderness. 2+ distal pulses.    Labs:  Lab Results  Component Value Date   WBC 16.4 (H) 06/15/2020   HGB 12.6 06/15/2020   HCT 38.6 06/15/2020   MCV 91.0 06/15/2020   PLT 221 06/15/2020     Imaging Studies: No results found.  Assessment:    1. Pre-operative evaluation for tubal ligation   2. IUD (intrauterine device) in place     Plan:   Patient desires permanent sterilization.  Other reversible forms of contraception were discussed with patient; she declines all other modalities. Risks of procedure discussed with patient including but not limited to: risk of regret, permanence of method, bleeding, infection,  injury to surrounding organs and need for additional procedures.  Failure risk of about 1% with increased risk of ectopic gestation if pregnancy occurs was also discussed with patient.  Also discussed possibility of post-tubal pain syndrome. Patient verbalized understanding of these risks and wants to proceed with sterilization.  Written informed consent obtained. The patient concurred with the proposed plan, giving informed written consent for the surgery.   Preop testing ordered. Instructions reviewed, including NPO after midnight.      06/17/2020, MD [facility]

## 2020-12-12 ENCOUNTER — Ambulatory Visit: Payer: Medicaid Other | Admitting: Family Medicine

## 2020-12-17 ENCOUNTER — Encounter
Admission: RE | Admit: 2020-12-17 | Discharge: 2020-12-17 | Disposition: A | Discharge status: Home / Self Care | Payer: Medicaid Other | Source: Ambulatory Visit | Attending: Obstetrics and Gynecology | Admitting: Obstetrics and Gynecology

## 2020-12-17 ENCOUNTER — Other Ambulatory Visit: Payer: Self-pay

## 2020-12-17 HISTORY — DX: Anemia, unspecified: D64.9

## 2020-12-17 NOTE — Pre-Procedure Instructions (Signed)
Copy and pasted from 06/11/2020 ED report:  ED ECG REPORT I, Chesley Noon, the attending physician, personally viewed and interpreted this ECG.    Date: 06/11/2020  EKG Time: 8:54  Rate: 86  Rhythm: normal sinus rhythm  Axis: Normal  Intervals:none  ST&T Change: None

## 2020-12-17 NOTE — Patient Instructions (Addendum)
Your procedure is scheduled on: Thursday, August 25 Report to the Registration Desk on the 1st floor of the [facility] To find out your arrival time, please call [phone] between 1PM - 3PM on: Wednesday, August 24  REMEMBER: Instructions that are not followed completely may result in serious medical risk, up to and including death; or upon the discretion of your surgeon and anesthesiologist your surgery may need to be rescheduled.  Do not eat food after midnight the night before surgery.  No gum chewing, lozengers or hard candies.  You may however, drink CLEAR liquids up to 2 hours before you are scheduled to arrive for your surgery. Do not drink anything within 2 hours of your scheduled arrival time.  Clear liquids include: - water  - apple juice without pulp - gatorade (not RED, PURPLE, OR BLUE) - black coffee or tea (Do NOT add milk or creamers to the coffee or tea) Do NOT drink anything that is not on this list.  DO NOT TAKE ANY MEDICATIONS THE MORNING OF SURGERY  One week prior to surgery: STARTING AUGUST 18 Stop Anti-inflammatories (NSAIDS) such as Advil, Aleve, Ibuprofen, Motrin, Naproxen, Naprosyn and Aspirin based products such as Excedrin, Goodys Powder, BC Powder. Stop ANY OVER THE COUNTER supplements until after surgery. STOP PROBIOTIC, MULTIPLE VITAMIN. You may however, continue to take Tylenol if needed for pain up until the day of surgery.  No Alcohol for 24 hours before or after surgery.  No Smoking including e-cigarettes for 24 hours prior to surgery.  No chewable tobacco products for at least 6 hours prior to surgery.  No nicotine patches on the day of surgery.  Do not use any "recreational" drugs for at least a week prior to your surgery.  Please be advised that the combination of cocaine and anesthesia may have negative outcomes, up to and including death. If you test positive for cocaine, your surgery will be cancelled.  On the morning of surgery  brush your teeth with toothpaste and water, you may rinse your mouth with mouthwash if you wish. Do not swallow any toothpaste or mouthwash.  Do not wear jewelry, make-up, hairpins, clips or nail polish.  Do not wear lotions, powders, or perfumes.   Do not shave body from the neck down 48 hours prior to surgery just in case you cut yourself which could leave a site for infection.  Also, freshly shaved skin may become irritated if using the CHG soap.  Contact lenses, hearing aids and dentures may not be worn into surgery.  Do not bring valuables to the hospital. [facility] is not responsible for any missing/lost belongings or valuables.   Use CHG Soap as directed on instruction sheet.  Notify your doctor if there is any change in your medical condition (cold, fever, infection).  Wear comfortable clothing (specific to your surgery type) to the hospital.  After surgery, you can help prevent lung complications by doing breathing exercises.  Take deep breaths and cough every 1-2 hours. Your doctor may order a device called an Incentive Spirometer to help you take deep breaths. When coughing or sneezing, hold a pillow firmly against your incision with both hands. This is called "splinting." Doing this helps protect your incision. It also decreases belly discomfort.  If you are being discharged the day of surgery, you will not be allowed to drive home. You will need a responsible adult (18 years or older) to drive you home and stay with you that night.   If  you are taking public transportation, you will need to have a responsible adult (18 years or older) with you. Please confirm with your physician that it is acceptable to use public transportation.   Please call the Pre-admissions Testing Dept. at [phone] if you have any questions about these instructions.  Surgery Visitation Policy:  Patients undergoing a surgery or procedure may have one family member or support person with  them as long as that person is not COVID-19 positive or experiencing its symptoms.  That person may remain in the waiting area during the procedure.

## 2020-12-19 ENCOUNTER — Other Ambulatory Visit: Payer: Self-pay

## 2020-12-19 ENCOUNTER — Encounter
Admission: RE | Admit: 2020-12-19 | Discharge: 2020-12-19 | Disposition: A | Discharge status: Home / Self Care | Payer: Medicaid Other | Source: Ambulatory Visit | Attending: Obstetrics and Gynecology | Admitting: Obstetrics and Gynecology

## 2020-12-19 ENCOUNTER — Encounter: Payer: Self-pay | Admitting: Urgent Care

## 2020-12-19 DIAGNOSIS — Z01812 Encounter for preprocedural laboratory examination: Secondary | ICD-10-CM | POA: Diagnosis not present

## 2020-12-19 LAB — BASIC METABOLIC PANEL
Anion gap: 8 (ref 5–15)
BUN: 10 mg/dL (ref 6–20)
CO2: 26 mmol/L (ref 22–32)
Calcium: 9 mg/dL (ref 8.9–10.3)
Chloride: 102 mmol/L (ref 98–111)
Creatinine, Ser: 0.84 mg/dL (ref 0.44–1.00)
GFR, Estimated: >60 mL/min (ref >60)
Glucose, Bld: 82 mg/dL (ref 70–99)
Potassium: 3.5 mmol/L (ref 3.5–5.1)
Sodium: 136 mmol/L (ref 135–145)

## 2020-12-19 LAB — CBC
HCT: 39.8 % (ref 36.0–46.0)
Hemoglobin: 13.3 g/dL (ref 12.0–15.0)
MCH: 30.9 pg (ref 26.0–34.0)
MCHC: 33.4 g/dL (ref 30.0–36.0)
MCV: 92.3 fL (ref 80.0–100.0)
Platelets: 204 10*3/uL (ref 150–400)
RBC: 4.31 MIL/uL (ref 3.87–5.11)
RDW: 12.4 % (ref 11.5–15.5)
WBC: 7.7 10*3/uL (ref 4.0–10.5)
nRBC: 0 % (ref 0.0–0.2)

## 2020-12-25 ENCOUNTER — Ambulatory Visit
Admission: RE | Admit: 2020-12-25 | Discharge: 2020-12-25 | Disposition: A | Discharge status: Home / Self Care | Payer: Medicaid Other | Attending: Obstetrics and Gynecology | Admitting: Obstetrics and Gynecology

## 2020-12-25 ENCOUNTER — Encounter: Payer: Self-pay | Admitting: Obstetrics and Gynecology

## 2020-12-25 ENCOUNTER — Ambulatory Visit: Payer: Medicaid Other | Admitting: Certified Registered Nurse Anesthetist

## 2020-12-25 ENCOUNTER — Encounter
Admission: RE | Disposition: A | Discharge status: Home / Self Care | Payer: Self-pay | Source: Home / Self Care | Attending: Obstetrics and Gynecology

## 2020-12-25 DIAGNOSIS — Z8616 Personal history of COVID-19: Secondary | ICD-10-CM | POA: Insufficient documentation

## 2020-12-25 DIAGNOSIS — Z833 Family history of diabetes mellitus: Secondary | ICD-10-CM | POA: Insufficient documentation

## 2020-12-25 DIAGNOSIS — Z975 Presence of (intrauterine) contraceptive device: Secondary | ICD-10-CM | POA: Diagnosis not present

## 2020-12-25 DIAGNOSIS — Z3009 Encounter for other general counseling and advice on contraception: Secondary | ICD-10-CM

## 2020-12-25 DIAGNOSIS — Z30432 Encounter for removal of intrauterine contraceptive device: Secondary | ICD-10-CM

## 2020-12-25 DIAGNOSIS — Z9851 Tubal ligation status: Secondary | ICD-10-CM

## 2020-12-25 DIAGNOSIS — Z825 Family history of asthma and other chronic lower respiratory diseases: Secondary | ICD-10-CM | POA: Insufficient documentation

## 2020-12-25 DIAGNOSIS — Z302 Encounter for sterilization: Secondary | ICD-10-CM | POA: Insufficient documentation

## 2020-12-25 DIAGNOSIS — Z8249 Family history of ischemic heart disease and other diseases of the circulatory system: Secondary | ICD-10-CM | POA: Insufficient documentation

## 2020-12-25 HISTORY — PX: IUD REMOVAL: SHX5392

## 2020-12-25 HISTORY — PX: LAPAROSCOPIC TUBAL LIGATION: SHX1937

## 2020-12-25 LAB — POCT PREGNANCY, URINE: Preg Test, Ur: NEGATIVE

## 2020-12-25 SURGERY — REMOVAL, INTRAUTERINE DEVICE
Anesthesia: General | Site: Vagina

## 2020-12-25 MED ORDER — IBUPROFEN 600 MG PO TABS: 600.0000 mg | ORAL_TABLET | Freq: Four times a day (QID) | ORAL | 1 refills | Status: DC | PRN

## 2020-12-25 MED ORDER — ACETAMINOPHEN 10 MG/ML IV SOLN: 1000.0000 mg | Freq: Once | INTRAVENOUS | Status: DC | PRN

## 2020-12-25 MED ORDER — OXYCODONE-ACETAMINOPHEN 5-325 MG PO TABS: 1.0000 | ORAL_TABLET | ORAL | 0 refills | Status: AC | PRN

## 2020-12-25 MED ORDER — OXYCODONE HCL 5 MG PO TABS
ORAL_TABLET | ORAL | Status: AC
  Filled 2020-12-25: qty 1

## 2020-12-25 MED ORDER — BUPIVACAINE HCL 0.5 % IJ SOLN
INTRAMUSCULAR | Status: DC | PRN
  Administered 2020-12-25: 10 mL

## 2020-12-25 MED ORDER — MIDAZOLAM HCL 2 MG/2ML IJ SOLN
INTRAMUSCULAR | Status: AC
  Filled 2020-12-25: qty 2

## 2020-12-25 MED ORDER — SUGAMMADEX SODIUM 200 MG/2ML IV SOLN
INTRAVENOUS | Status: DC | PRN
  Administered 2020-12-25: 200 mg via INTRAVENOUS

## 2020-12-25 MED ORDER — KETOROLAC TROMETHAMINE 30 MG/ML IJ SOLN
INTRAMUSCULAR | Status: DC | PRN
  Administered 2020-12-25: 30 mg via INTRAVENOUS

## 2020-12-25 MED ORDER — ONDANSETRON HCL 4 MG/2ML IJ SOLN: 4.0000 mg | Freq: Once | INTRAMUSCULAR | Status: DC | PRN

## 2020-12-25 MED ORDER — DEXAMETHASONE SODIUM PHOSPHATE 10 MG/ML IJ SOLN
INTRAMUSCULAR | Status: DC | PRN
Start: 2020-12-25 — End: 2020-12-25
  Administered 2020-12-25: 10 mg via INTRAVENOUS

## 2020-12-25 MED ORDER — ONDANSETRON HCL 4 MG/2ML IJ SOLN
INTRAMUSCULAR | Status: AC
  Filled 2020-12-25: qty 2

## 2020-12-25 MED ORDER — FAMOTIDINE 20 MG PO TABS: 20.0000 mg | ORAL_TABLET | Freq: Once | ORAL | Status: AC

## 2020-12-25 MED ORDER — PROPOFOL 10 MG/ML IV BOLUS
INTRAVENOUS | Status: AC
  Filled 2020-12-25: qty 20

## 2020-12-25 MED ORDER — FENTANYL CITRATE (PF) 100 MCG/2ML IJ SOLN
INTRAMUSCULAR | Status: DC | PRN
  Administered 2020-12-25: 100 ug via INTRAVENOUS

## 2020-12-25 MED ORDER — FAMOTIDINE 20 MG PO TABS
ORAL_TABLET | ORAL | Status: AC
  Administered 2020-12-25: 20 mg via ORAL
  Filled 2020-12-25: qty 1

## 2020-12-25 MED ORDER — MIDAZOLAM HCL 2 MG/2ML IJ SOLN
INTRAMUSCULAR | Status: DC | PRN
  Administered 2020-12-25: 2 mg via INTRAVENOUS

## 2020-12-25 MED ORDER — ACETAMINOPHEN 10 MG/ML IV SOLN
INTRAVENOUS | Status: DC | PRN
  Administered 2020-12-25: 1000 mg via INTRAVENOUS

## 2020-12-25 MED ORDER — LACTATED RINGERS IV SOLN: INTRAVENOUS | Status: DC

## 2020-12-25 MED ORDER — ONDANSETRON HCL 4 MG/2ML IJ SOLN
INTRAMUSCULAR | Status: DC | PRN
  Administered 2020-12-25: 4 mg via INTRAVENOUS

## 2020-12-25 MED ORDER — BUPIVACAINE HCL (PF) 0.5 % IJ SOLN
INTRAMUSCULAR | Status: AC
  Filled 2020-12-25: qty 30

## 2020-12-25 MED ORDER — OXYCODONE HCL 5 MG/5ML PO SOLN: 5.0000 mg | Freq: Once | ORAL | Status: AC | PRN

## 2020-12-25 MED ORDER — ROCURONIUM BROMIDE 100 MG/10ML IV SOLN
INTRAVENOUS | Status: DC | PRN
  Administered 2020-12-25: 40 mg via INTRAVENOUS

## 2020-12-25 MED ORDER — FENTANYL CITRATE (PF) 100 MCG/2ML IJ SOLN
INTRAMUSCULAR | Status: AC
  Administered 2020-12-25: 25 ug via INTRAVENOUS
  Filled 2020-12-25: qty 2

## 2020-12-25 MED ORDER — PHENYLEPHRINE HCL (PRESSORS) 10 MG/ML IV SOLN
INTRAVENOUS | Status: DC | PRN
  Administered 2020-12-25: 100 ug via INTRAVENOUS
  Administered 2020-12-25: 200 ug via INTRAVENOUS

## 2020-12-25 MED ORDER — 0.9 % SODIUM CHLORIDE (POUR BTL) OPTIME
TOPICAL | Status: DC | PRN
  Administered 2020-12-25: 500 mL

## 2020-12-25 MED ORDER — PROPOFOL 10 MG/ML IV BOLUS
INTRAVENOUS | Status: DC | PRN
  Administered 2020-12-25: 130 mg via INTRAVENOUS

## 2020-12-25 MED ORDER — FENTANYL CITRATE (PF) 100 MCG/2ML IJ SOLN
25.0000 ug | INTRAMUSCULAR | Status: DC | PRN
  Administered 2020-12-25: 25 ug via INTRAVENOUS

## 2020-12-25 MED ORDER — DEXAMETHASONE SODIUM PHOSPHATE 10 MG/ML IJ SOLN
INTRAMUSCULAR | Status: AC
  Filled 2020-12-25: qty 1

## 2020-12-25 MED ORDER — CHLORHEXIDINE GLUCONATE 0.12 % MT SOLN
OROMUCOSAL | Status: AC
  Filled 2020-12-25: qty 15

## 2020-12-25 MED ORDER — KETOROLAC TROMETHAMINE 30 MG/ML IJ SOLN
INTRAMUSCULAR | Status: AC
  Filled 2020-12-25: qty 1

## 2020-12-25 MED ORDER — FENTANYL CITRATE (PF) 100 MCG/2ML IJ SOLN
INTRAMUSCULAR | Status: AC
  Filled 2020-12-25: qty 2

## 2020-12-25 MED ORDER — OXYCODONE HCL 5 MG PO TABS
5.0000 mg | ORAL_TABLET | Freq: Once | ORAL | Status: AC | PRN
  Administered 2020-12-25: 5 mg via ORAL

## 2020-12-25 MED ORDER — POVIDONE-IODINE 10 % EX SWAB: 2.0000 "application " | Freq: Once | CUTANEOUS | Status: DC

## 2020-12-25 MED ORDER — ACETAMINOPHEN 10 MG/ML IV SOLN
INTRAVENOUS | Status: AC
  Filled 2020-12-25: qty 100

## 2020-12-25 MED ORDER — LIDOCAINE HCL (PF) 2 % IJ SOLN
INTRAMUSCULAR | Status: AC
  Filled 2020-12-25: qty 5

## 2020-12-25 MED ORDER — LIDOCAINE HCL (CARDIAC) PF 100 MG/5ML IV SOSY
PREFILLED_SYRINGE | INTRAVENOUS | Status: DC | PRN
  Administered 2020-12-25: 100 mg via INTRAVENOUS

## 2020-12-25 SURGICAL SUPPLY — 40 items
ADH SKN CLS APL DERMABOND .7 (GAUZE/BANDAGES/DRESSINGS) ×2
APL PRP STRL LF DISP 70% ISPRP (MISCELLANEOUS) ×2
BLADE SURG SZ11 CARB STEEL (BLADE) ×3 IMPLANT
CATH ROBINSON RED A/P 16FR (CATHETERS) ×3 IMPLANT
CHLORAPREP W/TINT 26 (MISCELLANEOUS) ×3 IMPLANT
CUP MEDICINE 2OZ PLAST GRAD ST (MISCELLANEOUS) IMPLANT
DERMABOND ADVANCED (GAUZE/BANDAGES/DRESSINGS) ×1
DERMABOND ADVANCED .7 DNX12 (GAUZE/BANDAGES/DRESSINGS) ×2 IMPLANT
DRAPE UNDER BUTTOCK W/FLU (DRAPES) ×3 IMPLANT
DRSG TELFA 3X8 NADH (GAUZE/BANDAGES/DRESSINGS) IMPLANT
GAUZE 4X4 16PLY ~~LOC~~+RFID DBL (SPONGE) ×6 IMPLANT
GLOVE SURG ENC MOIS LTX SZ6.5 (GLOVE) ×3 IMPLANT
GLOVE SURG UNDER LTX SZ7 (GLOVE) ×3 IMPLANT
GOWN STRL REUS W/ TWL LRG LVL3 (GOWN DISPOSABLE) ×4 IMPLANT
GOWN STRL REUS W/TWL LRG LVL3 (GOWN DISPOSABLE) ×6
KIT PINK PAD W/HEAD ARE REST (MISCELLANEOUS) ×3
KIT PINK PAD W/HEAD ARM REST (MISCELLANEOUS) ×2 IMPLANT
KIT TURNOVER CYSTO (KITS) ×3 IMPLANT
LABEL OR SOLS (LABEL) IMPLANT
MANIFOLD NEPTUNE II (INSTRUMENTS) IMPLANT
NEEDLE INSUFFLATION 14GA 120MM (NEEDLE) ×3 IMPLANT
NS IRRIG 500ML POUR BTL (IV SOLUTION) ×3 IMPLANT
PACK DNC HYST (MISCELLANEOUS) IMPLANT
PACK GYN LAPAROSCOPIC (MISCELLANEOUS) ×3 IMPLANT
PAD OB MATERNITY 4.3X12.25 (PERSONAL CARE ITEMS) ×3 IMPLANT
PAD PREP 24X41 OB/GYN DISP (PERSONAL CARE ITEMS) ×3 IMPLANT
SCRUB EXIDINE 4% CHG 4OZ (MISCELLANEOUS) ×3 IMPLANT
SET TUBE SMOKE EVAC HIGH FLOW (TUBING) ×3 IMPLANT
SHEARS ENDO 5MM LNG  ASK BEFOR (MISCELLANEOUS) ×1
SHEARS ENDO 5MM LNG ASK BEFOR (MISCELLANEOUS) ×2 IMPLANT
SOL ANTI-FOG 6CC FOG-OUT (MISCELLANEOUS) ×2 IMPLANT
SOL FOG-OUT ANTI-FOG 6CC (MISCELLANEOUS) ×1
SOL PREP PVP 2OZ (MISCELLANEOUS)
SOLUTION PREP PVP 2OZ (MISCELLANEOUS) IMPLANT
SUT VIC AB 4-0 SH 27 (SUTURE) ×3
SUT VIC AB 4-0 SH 27XANBCTRL (SUTURE) ×2 IMPLANT
SUT VICRYL 0 AB UR-6 (SUTURE) ×3 IMPLANT
TOWEL OR 17X26 4PK STRL BLUE (TOWEL DISPOSABLE) ×3 IMPLANT
TROCAR XCEL 12X100 BLDLESS (ENDOMECHANICALS) ×3 IMPLANT
WATER STERILE IRR 500ML POUR (IV SOLUTION) IMPLANT

## 2020-12-25 NOTE — Anesthesia Procedure Notes (Signed)
Procedure Name: Intubation Date/Time: 12/25/2020 12:40 PM Performed by: Ginger Carne, CRNA Pre-anesthesia Checklist: Patient identified, Emergency Drugs available, Suction available, Patient being monitored and Timeout performed Patient Re-evaluated:Patient Re-evaluated prior to induction Oxygen Delivery Method: Circle system utilized Preoxygenation: Pre-oxygenation with 100% oxygen Induction Type: IV induction Ventilation: Mask ventilation without difficulty Laryngoscope Size: McGraph and 3 Grade View: Grade I Tube type: Oral Tube size: 7.0 mm Number of attempts: 1 Airway Equipment and Method: Stylet and Video-laryngoscopy Placement Confirmation: ETT inserted through vocal cords under direct vision, positive ETCO2 and breath sounds checked- equal and bilateral Secured at: 20 cm Tube secured with: Tape Dental Injury: Teeth and Oropharynx as per pre-operative assessment

## 2020-12-25 NOTE — Anesthesia Preprocedure Evaluation (Signed)
Anesthesia Evaluation  Patient identified by MRN, date of birth, ID band Patient awake    Reviewed: Allergy & Precautions, NPO status , Patient's Chart, lab work & pertinent test results  History of Anesthesia Complications Negative for: history of anesthetic complications  Airway Mallampati: I  TM Distance: >3 FB Neck ROM: Full    Dental no notable dental hx. (+) Teeth Intact   Pulmonary asthma , neg sleep apnea, neg COPD, Patient abstained from smoking.Not current smoker,    Pulmonary exam normal breath sounds clear to auscultation       Cardiovascular Exercise Tolerance: Good METS(-) hypertension(-) CAD and (-) Past MI (-) dysrhythmias  Rhythm:Regular Rate:Normal - Systolic murmurs    Neuro/Psych  Headaches, negative psych ROS   GI/Hepatic neg GERD  ,(+)     (-) substance abuse  ,   Endo/Other  neg diabetes  Renal/GU negative Renal ROS     Musculoskeletal   Abdominal   Peds  Hematology   Anesthesia Other Findings Past Medical History: No date: Allergy No date: Anemia 03/15/2016: Asthma without status asthmaticus No date: Astigmatism 04/2020: COVID-19 No date: GERD (gastroesophageal reflux disease) No date: Gestational diabetes No date: History of kidney stones     Comment:  In the past 11/03/2017: IUGR (intrauterine growth restriction) affecting care of  mother No date: Migraines No date: Polycystic disease, ovaries 03/15/2016: Polycystic ovarian syndrome 11/01/2017: Pre-eclampsia affecting pregnancy, antepartum No date: Tachycardia     Comment:  during pregnancy  Reproductive/Obstetrics                             Anesthesia Physical Anesthesia Plan  ASA: 2  Anesthesia Plan: General   Post-op Pain Management:    Induction: Intravenous  PONV Risk Score and Plan: 4 or greater and Ondansetron, Dexamethasone and Midazolam  Airway Management Planned: Oral  ETT  Additional Equipment: None  Intra-op Plan:   Post-operative Plan: Extubation in OR  Informed Consent: I have reviewed the patients History and Physical, chart, labs and discussed the procedure including the risks, benefits and alternatives for the proposed anesthesia with the patient or authorized representative who has indicated his/her understanding and acceptance.     Dental advisory given  Plan Discussed with: CRNA and Surgeon  Anesthesia Plan Comments: (Discussed risks of anesthesia with patient, including PONV, sore throat, lip/dental damage. Rare risks discussed as well, such as cardiorespiratory and neurological sequelae, and allergic reactions. Patient understands.)        Anesthesia Quick Evaluation

## 2020-12-25 NOTE — Op Note (Signed)
Procedure(s): INTRAUTERINE DEVICE (IUD) REMOVAL LAPAROSCOPIC TUBAL LIGATION Procedure Note  Jillian Anderson female 35 y.o. 12/25/2020  Indications: The patient is a 35 y.o. [phone] female with Mirena IUD in place, undesired fertility.   Pre-operative Diagnosis: Mirena IUD in place, desires permanent sterilization  Post-operative Diagnosis: Same  Surgeon: Hildred Laser, MD  Assistants:  Surgical scrub assist. An experienced assistant was required given the standard of surgical care given the complexity of the case.  This assistant was needed for exposure, dissection, suctioning, retraction, instrument exchange, and for overall help during the procedure.  Anesthesia: General endotracheal anesthesia  Findings: Normal uterus, tubes, and ovaries. IUD threads visualized from cervical os, ~ 2 cm in length.   Procedure Details: The patient was seen in the Holding Room. The risks, benefits, complications, treatment options, and expected outcomes were discussed with the patient.  The patient concurred with the proposed plan, giving informed consent.  The site of surgery properly noted/marked. The patient was taken to the Operating Room, identified as Jillian Anderson and the procedure verified as Procedure(s) (LRB): INTRAUTERINE DEVICE (IUD) REMOVAL (N/A), LAPAROSCOPIC TUBAL LIGATION (Bilateral). A Time Out was held and the above information confirmed.  She was then placed under general anesthesia without difficulty. She was placed in the dorsal lithotomy position, and was prepped and draped in a sterile manner.  A straight catheterization was performed. A sterile speculum was inserted into the vagina and the cervix was grasped at the anterior lip using a single-toothed tenaculum.  The IUD threads were identified, grasped with a ring forcep, and removed from the uterine cavity without difficulty.  A Hulka clamp was then placed for uterine manipulation.  The speculum and tenaculum were then  removed.   Attention was turned to the abdomen where an umbilical incision was made with the scalpel.  The Veress needle was the inserted into the abdomen, and it was allowed to insufflated with carbon dioxide gas until an adequate pneumoperitoneum was obtained.  Positive saline test was performed. The Veress needle was then removed, and an Optiview 12-mm trocar and sleeve were then advanced without difficulty into the abdomen.  A survey of the patient's pelvis and abdomen revealed entirely normal anatomy.   The fallopian tubes were observed and found to be normal in appearance. Bipolar forceps were then advanced through the operative port and used to coagulate a 3 cm portion of the left tube in the mid isthmic area.  Good blanching and coagulation was noted at the site of the application.  There was no bleeding noted in the mesosalpinx.  A similar process was carried out on the right fallopian tube allowing for bilateral tubal sterilization.  The coagulated areas were then incised with laparoscopic cold endoshears bilaterally. Good hemostasis was noted overall.  The instruments were then removed from the patient's abdomen and the fascial incision was repaired with 0 Vicryl, and the skin was closed with Dermabond.  The uterine manipulator and was removed from the vagina without complications. The patient tolerated the procedure well.  Sponge, lap, and needle counts were correct times two.  The patient was then taken to the recovery room awake, extubated and in stable condition.  Estimated Blood Loss:  minimal      Drains: straight catheterization prior to procedure with  300 ml of clear urine         Total IV Fluids:  900 ml  Specimens: None         Implants: None  Complications:  None; patient tolerated the procedure well.         Disposition: PACU - hemodynamically stable.         Condition: stable   Jillian Maid, MD [facility]

## 2020-12-25 NOTE — Transfer of Care (Signed)
Immediate Anesthesia Transfer of Care Note  Patient: Jillian Anderson  Procedure(s) Performed: INTRAUTERINE DEVICE (IUD) REMOVAL (Vagina ) LAPAROSCOPIC TUBAL LIGATION (Bilateral: Abdomen)  Patient Location: PACU  Anesthesia Type:General  Level of Consciousness: sedated  Airway & Oxygen Therapy: Patient Spontanous Breathing and Patient connected to face mask oxygen  Post-op Assessment: Report given to RN and Post -op Vital signs reviewed and stable  Post vital signs: Reviewed and stable  Last Vitals:  Vitals Value Taken Time  BP 96/75 12/25/20 1400  Temp 36.1 C 12/25/20 1359  Pulse 75 12/25/20 1403  Resp 13 12/25/20 1403  SpO2 100 % 12/25/20 1403  Vitals shown include unvalidated device data.  Last Pain:  Vitals:   12/25/20 1359  TempSrc:   PainSc: Asleep         Complications: No notable events documented.

## 2020-12-25 NOTE — Interval H&P Note (Signed)
History and Physical Interval Note:  12/25/2020 12:26 PM  Jillian Anderson  has presented today for surgery, with the diagnosis of Undesired Fertility.  The various methods of treatment have been discussed with the patient and family. After consideration of risks, benefits and other options for treatment, the patient has consented to  Procedure(s): INTRAUTERINE DEVICE (IUD) REMOVAL (N/A),  LAPAROSCOPIC TUBAL LIGATION (Bilateral) as a surgical intervention.  The patient's history has been reviewed, patient examined, no change in status, stable for surgery.  I have reviewed the patient's chart and labs.  Questions were answered to the patient's satisfaction.     Hildred Laser, MD [facility]

## 2020-12-25 NOTE — Discharge Instructions (Signed)

## 2020-12-25 NOTE — Anesthesia Postprocedure Evaluation (Signed)
Anesthesia Post Note  Patient: Jillian Anderson  Procedure(s) Performed: INTRAUTERINE DEVICE (IUD) REMOVAL (Vagina ) LAPAROSCOPIC TUBAL LIGATION (Bilateral: Abdomen)  Patient location during evaluation: PACU Anesthesia Type: General Level of consciousness: awake and alert Pain management: pain level controlled Vital Signs Assessment: post-procedure vital signs reviewed and stable Respiratory status: spontaneous breathing, nonlabored ventilation, respiratory function stable and patient connected to nasal cannula oxygen Cardiovascular status: blood pressure returned to baseline and stable Postop Assessment: no apparent nausea or vomiting Anesthetic complications: no   No notable events documented.   Last Vitals:  Vitals:   12/25/20 1359 12/25/20 1400  BP: 111/65 96/75  Pulse: 89 89  Resp: 15 15  Temp: (!) 36.1 C   SpO2: 100% 100%    Last Pain:  Vitals:   12/25/20 1359  TempSrc:   PainSc: Asleep                 Corinda Gubler

## 2020-12-26 ENCOUNTER — Encounter: Payer: Self-pay | Admitting: Obstetrics and Gynecology

## 2021-01-13 ENCOUNTER — Other Ambulatory Visit: Payer: Self-pay

## 2021-01-13 ENCOUNTER — Encounter: Payer: Self-pay | Admitting: Obstetrics and Gynecology

## 2021-01-13 ENCOUNTER — Ambulatory Visit (INDEPENDENT_AMBULATORY_CARE_PROVIDER_SITE_OTHER): Payer: Medicaid Other | Admitting: Obstetrics and Gynecology

## 2021-01-13 VITALS — BP 115/78 | HR 89 | Resp 16 | Ht 62.0 in | Wt 181.8 lb

## 2021-01-13 DIAGNOSIS — Z9851 Tubal ligation status: Secondary | ICD-10-CM

## 2021-01-13 DIAGNOSIS — Z09 Encounter for follow-up examination after completed treatment for conditions other than malignant neoplasm: Secondary | ICD-10-CM

## 2021-01-13 NOTE — Progress Notes (Signed)
    OBSTETRICS/GYNECOLOGY POST-OPERATIVE CLINIC VISIT  Subjective:     Jillian Anderson is a 35 y.o. female who presents to the clinic 2 weeks status post IUD removal and  tubal ligation  for requested sterilization. Eating a regular diet with difficulty. Bowel movements are normal. The patient is not having any pain. She has a spot on her vagina that she has some concerns about. Spot is about the size of a tick. Looks like a blister. No pain.  The following portions of the patient's history were reviewed and updated as appropriate: allergies, current medications, past family history, past medical history, past social history, past surgical history, and problem list.  Review of Systems Pertinent items noted in HPI and remainder of comprehensive ROS otherwise negative.   Objective:   BP 115/78   Pulse 89   Resp 16   Ht 5\' 2"  (1.575 m)   Wt 181 lb 12.8 oz (82.5 kg)   BMI 33.25 kg/m  Body mass index is 33.25 kg/m.  General:  alert and no distress  Abdomen: soft, bowel sounds active, non-tender  Incision:   healing well, no drainage, no erythema, no hernia, no seroma, no swelling, no dehiscence, incision well approximated   Pelvis:  External genitalia with pinpoint area of hypervascularity on left vulva. Vagina with small amount of yellow mucoid discharge, no odor. Bimanual exam not performed.     Pathology:  None  Assessment:   Patient s/p bilateral tubal ligation, IUD removal.   Doing well postoperatively.   Plan:   1. Continue any current medications as needed. 2. Wound care discussed. Also discussed area of hypervascularity was normal.  3. Operative findings reviewed.  4. Activity restrictions: none 5. Anticipated return to work:  She returned to work on 12/29/2020 . 6. Follow up: 4  months  for annual exam.     12/31/2020, MD [facility]

## 2021-02-11 ENCOUNTER — Ambulatory Visit: Payer: Self-pay | Admitting: Urology

## 2021-03-22 IMAGING — US US PELVIS COMPLETE WITH TRANSVAGINAL
1 series · 13 of 25 positions shown · non-contrast
Comparison: CT scan 06/27/2020

CLINICAL DATA: Right ovarian cyst.



[Series 1: us pelvic complete with transvaginal · 13 of 94 slices shown]
[im 1/94]
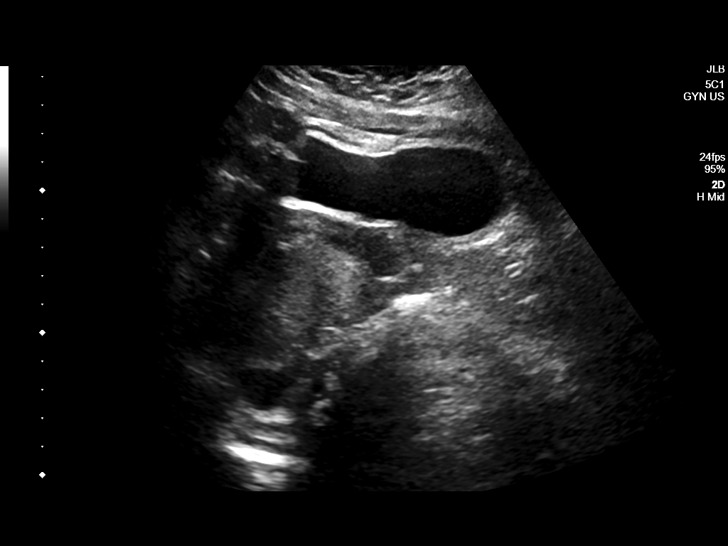
[im 8/94]
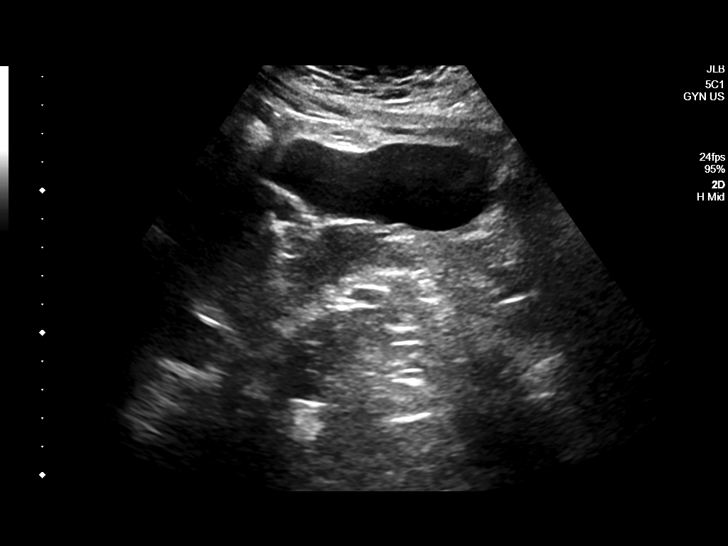
[im 16/94]
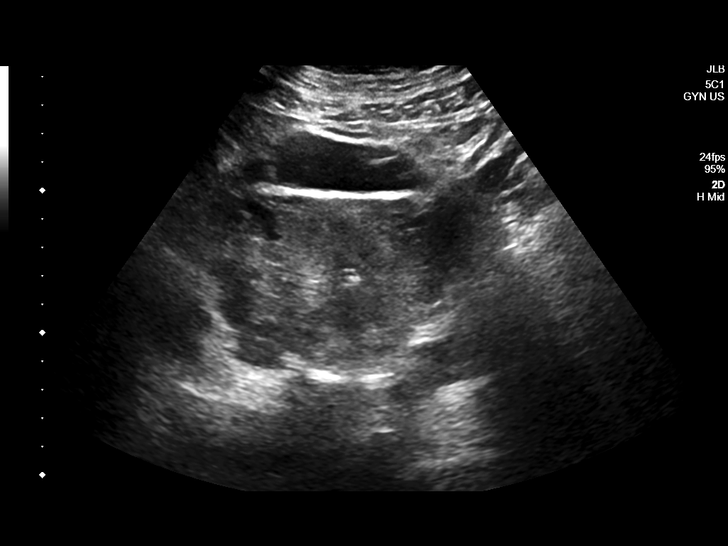
[im 24/94]
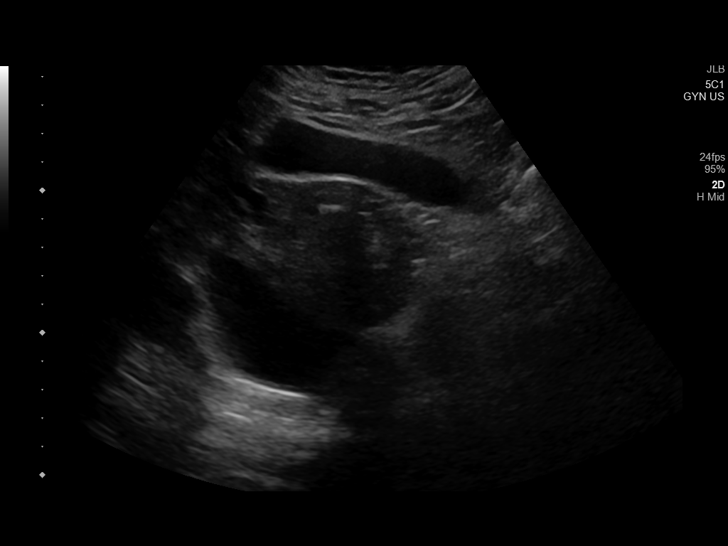
[im 32/94]
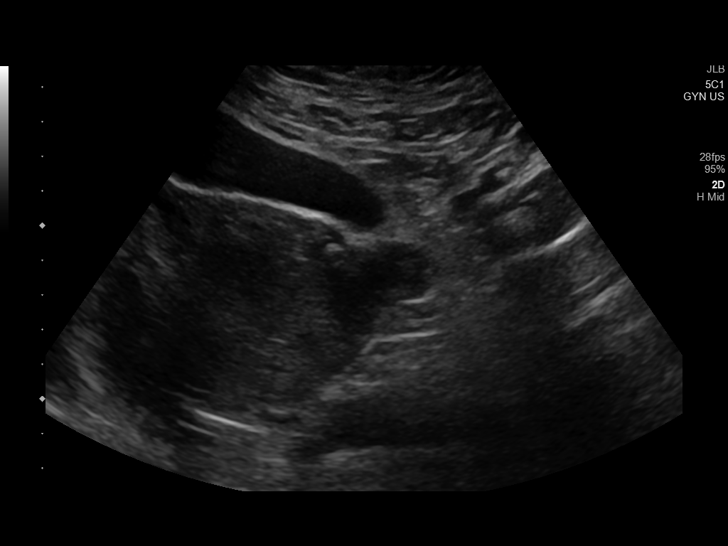
[im 39/94]
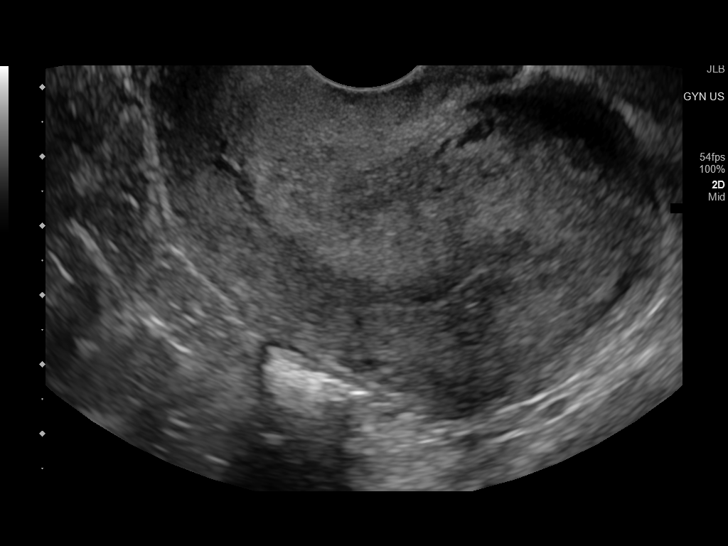
[im 47/94]
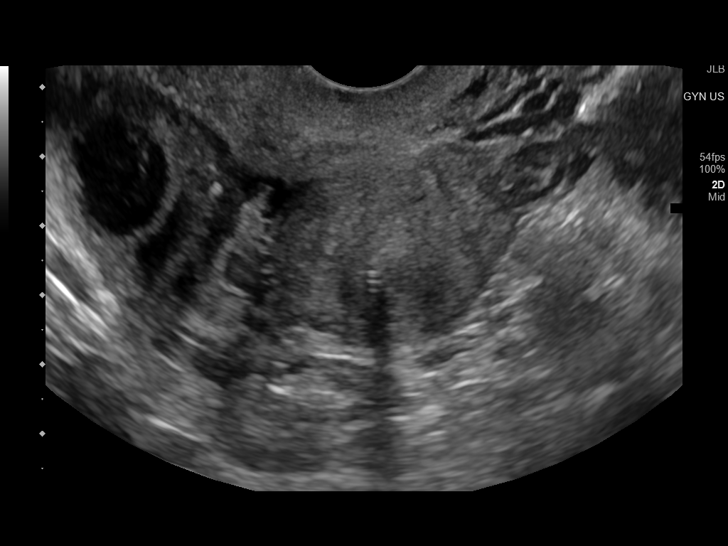
[im 55/94]
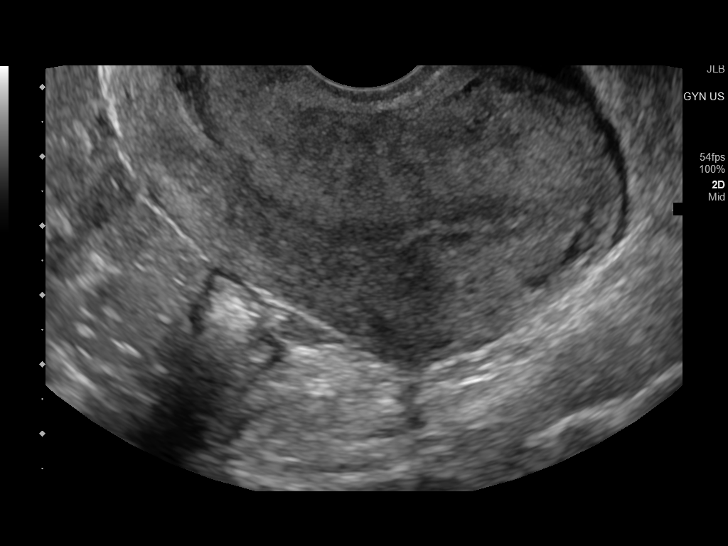
[im 63/94]
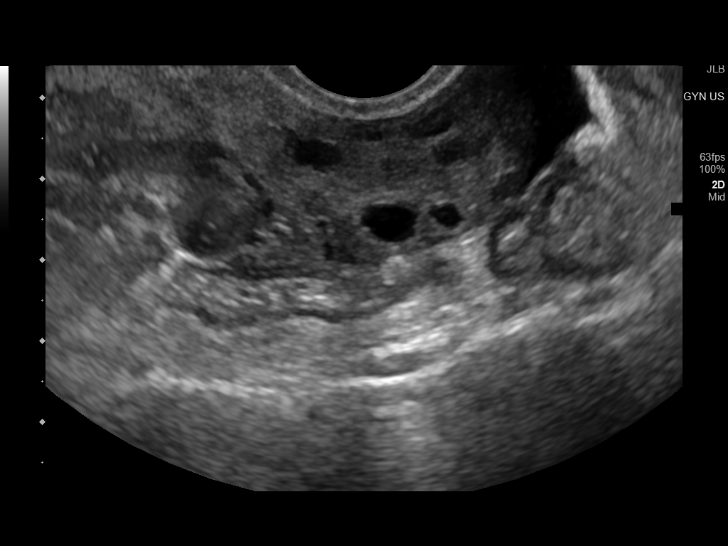
[im 70/94]
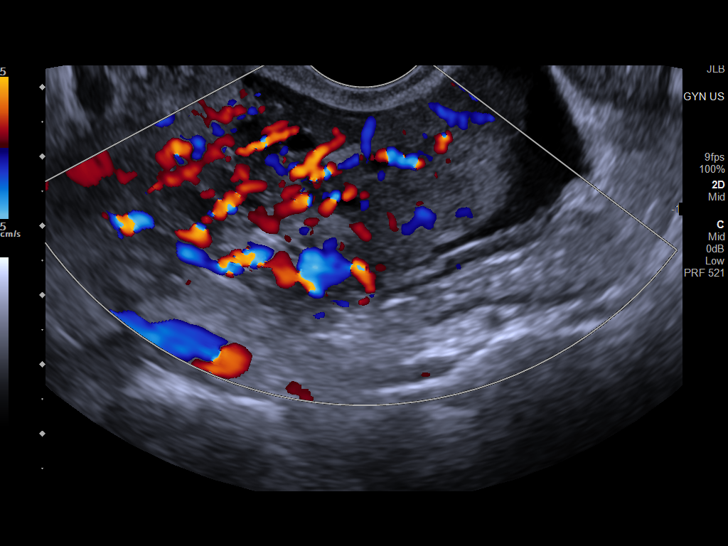
[im 78/94]
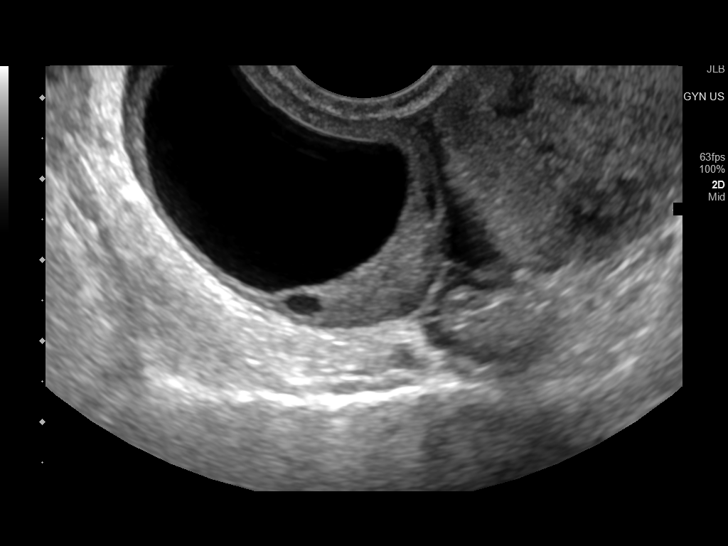
[im 86/94]
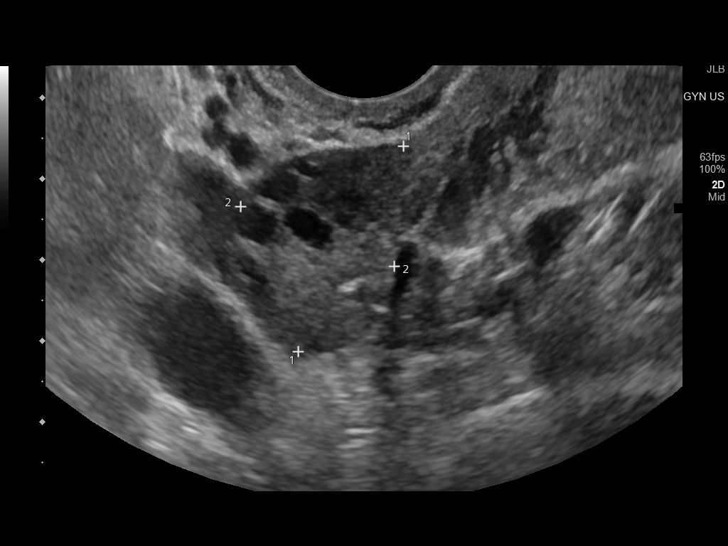
[im 94/94]
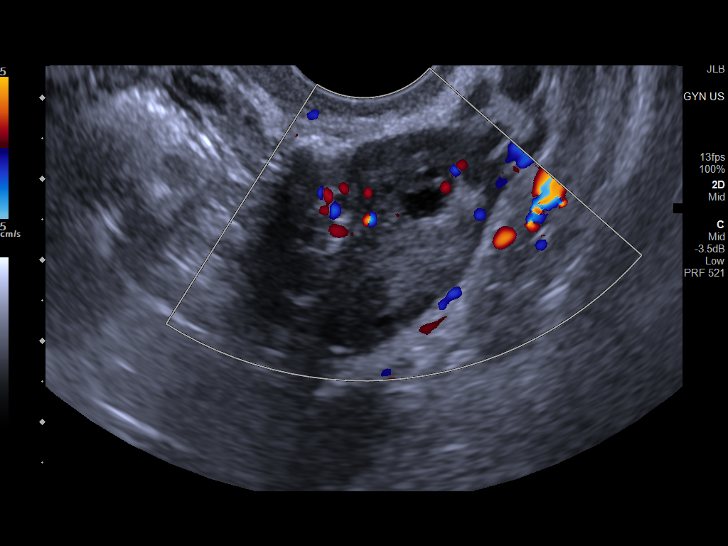

[13 of 25 positions shown; findings below may reference images not displayed]

FINDINGS: Uterus

Measurements: 7.5 x 4.3 x 5.3 cm = volume: 88 mL. IUD visualized in
the endometrial canal although localization is not well determined
on this exam. Body of the IUD appears to be in the lower uterine
segment however the apparent rotation seen on the previous CT is not
readily evident by ultrasound today.

Endometrium

Thickness: 2-3 mm.  No focal abnormality visualized.

Right ovary

Measurements: 5.4 x 3.3 x 2.4 cm = volume: 22 mL. 4.8 x 2.4 x 1.9 cm
cystic lesion evident, measuring smaller than on previous CT. There
is a second internally loculated cystic component associated in this
lesion containing internal echogenic debris.

Left ovary

Measurements: 2.9 x 2.0 x 1.9 cm = volume: 6 mL. Normal
appearance/no adnexal mass.

Other findings

Trace free fluid noted.
IMPRESSION: 1. Right adnexal cyst is decreased in size in the interval,
measuring 4.8 x 2.4 cm today compared to 6.2 x 5.1 cm previously.
Given the somewhat unusual loculated component with internal
echogenic debris, continued follow-up recommended. Repeat ultrasound
exam in 6 weeks could be used to further evaluate. Given that there
is some septal thickening associated with the internally loculated
component, immediate pelvic MRI with and without contrast could also
be used to further characterize.
2. Trace intraperitoneal free fluid

## 2021-04-17 IMAGING — US US PELVIS COMPLETE WITH TRANSVAGINAL
1 series · 13 of 25 positions shown · non-contrast
Comparison: Prior ultrasound from 07/11/2020.

CLINICAL DATA: Follow-up examination of right ovarian cyst.



[Series 1: us pelvis complete with transvaginal · 0.21mm/px · 13 of 126 slices shown]
[im 1/126]
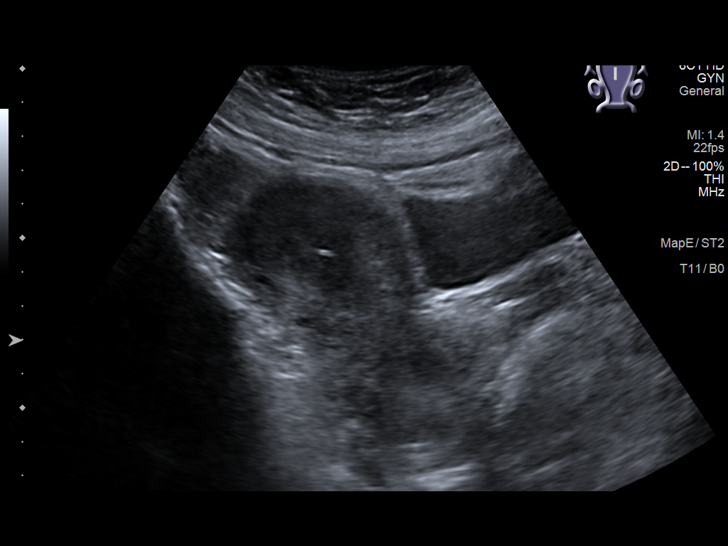
[im 11/126]
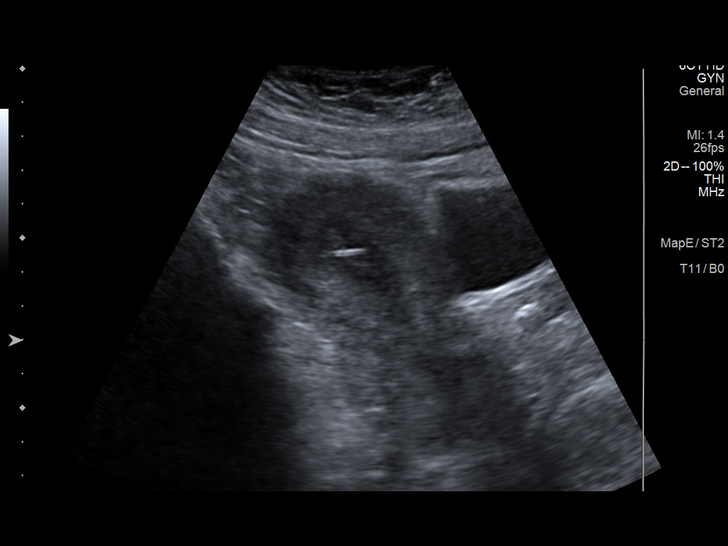
[im 21/126]
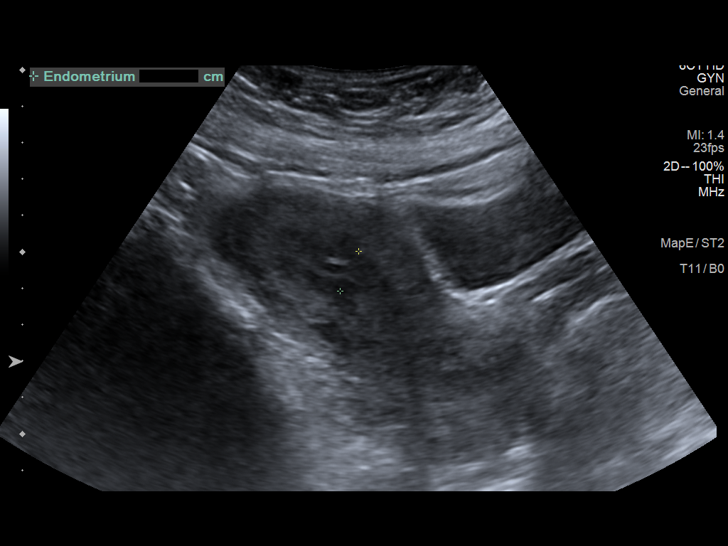
[im 32/126]
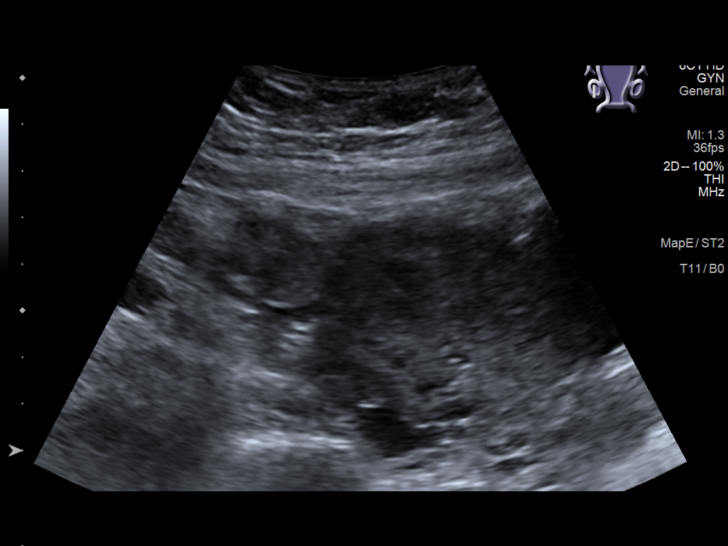
[im 42/126]
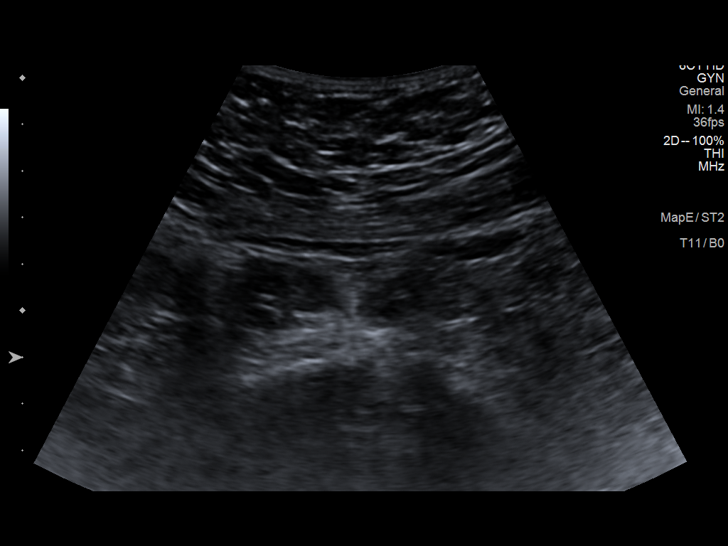
[im 53/126]
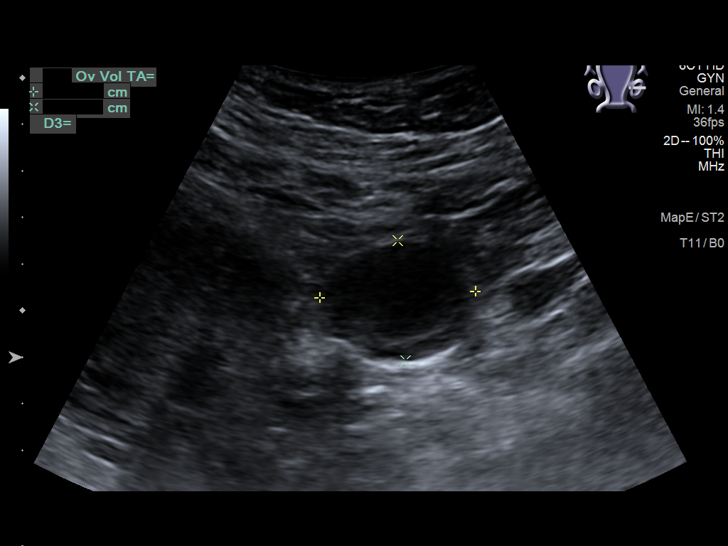
[im 63/126]
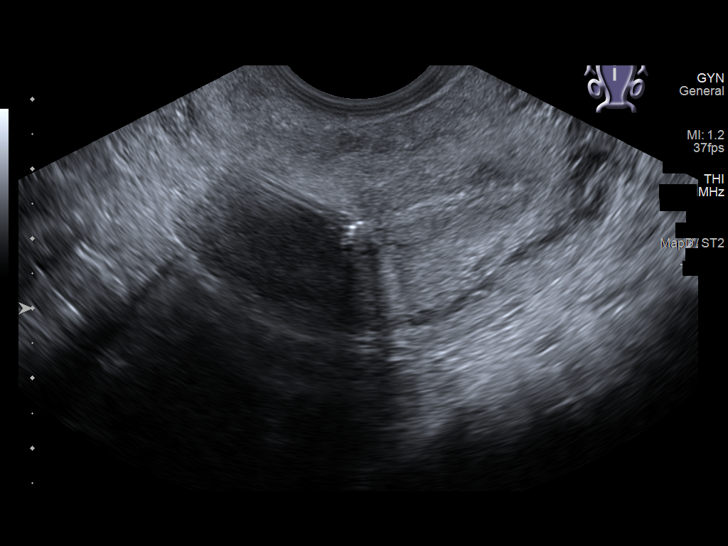
[im 73/126]
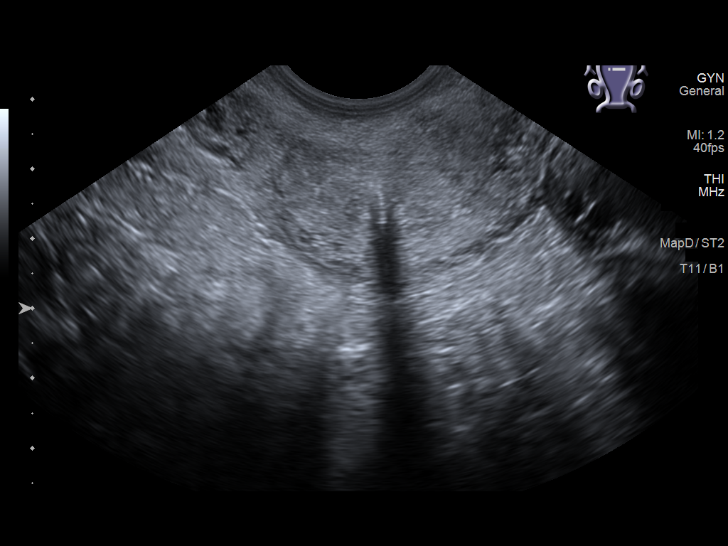
[im 84/126]
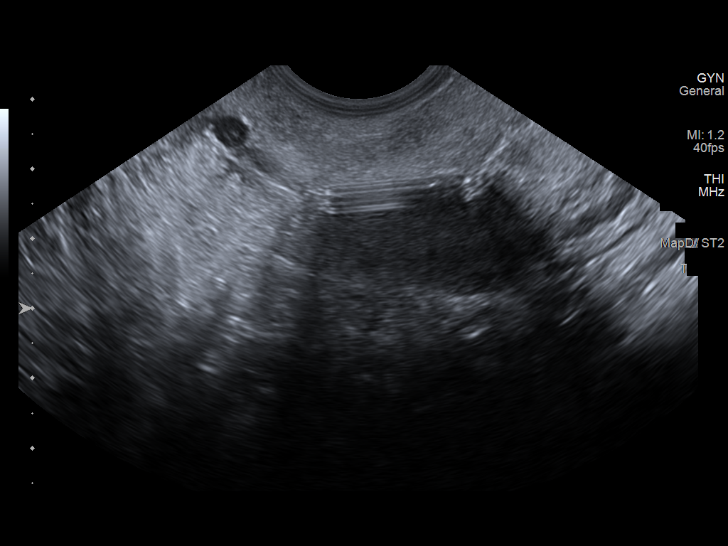
[im 94/126]
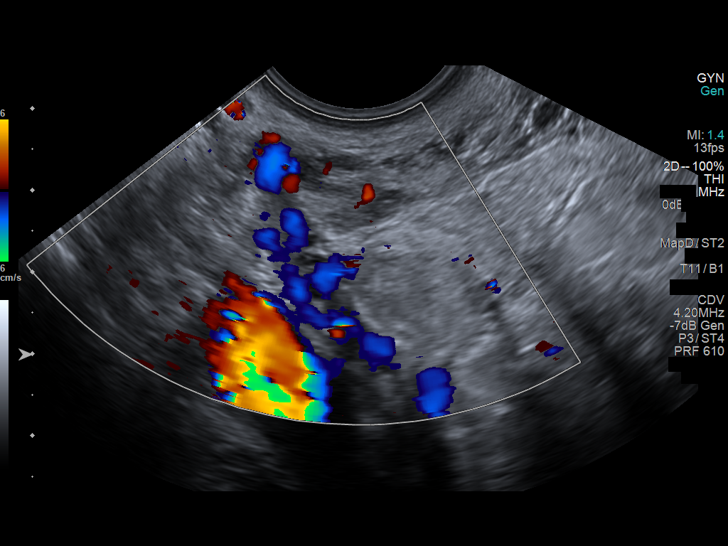
[im 105/126]
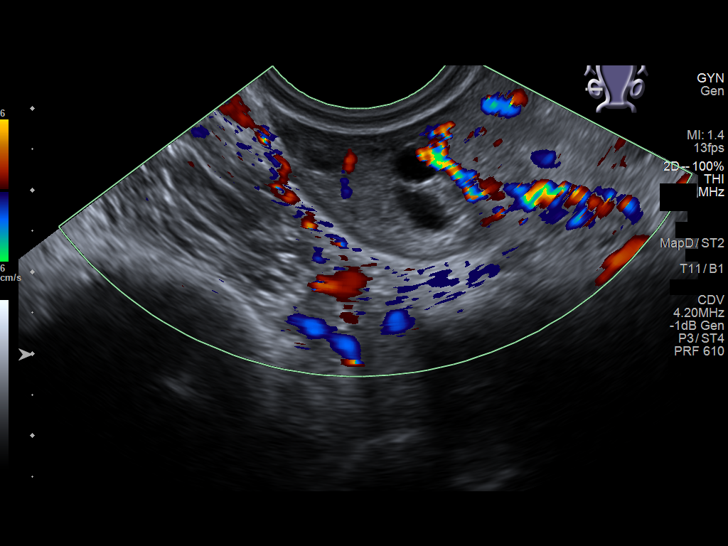
[im 115/126]
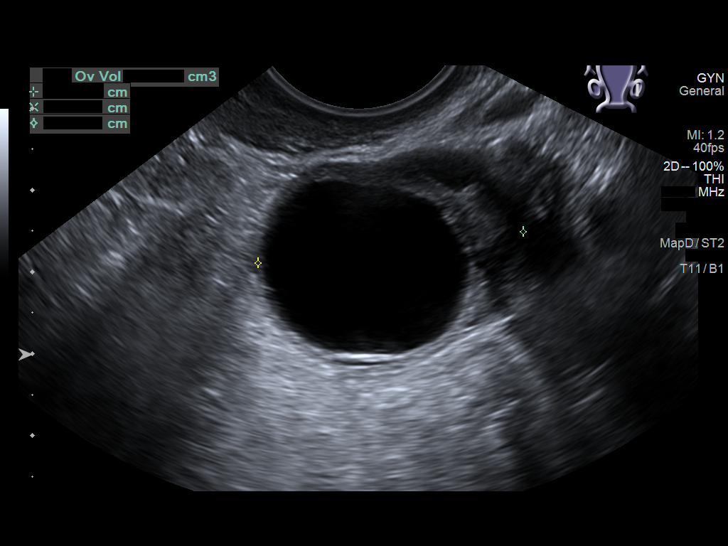
[im 126/126]
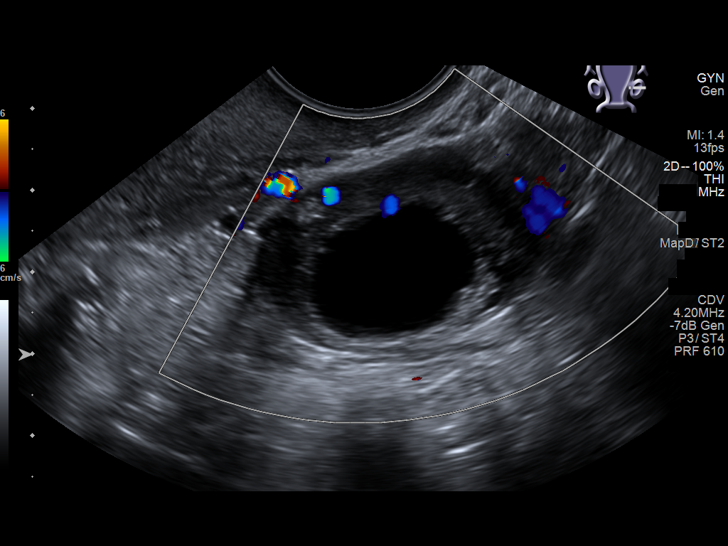

[13 of 25 positions shown; findings below may reference images not displayed]

FINDINGS: Uterus

Measurements: 7.0 x 2.9 x 5.0 cm = volume: 52 mL. No fibroids or
other mass visualized.

Endometrium

Thickness: 3.5 mm. No focal abnormality visualized. IUD seen within
the endometrial cavity, somewhat low lying at the level of the lower
uterine segment.

Right ovary

Measurements: 3.0 x 2.6 x 1.8 cm = volume: 7.3 mL. Previously seen
complex right ovarian cyst has continued to decrease in the interim,
now measuring 1.9 x 1.6 x 0.9 cm (previously 4.8 x 2.4 x 1.9 cm).
Additionally, previously seen complex internal loculated component
appears to have largely collapsed, and is now no longer clearly
seen.

Left ovary

Measurements: 3.3 x 2.6 x 3.3 cm = volume: 15.0 mL. 2.3 x 2.8 x
cm dominant follicle noted. No other adnexal mass.

Other findings

No abnormal free fluid.
IMPRESSION: 1. Marked interval decrease in size of complex right ovarian cystic
lesion, measuring 1.9 x 1.6 x 0.9 cm on today's exam versus 4.8 x
2.4 x 1.9 cm on 07/11/2020. Additionally, the previously seen
complex internal loculated component appears to have largely
collapsed, and is now no longer clearly seen. Findings are
reassuring and suggestive of a resolving benign cystic lesion. An
additional short interval follow-up ultrasound in 6-12 weeks could
be performed to ensure complete resolution as clinically warranted.
2. Otherwise unremarkable and normal pelvic ultrasound.

## 2021-05-07 ENCOUNTER — Ambulatory Visit (INDEPENDENT_AMBULATORY_CARE_PROVIDER_SITE_OTHER): Payer: BC Managed Care – PPO | Admitting: Obstetrics and Gynecology

## 2021-05-07 ENCOUNTER — Other Ambulatory Visit (HOSPITAL_COMMUNITY)
Admission: RE | Admit: 2021-05-07 | Discharge: 2021-05-07 | Disposition: A | Discharge status: Home / Self Care | Payer: Medicaid Other | Source: Ambulatory Visit | Attending: Obstetrics and Gynecology | Admitting: Obstetrics and Gynecology

## 2021-05-07 ENCOUNTER — Encounter: Payer: Self-pay | Admitting: Obstetrics and Gynecology

## 2021-05-07 ENCOUNTER — Other Ambulatory Visit: Payer: Self-pay

## 2021-05-07 VITALS — BP 104/71 | Ht 62.0 in | Wt 180.7 lb

## 2021-05-07 DIAGNOSIS — Z8632 Personal history of gestational diabetes: Secondary | ICD-10-CM | POA: Diagnosis not present

## 2021-05-07 DIAGNOSIS — E669 Obesity, unspecified: Secondary | ICD-10-CM | POA: Diagnosis not present

## 2021-05-07 DIAGNOSIS — Z01419 Encounter for gynecological examination (general) (routine) without abnormal findings: Secondary | ICD-10-CM | POA: Diagnosis not present

## 2021-05-07 DIAGNOSIS — N920 Excessive and frequent menstruation with regular cycle: Secondary | ICD-10-CM | POA: Diagnosis not present

## 2021-05-07 NOTE — Patient Instructions (Signed)
Preventive Care 21-34 Years Old, Female °Preventive care refers to lifestyle choices and visits with your health care provider that can promote health and wellness. Preventive care visits are also called wellness exams. °What can I expect for my preventive care visit? °Counseling °During your preventive care visit, your health care provider may ask about your: °Medical history, including: °Past medical problems. °Family medical history. °Pregnancy history. °Current health, including: °Menstrual cycle. °Method of birth control. °Emotional well-being. °Home life and relationship well-being. °Sexual activity and sexual health. °Lifestyle, including: °Alcohol, nicotine or tobacco, and drug use. °Access to firearms. °Diet, exercise, and sleep habits. °Work and work environment. °Sunscreen use. °Safety issues such as seatbelt and bike helmet use. °Physical exam °Your health care provider may check your: °Height and weight. These may be used to calculate your BMI (body mass index). BMI is a measurement that tells if you are at a healthy weight. °Waist circumference. This measures the distance around your waistline. This measurement also tells if you are at a healthy weight and may help predict your risk of certain diseases, such as type 2 diabetes and high blood pressure. °Heart rate and blood pressure. °Body temperature. °Skin for abnormal spots. °What immunizations do I need? °Vaccines are usually given at various ages, according to a schedule. Your health care provider will recommend vaccines for you based on your age, medical history, and lifestyle or other factors, such as travel or where you work. °What tests do I need? °Screening °Your health care provider may recommend screening tests for certain conditions. This may include: °Pelvic exam and Pap test. °Lipid and cholesterol levels. °Diabetes screening. This is done by checking your blood sugar (glucose) after you have not eaten for a while (fasting). °Hepatitis B  test. °Hepatitis C test. °HIV (human immunodeficiency virus) test. °STI (sexually transmitted infection) testing, if you are at risk. °BRCA-related cancer screening. This may be done if you have a family history of breast, ovarian, tubal, or peritoneal cancers. °Talk with your health care provider about your test results, treatment options, and if necessary, the need for more tests. °Follow these instructions at home: °Eating and drinking ° °Eat a healthy diet that includes fresh fruits and vegetables, whole grains, lean protein, and low-fat dairy products. °Take vitamin and mineral supplements as recommended by your health care provider. °Do not drink alcohol if: °Your health care provider tells you not to drink. °You are pregnant, may be pregnant, or are planning to become pregnant. °If you drink alcohol: °Limit how much you have to 0-1 drink a day. °Know how much alcohol is in your drink. In the [place] one drink equals one 12 oz bottle of beer (355 mL), one 5 oz glass of wine (148 mL), or one 1½ oz glass of hard liquor (44 mL). °Lifestyle °Brush your teeth every morning and night with fluoride toothpaste. Floss one time each day. °Exercise for at least 30 minutes 5 or more days each week. °Do not use any products that contain nicotine or tobacco. These products include cigarettes, chewing tobacco, and vaping devices, such as e-cigarettes. If you need help quitting, ask your health care provider. °Do not use drugs. °If you are sexually active, practice safe sex. Use a condom or other form of protection to prevent STIs. °If you do not wish to become pregnant, use a form of birth control. If you plan to become pregnant, see your health care provider for a prepregnancy visit. °Find healthy ways to manage stress, such as: °Meditation, yoga,   or listening to music. °Journaling. °Talking to a trusted person. °Spending time with friends and family. °Minimize exposure to UV radiation to reduce your risk of skin  cancer. °Safety °Always wear your seat belt while driving or riding in a vehicle. °Do not drive: °If you have been drinking alcohol. Do not ride with someone who has been drinking. °If you have been using any mind-altering substances or drugs. °While texting. °When you are tired or distracted. °Wear a helmet and other protective equipment during sports activities. °If you have firearms in your house, make sure you follow all gun safety procedures. °Seek help if you have been physically or sexually abused. °What's next? °Go to your health care provider once a year for an annual wellness visit. °Ask your health care provider how often you should have your eyes and teeth checked. °Stay up to date on all vaccines. °This information is not intended to replace advice given to you by your health care provider. Make sure you discuss any questions you have with your health care provider. °Document Revised: 10/15/2020 Document Reviewed: 10/15/2020 °[employer] Patient Education © 2022 [employer] ° °

## 2021-05-07 NOTE — Progress Notes (Signed)
GYNECOLOGY ANNUAL PHYSICAL EXAM PROGRESS NOTE  Subjective:    Jillian Anderson is a 36 y.o. [phone] married female who presents for an annual exam. The patient is sexually active. The patient participates in regular exercise: no. Has the patient ever been transfused or tattooed?: no. The patient reports that there is not domestic violence in her life.   Jillian Anderson has the following complaints today:  Issues with cycles (heavy bleeding on days 1 and 2). Cycles last 7-10 days. Now occurring between q 21-28 days. States that this has been ongoing since September of last year. Of note, patient underwent tubal ligation in August.   Recently sick over holidays with flu.   Menstrual History: Menarche age: 76 Patient's last menstrual period was 05/03/2021. Period Duration (Days): 7 Period Pattern: Regular Menstrual Flow: Heavy Menstrual Control: Tampon, Maxi pad Dysmenorrhea: (!) Moderate Dysmenorrhea Symptoms: Cramping, Throbbing   Gynecologic History:  Contraception: tubal ligation History of STI's: Denies Last Pap: 05/26/2017. Results were: normal.  Denies h/o abnormal pap smears.   OB History  Gravida Para Term Preterm AB Living  3 2 1 1 1 2   SAB IAB Ectopic Multiple Live Births  1 0 0 0 2    # Outcome Date GA Lbr Len/2nd Weight Sex Delivery Anes PTL Lv  3 Term 05/15/19 [redacted]w[redacted]d  6 lb 8.4 oz (2.96 kg) M Vag-Spont None N LIV     Complications: Hypertension, GDM (gestational diabetes mellitus)     Name: Jillian Anderson      Apgar1: 8  Apgar5: 9  2 Preterm 11/05/17 [redacted]w[redacted]d / 00:07 4 lb 10.8 oz (2.12 kg) M Vag-Spont Local Y LIV     Complications: Preeclampsia     Name: Jillian Anderson      Apgar1: 8  Apgar5: 9  1 SAB             Past Medical History:  Diagnosis Date   Allergy    Anemia    Asthma without status asthmaticus 03/15/2016   Astigmatism    COVID-19 04/2020   GERD (gastroesophageal reflux disease)    Gestational diabetes    History of kidney stones    In the past   IUGR  (intrauterine growth restriction) affecting care of mother 11/03/2017   Migraines    Polycystic disease, ovaries    Polycystic ovarian syndrome 03/15/2016   Pre-eclampsia affecting pregnancy, antepartum 11/01/2017   Tachycardia    during pregnancy    Past Surgical History:  Procedure Laterality Date   DILATION AND EVACUATION N/A 04/23/2016   Procedure: DILATATION AND EVACUATION;  Surgeon: Brayton Mars, MD;  Location: [facility];  Service: Gynecology;  Laterality: N/A;   EXTRACORPOREAL SHOCK WAVE LITHOTRIPSY     IUD REMOVAL N/A 12/25/2020   Procedure: INTRAUTERINE DEVICE (IUD) REMOVAL;  Surgeon: [name], MD;  Location: [facility];  Service: Gynecology;  Laterality: N/A;   LAPAROSCOPIC TUBAL LIGATION Bilateral 12/25/2020   Procedure: LAPAROSCOPIC TUBAL LIGATION;  Surgeon: [name], MD;  Location: [facility];  Service: Gynecology;  Laterality: Bilateral;   WISDOM TOOTH EXTRACTION      Family History  Problem Relation Age of Onset   Hypertension Mother    Hyperlipidemia Father    Diabetes Father    Asthma Brother    Breast cancer Neg Hx    Ovarian cancer Neg Hx    Colon cancer Neg Hx     Social History   Socioeconomic History   Marital status: Married    Spouse name: Jillian Anderson  Number of children: 2   Years of education: Not on file   Highest education level: Not on file  Occupational History   Not on file  Tobacco Use   Smoking status: Never   Smokeless tobacco: Never  Vaping Use   Vaping Use: Never used  Substance and Sexual Activity   Alcohol use: Yes    Alcohol/week: 1.0 standard drink    Types: 1 Glasses of wine per week    Comment: RARELY   Drug use: No   Sexual activity: Yes    Birth control/protection: I.U.D.    Comment: inserted 06/26/2019  Other Topics Concern   Not on file  Social History Narrative   Not on file   Social Determinants of Health   Financial Resource Strain: Not on file  Food Insecurity: Not on file  Transportation Needs:  Not on file  Physical Activity: Not on file  Stress: Not on file  Social Connections: Not on file  Intimate Partner Violence: Not on file    Current Outpatient Medications on File Prior to Visit  Medication Sig Dispense Refill   acidophilus (RISAQUAD) CAPS capsule Take by mouth daily.     cetirizine (ZYRTEC) 10 MG tablet Take 10 mg by mouth daily.     ibuprofen (ADVIL) 600 MG tablet Take 1 tablet (600 mg total) by mouth every 6 (six) hours as needed. 30 tablet 1   Multiple Vitamin (MULTIVITAMIN WITH MINERALS) TABS tablet Take 1 tablet by mouth daily.     No current facility-administered medications on file prior to visit.    Allergies  Allergen Reactions   Ceclor [Cefaclor] Hives   Codeine     Body fights itself eyes dilate   Red Dye Hives and Nausea Only    Migraines   Blackberry Flavor Nausea And Vomiting and Rash   Raspberry Nausea And Vomiting and Rash     Review of Systems Constitutional: negative for chills, fatigue, fevers and sweats Eyes: negative for irritation, redness and visual disturbance Ears, nose, mouth, throat, and face: negative for hearing loss, nasal congestion, snoring and tinnitus Respiratory: negative for asthma, cough, sputum Cardiovascular: negative for chest pain, dyspnea, exertional chest pressure/discomfort, irregular heart beat, palpitations and syncope Gastrointestinal: negative for abdominal pain, change in bowel habits, nausea and vomiting Genitourinary: positive for abnormal menstrual periods (see HPI). Negative for genital lesions, sexual problems and vaginal discharge, dysuria and urinary incontinence Integument/breast: negative for breast lump, breast tenderness and nipple discharge Hematologic/lymphatic: negative for bleeding and easy bruising Musculoskeletal:negative for back pain and muscle weakness Neurological: negative for dizziness, headaches, vertigo and weakness Endocrine: negative for diabetic symptoms including polydipsia,  polyuria and skin dryness Allergic/Immunologic: negative for hay fever and urticaria      Objective:  Blood pressure 104/71, height 5\' 2"  (1.575 m), weight 180 lb 11.2 oz (82 kg), last menstrual period 05/03/2021, currently breastfeeding.  Body mass index is 33.05 kg/m.    General Appearance:    Alert, cooperative, no distress, appears stated age, mild obesity  Head:    Normocephalic, without obvious abnormality, atraumatic  Eyes:    PERRL, conjunctiva/corneas clear, EOM's intact, both eyes  Ears:    Normal external ear canals, both ears  Nose:   Nares normal, septum midline, mucosa normal, no drainage or sinus tenderness  Throat:   Lips, mucosa, and tongue normal; teeth and gums normal  Neck:   Supple, symmetrical, trachea midline, no adenopathy; thyroid: no enlargement/tenderness/nodules; no carotid bruit or JVD  Back:  Symmetric, no curvature, ROM normal, no CVA tenderness  Lungs:     Clear to auscultation bilaterally, respirations unlabored  Chest Wall:    No tenderness or deformity   Heart:    Regular rate and rhythm, S1 and S2 normal, no murmur, rub or gallop  Breast Exam:    No tenderness, masses, or nipple abnormality  Abdomen:     Soft, non-tender, bowel sounds active all four quadrants, no masses, no organomegaly.    Genitalia:    Pelvic:external genitalia normal, vagina without lesions, discharge, or tenderness, rectovaginal septum  normal. Cervix normal in appearance, no cervical motion tenderness, no adnexal masses or tenderness.  Uterus normal size, shape, mobile, regular contours, nontender.  Rectal:    Normal external sphincter.  No hemorrhoids appreciated. Internal exam not done.   Extremities:   Extremities normal, atraumatic, no cyanosis or edema  Pulses:   2+ and symmetric all extremities  Skin:   Skin color, texture, turgor normal, no rashes or lesions  Lymph nodes:   Cervical, supraclavicular, and axillary nodes normal  Neurologic:   CNII-XII intact, normal  strength, sensation and reflexes throughout   .  Labs:  Lab Results  Component Value Date   WBC 7.7 12/19/2020   HGB 13.3 12/19/2020   HCT 39.8 12/19/2020   MCV 92.3 12/19/2020   PLT 204 12/19/2020    Lab Results  Component Value Date   CREATININE 0.84 12/19/2020   BUN 10 12/19/2020   NA 136 12/19/2020   K 3.5 12/19/2020   CL 102 12/19/2020   CO2 26 12/19/2020    Lab Results  Component Value Date   ALT 20 06/11/2020   AST 18 06/11/2020   ALKPHOS 60 06/11/2020   BILITOT 0.7 06/11/2020    Lab Results  Component Value Date   TSH 1.660 05/21/2020   Lab Results  Component Value Date   HGBA1C 4.9 05/21/2020     Assessment:   1. Well woman exam with routine gynecological exam   2. History of gestational diabetes   3. Menorrhagia with regular cycle   4. Obesity (BMI 30.0-34.9)      Plan:  - Blood tests: CBC with diff, Comprehensive metabolic panel, and [id number]. - Breast self exam technique reviewed and patient encouraged to perform self-exam monthly. - Contraception: tubal ligation. - Discussed healthy lifestyle modifications. - Pap smear ordered. - COVID vaccination status: Declined - Flu vaccine: declined.  - Menorrhagia since September, discussed options for management. Offered Lysteda vs IUD or other hormonal contraceptive. Also advised that she can try scheduled Ibuprofen. Will think over options.  - H/o GDM, will screen for DM today.  - Follow up in 1 year for annual exam    [name], MD [facility]

## 2021-05-08 ENCOUNTER — Encounter: Payer: BC Managed Care – PPO | Admitting: Obstetrics and Gynecology

## 2021-05-08 LAB — CBC
Hematocrit: 41.3 % (ref 34.0–46.6)
Hemoglobin: 13.8 g/dL (ref 11.1–15.9)
MCH: 29.6 pg (ref 26.6–33.0)
MCHC: 33.4 g/dL (ref 31.5–35.7)
MCV: 89 fL (ref 79–97)
Platelets: 262 10*3/uL (ref 150–450)
RBC: 4.66 x10E6/uL (ref 3.77–5.28)
RDW: 12.4 % (ref 11.7–15.4)
WBC: 7.2 10*3/uL (ref 3.4–10.8)

## 2021-05-08 LAB — COMPREHENSIVE METABOLIC PANEL
ALT: 11 IU/L (ref 0–32)
AST: 14 IU/L (ref 0–40)
Albumin/Globulin Ratio: 1.7 (ref 1.2–2.2)
Albumin: 4.5 g/dL (ref 3.8–4.8)
Alkaline Phosphatase: 85 IU/L (ref 44–121)
BUN/Creatinine Ratio: 16 (ref 9–23)
BUN: 15 mg/dL (ref 6–20)
Bilirubin Total: 0.2 mg/dL (ref 0.0–1.2)
CO2: 26 mmol/L (ref 20–29)
Calcium: 9.6 mg/dL (ref 8.7–10.2)
Chloride: 102 mmol/L (ref 96–106)
Creatinine, Ser: 0.92 mg/dL (ref 0.57–1.00)
Globulin, Total: 2.6 g/dL (ref 1.5–4.5)
Glucose: 82 mg/dL (ref 70–99)
Potassium: 4.1 mmol/L (ref 3.5–5.2)
Sodium: 139 mmol/L (ref 134–144)
Total Protein: 7.1 g/dL (ref 6.0–8.5)
eGFR: 84 mL/min/{1.73_m2} (ref >59)

## 2021-05-08 LAB — HEMOGLOBIN A1C
Est. average glucose Bld gHb Est-mCnc: 100 mg/dL
Hgb A1c MFr Bld: 5.1 % (ref 4.8–5.6)

## 2021-05-13 LAB — CYTOLOGY - PAP
Comment: NEGATIVE
Diagnosis: NEGATIVE
Diagnosis: REACTIVE
High risk HPV: NEGATIVE

## 2022-01-29 ENCOUNTER — Ambulatory Visit
Admission: EM | Admit: 2022-01-29 | Discharge: 2022-01-29 | Disposition: A | Discharge status: Home / Self Care | Payer: BC Managed Care – PPO | Attending: Urgent Care | Admitting: Urgent Care

## 2022-01-29 DIAGNOSIS — N3000 Acute cystitis without hematuria: Secondary | ICD-10-CM | POA: Insufficient documentation

## 2022-01-29 DIAGNOSIS — R300 Dysuria: Secondary | ICD-10-CM | POA: Insufficient documentation

## 2022-01-29 LAB — POCT URINALYSIS DIP (MANUAL ENTRY)
Bilirubin, UA: NEGATIVE
Blood, UA: NEGATIVE
Glucose, UA: NEGATIVE mg/dL
Ketones, POC UA: NEGATIVE mg/dL
Nitrite, UA: NEGATIVE
Spec Grav, UA: 1.025 (ref 1.010–1.025)
Urobilinogen, UA: 0.2 E.U./dL
pH, UA: 6.5 (ref 5.0–8.0)

## 2022-01-29 MED ORDER — CIPROFLOXACIN HCL 500 MG PO TABS
500.0000 mg | ORAL_TABLET | Freq: Two times a day (BID) | ORAL | 0 refills | Status: AC
Start: 2022-01-29 — End: 2022-02-03

## 2022-01-29 NOTE — Discharge Instructions (Addendum)
Your urine was sent to the lab to confirm the antibiotic ordered will be effective against the bacteria in your urine.  Follow up here or with your primary care provider if your symptoms are worsening or not improving with treatment.

## 2022-01-29 NOTE — ED Provider Notes (Signed)
[facility]    CSN: [id number] Arrival date & time: 01/29/22  1210      History   Chief Complaint Chief Complaint  Patient presents with   Urinary Frequency    UTI- burning, bladder spasms.Possible Sinus Infection. - Entered by patient   Dysuria   Facial Pain    HPI Jillian Anderson is a 36 y.o. female.   HPI  Presents to urgent care with report of dysuria x3 days.  She endorses burning with urination and bladder spasms, frequency.  Also is complaining of sinus pain and pressure and left ear pain.  Past Medical History:  Diagnosis Date   Allergy    Anemia    Asthma without status asthmaticus 03/15/2016   Astigmatism    COVID-19 04/2020   GERD (gastroesophageal reflux disease)    Gestational diabetes    History of kidney stones    In the past   IUGR (intrauterine growth restriction) affecting care of mother 11/03/2017   Migraines    Polycystic disease, ovaries    Polycystic ovarian syndrome 03/15/2016   Pre-eclampsia affecting pregnancy, antepartum 11/01/2017   Tachycardia    during pregnancy    Patient Active Problem List   Diagnosis Date Noted   Acute sinusitis 02/19/2020   Suspected COVID-19 virus infection 02/19/2020   Ear pain 06/04/2016   Hyperlipidemia 06/04/2016   Obesity (BMI 30.0-34.9) 03/15/2016   Mild intermittent asthma without complication 35/57/3220   Folliculitis 25/42/7062   Dermatitis 09/12/2015   GERD (gastroesophageal reflux disease) 08/01/2013   Kidney stone 09/03/2011   Palpitations 07/08/2011   IBS (irritable bowel syndrome) 07/08/2011   Migraines 01/06/2011   Allergic rhinitis 01/06/2011    Past Surgical History:  Procedure Laterality Date   DILATION AND EVACUATION N/A 04/23/2016   Procedure: DILATATION AND EVACUATION;  Surgeon: Brayton Mars, MD;  Location: [facility];  Service: Gynecology;  Laterality: N/A;   EXTRACORPOREAL SHOCK WAVE LITHOTRIPSY     IUD REMOVAL N/A 12/25/2020   Procedure: INTRAUTERINE  DEVICE (IUD) REMOVAL;  Surgeon: [name], MD;  Location: [facility];  Service: Gynecology;  Laterality: N/A;   LAPAROSCOPIC TUBAL LIGATION Bilateral 12/25/2020   Procedure: LAPAROSCOPIC TUBAL LIGATION;  Surgeon: [name], MD;  Location: [facility];  Service: Gynecology;  Laterality: Bilateral;   WISDOM TOOTH EXTRACTION      OB History     Gravida  3   Para  2   Term  1   Preterm  1   AB  1   Living  2      SAB  1   IAB      Ectopic      Multiple  0   Live Births  2            Home Medications    Prior to Admission medications   Medication Sig Start Date End Date Taking? Authorizing Provider  acidophilus (RISAQUAD) CAPS capsule Take by mouth daily.    [provider]  cetirizine (ZYRTEC) 10 MG tablet Take 10 mg by mouth daily.    [provider]  ibuprofen (ADVIL) 600 MG tablet Take 1 tablet (600 mg total) by mouth every 6 (six) hours as needed. 12/25/20   [name], MD  Multiple Vitamin (MULTIVITAMIN WITH MINERALS) TABS tablet Take 1 tablet by mouth daily.    [provider]    Family History Family History  Problem Relation Age of Onset   Hypertension Mother    Hyperlipidemia Father  Diabetes Father    Asthma Brother    Breast cancer Neg Hx    Ovarian cancer Neg Hx    Colon cancer Neg Hx     Social History Social History   Tobacco Use   Smoking status: Never   Smokeless tobacco: Never  Vaping Use   Vaping Use: Never used  Substance Use Topics   Alcohol use: Yes    Alcohol/week: 1.0 standard drink of alcohol    Types: 1 Glasses of wine per week    Comment: RARELY   Drug use: No     Allergies   Ceclor [cefaclor], Codeine, Red dye, Blackberry flavor, and Raspberry   Review of Systems Review of Systems   Physical Exam Triage Vital Signs ED Triage Vitals [01/29/22 1234]  Enc Vitals Group     BP 111/73     Pulse Rate 82     Resp 17     Temp 98.1 F (36.7 C)     Temp src      SpO2 97 %      Weight      Height      Head Circumference      Peak Flow      Pain Score 4     Pain Loc      Pain Edu?      Excl. in GC?    No data found.  Updated Vital Signs BP 111/73   Pulse 82   Temp 98.1 F (36.7 C)   Resp 17   LMP 01/06/2022   SpO2 97%   Visual Acuity Right Eye Distance:   Left Eye Distance:   Bilateral Distance:    Right Eye Near:   Left Eye Near:    Bilateral Near:     Physical Exam   UC Treatments / Results  Labs (all labs ordered are listed, but only abnormal results are displayed) Labs Reviewed - No data to display  EKG   Radiology No results found.  Procedures Procedures (including critical care time)  Medications Ordered in UC Medications - No data to display  Initial Impression / Assessment and Plan / UC Course  I have reviewed the triage vital signs and the nursing notes.  Pertinent labs & imaging results that were available during my care of the patient were reviewed by me and considered in my medical decision making (see chart for details).   UA is positive for large leukocytes and cloudy urine with significant sedimentation.  Will treat acute cystitis with ciprofloxacin which will cover any bacterial rhinosinusitis as well.  Sending urine for culture to verify susceptibility.   Final Clinical Impressions(s) / UC Diagnoses   Final diagnoses:  None   Discharge Instructions   None    ED Prescriptions   None    PDMP not reviewed this encounter.   Charma Igo, [place] 01/29/22 1256

## 2022-01-29 NOTE — ED Triage Notes (Signed)
Pt. States that for the past 3 days she has been experiencing burning during urination, and bladder spasms. Pt. Is also c/o sinus pressure.

## 2022-01-30 LAB — URINE CULTURE

## 2022-02-17 ENCOUNTER — Encounter: Payer: Self-pay | Admitting: Obstetrics and Gynecology

## 2022-06-10 ENCOUNTER — Ambulatory Visit: Payer: Self-pay | Admitting: Obstetrics and Gynecology

## 2022-07-26 NOTE — Patient Instructions (Signed)
Preventive Care 21-35 Years Old, Female Preventive care refers to lifestyle choices and visits with your health care provider that can promote health and wellness. Preventive care visits are also called wellness exams. What can I expect for my preventive care visit? Counseling During your preventive care visit, your health care provider may ask about your: Medical history, including: Past medical problems. Family medical history. Pregnancy history. Current health, including: Menstrual cycle. Method of birth control. Emotional well-being. Home life and relationship well-being. Sexual activity and sexual health. Lifestyle, including: Alcohol, nicotine or tobacco, and drug use. Access to firearms. Diet, exercise, and sleep habits. Work and work environment. Sunscreen use. Safety issues such as seatbelt and bike helmet use. Physical exam Your health care provider may check your: Height and weight. These may be used to calculate your BMI (body mass index). BMI is a measurement that tells if you are at a healthy weight. Waist circumference. This measures the distance around your waistline. This measurement also tells if you are at a healthy weight and may help predict your risk of certain diseases, such as type 2 diabetes and high blood pressure. Heart rate and blood pressure. Body temperature. Skin for abnormal spots. What immunizations do I need?  Vaccines are usually given at various ages, according to a schedule. Your health care provider will recommend vaccines for you based on your age, medical history, and lifestyle or other factors, such as travel or where you work. What tests do I need? Screening Your health care provider may recommend screening tests for certain conditions. This may include: Pelvic exam and Pap test. Lipid and cholesterol levels. Diabetes screening. This is done by checking your blood sugar (glucose) after you have not eaten for a while (fasting). Hepatitis  B test. Hepatitis C test. HIV (human immunodeficiency virus) test. STI (sexually transmitted infection) testing, if you are at risk. BRCA-related cancer screening. This may be done if you have a family history of breast, ovarian, tubal, or peritoneal cancers. Talk with your health care provider about your test results, treatment options, and if necessary, the need for more tests. Follow these instructions at home: Eating and drinking  Eat a healthy diet that includes fresh fruits and vegetables, whole grains, lean protein, and low-fat dairy products. Take vitamin and mineral supplements as recommended by your health care provider. Do not drink alcohol if: Your health care provider tells you not to drink. You are pregnant, may be pregnant, or are planning to become pregnant. If you drink alcohol: Limit how much you have to 0-1 drink a day. Know how much alcohol is in your drink. In the [place] one drink equals one 12 oz bottle of beer (355 mL), one 5 oz glass of wine (148 mL), or one 1 oz glass of hard liquor (44 mL). Lifestyle Brush your teeth every morning and night with fluoride toothpaste. Floss one time each day. Exercise for at least 30 minutes 5 or more days each week. Do not use any products that contain nicotine or tobacco. These products include cigarettes, chewing tobacco, and vaping devices, such as e-cigarettes. If you need help quitting, ask your health care provider. Do not use drugs. If you are sexually active, practice safe sex. Use a condom or other form of protection to prevent STIs. If you do not wish to become pregnant, use a form of birth control. If you plan to become pregnant, see your health care provider for a prepregnancy visit. Find healthy ways to manage stress, such as: Meditation,   yoga, or listening to music. Journaling. Talking to a trusted person. Spending time with friends and family. Minimize exposure to UV radiation to reduce your risk of skin  cancer. Safety Always wear your seat belt while driving or riding in a vehicle. Do not drive: If you have been drinking alcohol. Do not ride with someone who has been drinking. If you have been using any mind-altering substances or drugs. While texting. When you are tired or distracted. Wear a helmet and other protective equipment during sports activities. If you have firearms in your house, make sure you follow all gun safety procedures. Seek help if you have been physically or sexually abused. What's next? Go to your health care provider once a year for an annual wellness visit. Ask your health care provider how often you should have your eyes and teeth checked. Stay up to date on all vaccines. This information is not intended to replace advice given to you by your health care provider. Make sure you discuss any questions you have with your health care provider. Document Revised: 10/15/2020 Document Reviewed: 10/15/2020 Elsevier Patient Education  2023 Else[employer] Inc. Breast Self-Awareness Breast self-awareness is knowing how your breasts look and feel. You need to: Check your breasts on a regular basis. Tell your doctor about any changes. Become familiar with the look and feel of your breasts. This can help you catch a breast problem while it is still small and can be treated. You should do breast self-exams even if you have breast implants. What you need: A mirror. A well-lit room. A pillow or other soft object. How to do a breast self-exam Follow these steps to do a breast self-exam: Look for changes  Take off all the clothes above your waist. Stand in front of a mirror in a room with good lighting. Put your hands down at your sides. Compare your breasts in the mirror. Look for any difference between them, such as: A difference in shape. A difference in size. Wrinkles, dips, and bumps in one breast and not the other. Look at each breast for changes in the skin, such  as: Redness. Scaly areas. Skin that has gotten thicker. Dimpling. Open sores (ulcers). Look for changes in your nipples, such as: Fluid coming out of a nipple. Fluid around a nipple. Bleeding. Dimpling. Redness. A nipple that looks pushed in (retracted), or that has changed position. Feel for changes Lie on your back. Feel each breast. To do this: Pick a breast to feel. Place a pillow under the shoulder closest to that breast. Put the arm closest to that breast behind your head. Feel the nipple area of that breast using the hand of your other arm. Feel the area with the pads of your three middle fingers by making small circles with your fingers. Use light, medium, and firm pressure. Continue the overlapping circles, moving downward over the breast. Keep making circles with your fingers. Stop when you feel your ribs. Start making circles with your fingers again, this time going upward until you reach your collarbone. Then, make circles outward across your breast and into your armpit area. Squeeze your nipple. Check for discharge and lumps. Repeat these steps to check your other breast. Sit or stand in the tub or shower. With soapy water on your skin, feel each breast the same way you did when you were lying down. Write down what you find Writing down what you find can help you remember what to tell your doctor. Write down: What is   normal for each breast. Any changes you find in each breast. These include: The kind of changes you find. A tender or painful breast. Any lump you find. Write down its size and where it is. When you last had your monthly period (menstrual cycle). General tips If you are breastfeeding, the best time to check your breasts is after you feed your baby or after you use a breast pump. If you get monthly bleeding, the best time to check your breasts is 5-7 days after your monthly cycle ends. With time, you will become comfortable with the self-exam. You will  also start to know if there are changes in your breasts. Contact a doctor if: You see a change in the shape or size of your breasts or nipples. You see a change in the skin of your breast or nipples, such as red or scaly skin. You have fluid coming from your nipples that is not normal. You find a new lump or thick area. You have breast pain. You have any concerns about your breast health. Summary Breast self-awareness includes looking for changes in your breasts and feeling for changes within your breasts. You should do breast self-awareness in front of a mirror in a well-lit room. If you get monthly periods (menstrual cycles), the best time to check your breasts is 5-7 days after your period ends. Tell your doctor about any changes you see in your breasts. Changes include changes in size, changes on the skin, painful or tender breasts, or fluid from your nipples that is not normal. This information is not intended to replace advice given to you by your health care provider. Make sure you discuss any questions you have with your health care provider. Document Revised: 09/24/2021 Document Reviewed: 02/19/2021 Elsevier Patient Education  2023 Elsevier Inc.  

## 2022-07-26 NOTE — Progress Notes (Signed)
GYNECOLOGY ANNUAL PHYSICAL EXAM PROGRESS NOTE  Subjective:    Jillian Anderson is a 37 y.o. [phone] female who presents for an annual exam. The patient is sexually active. The patient participates in regular exercise: yes. Has the patient ever been transfused or tattooed?: yes. The patient reports that there is not domestic violence in her life.    The patient has the following complaints today.  She has been having some fatigue the week before her cycle begins.  Menstrual History: Menarche age: 62 Patient's last menstrual period was 07/15/2022 (exact date). Period Cycle (Days): 31 Period Duration (Days): 3-7 Period Pattern: Regular Menstrual Flow: Heavy, Moderate, Light Menstrual Control: Maxi pad, Tampon Menstrual Control Change Freq (Hours): 2-3 Dysmenorrhea: (!) Moderate Dysmenorrhea Symptoms: Cramping, Throbbing, Nausea, Headache     Gynecologic History:  Contraception: tubal ligation History of STI's: Denies Last Pap: 05/07/2021. Results were: normal.  Denies h/o abnormal pap smears. Last mammogram: Not age appropriate     OB History  Gravida Para Term Preterm AB Living  3 2 1 1 1 2   SAB IAB Ectopic Multiple Live Births  1 0 0 0 2    # Outcome Date GA Lbr Len/2nd Weight Sex Delivery Anes PTL Lv  3 Term 05/15/19 [redacted]w[redacted]d  6 lb 8.4 oz (2.96 kg) M Vag-Spont None N LIV     Complications: Hypertension, GDM (gestational diabetes mellitus)     Name: Anette Riedel      Apgar1: 8  Apgar5: 9  2 Preterm 11/05/17 [redacted]w[redacted]d / 00:07 4 lb 10.8 oz (2.12 kg) M Vag-Spont Local Y LIV     Complications: Preeclampsia, IUGR (intrauterine growth restriction) affecting care of mother     Name: Jillian Anderson      Apgar1: 8  Apgar5: 9  1 SAB             Past Medical History:  Diagnosis Date   Allergy    Anemia    Asthma without status asthmaticus 03/15/2016   Astigmatism    COVID-19 04/2020   GERD (gastroesophageal reflux disease)    Gestational diabetes    History of kidney stones    In the  past   IUGR (intrauterine growth restriction) affecting care of mother 11/03/2017   Migraines    Polycystic disease, ovaries    Polycystic ovarian syndrome 03/15/2016   Pre-eclampsia affecting pregnancy, antepartum 11/01/2017   Tachycardia    during pregnancy    Past Surgical History:  Procedure Laterality Date   DILATION AND EVACUATION N/A 04/23/2016   Procedure: DILATATION AND EVACUATION;  Surgeon: Herold Harms, MD;  Location: [facility];  Service: Gynecology;  Laterality: N/A;   EXTRACORPOREAL SHOCK WAVE LITHOTRIPSY     IUD REMOVAL N/A 12/25/2020   Procedure: INTRAUTERINE DEVICE (IUD) REMOVAL;  Surgeon: Hildred Laser, MD;  Location: [facility];  Service: Gynecology;  Laterality: N/A;   LAPAROSCOPIC TUBAL LIGATION Bilateral 12/25/2020   Procedure: LAPAROSCOPIC TUBAL LIGATION;  Surgeon: Hildred Laser, MD;  Location: [facility];  Service: Gynecology;  Laterality: Bilateral;   WISDOM TOOTH EXTRACTION      Family History  Problem Relation Age of Onset   Hypertension Mother    Hyperlipidemia Father    Diabetes Father    Asthma Brother    Breast cancer Neg Hx    Ovarian cancer Neg Hx    Colon cancer Neg Hx     Social History   Socioeconomic History   Marital status: Married    Spouse name: Gerri Spore   Number  of children: 2   Years of education: Not on file   Highest education level: Not on file  Occupational History   Not on file  Tobacco Use   Smoking status: Never   Smokeless tobacco: Never  Vaping Use   Vaping Use: Never used  Substance and Sexual Activity   Alcohol use: Yes    Alcohol/week: 1.0 standard drink of alcohol    Types: 1 Glasses of wine per week    Comment: RARELY   Drug use: No   Sexual activity: Yes    Birth control/protection: I.U.D.    Comment: inserted 06/26/2019  Other Topics Concern   Not on file  Social History Narrative   Not on file   Social Determinants of Health   Financial Resource Strain: Not on file  Food Insecurity: Not on file   Transportation Needs: Not on file  Physical Activity: Not on file  Stress: Not on file  Social Connections: Not on file  Intimate Partner Violence: Not on file    Current Outpatient Medications on File Prior to Visit  Medication Sig Dispense Refill   acidophilus (RISAQUAD) CAPS capsule Take by mouth daily.     cetirizine (ZYRTEC) 10 MG tablet Take 10 mg by mouth daily.     ibuprofen (ADVIL) 600 MG tablet Take 1 tablet (600 mg total) by mouth every 6 (six) hours as needed. 30 tablet 1   Multiple Vitamin (MULTIVITAMIN WITH MINERALS) TABS tablet Take 1 tablet by mouth daily.     No current facility-administered medications on file prior to visit.    Allergies  Allergen Reactions   Ceclor [Cefaclor] Hives   Codeine     Body fights itself eyes dilate   Red Dye Hives and Nausea Only    Migraines   Blackberry Flavor Nausea And Vomiting and Rash   Raspberry Nausea And Vomiting and Rash     Review of Systems Constitutional: negative for chills, fatigue, fevers and sweats Eyes: negative for irritation, redness and visual disturbance Ears, nose, mouth, throat, and face: negative for hearing loss, nasal congestion, snoring and tinnitus Respiratory: negative for asthma, cough, sputum Cardiovascular: negative for chest pain, dyspnea, exertional chest pressure/discomfort, irregular heart beat, palpitations and syncope Gastrointestinal: negative for abdominal pain, change in bowel habits, nausea and vomiting Genitourinary: negative for abnormal menstrual periods, genital lesions, sexual problems and vaginal discharge, dysuria and urinary incontinence Integument/breast: negative for breast lump, breast tenderness and nipple discharge Hematologic/lymphatic: negative for bleeding and easy bruising Musculoskeletal:negative for back pain and muscle weakness Neurological: negative for dizziness, headaches, vertigo and weakness Endocrine: negative for diabetic symptoms including polydipsia,  polyuria and skin dryness Allergic/Immunologic: negative for hay fever and urticaria      Objective:  currently breastfeeding. Body mass index is 32.14 kg/m. Blood pressure 105/82, pulse 83, resp. rate 16, height 5\' 2"  (1.575 m), weight 175 lb 11.2 oz (79.7 kg), last menstrual period 07/15/2022, currently breastfeeding.    General Appearance:    Alert, cooperative, no distress, appears stated age  Head:    Normocephalic, without obvious abnormality, atraumatic  Eyes:    PERRL, conjunctiva/corneas clear, EOM's intact, both eyes  Ears:    Normal external ear canals, both ears  Nose:   Nares normal, septum midline, mucosa normal, no drainage or sinus tenderness  Throat:   Lips, mucosa, and tongue normal; teeth and gums normal  Neck:   Supple, symmetrical, trachea midline, no adenopathy; thyroid: no enlargement/tenderness/nodules; no carotid bruit or JVD  Back:  Symmetric, no curvature, ROM normal, no CVA tenderness  Lungs:     Clear to auscultation bilaterally, respirations unlabored  Chest Wall:    No tenderness or deformity   Heart:    Regular rate and rhythm, S1 and S2 normal, no murmur, rub or gallop  Breast Exam:    No tenderness, masses, or nipple abnormality  Abdomen:     Soft, non-tender, bowel sounds active all four quadrants, no masses, no organomegaly.    Genitalia:    Pelvic:external genitalia normal, vagina without lesions, discharge, or tenderness, rectovaginal septum  normal. Cervix normal in appearance, no cervical motion tenderness, no adnexal masses or tenderness.  Uterus normal size, shape, mobile, regular contours, nontender.  Rectal:    Normal external sphincter.  No hemorrhoids appreciated. Internal exam not done.   Extremities:   Extremities normal, atraumatic, no cyanosis or edema  Pulses:   2+ and symmetric all extremities  Skin:   Skin color, texture, turgor normal, no rashes or lesions  Lymph nodes:   Cervical, supraclavicular, and axillary nodes normal   Neurologic:   CNII-XII intact, normal strength, sensation and reflexes throughout   .  Labs:  Lab Results  Component Value Date   WBC 7.2 05/07/2021   HGB 13.8 05/07/2021   HCT 41.3 05/07/2021   MCV 89 05/07/2021   PLT 262 05/07/2021    Lab Results  Component Value Date   CREATININE 0.92 05/07/2021   BUN 15 05/07/2021   NA 139 05/07/2021   K 4.1 05/07/2021   CL 102 05/07/2021   CO2 26 05/07/2021    Lab Results  Component Value Date   ALT 11 05/07/2021   AST 14 05/07/2021   ALKPHOS 85 05/07/2021   BILITOT 0.2 05/07/2021    Lab Results  Component Value Date   TSH 1.660 05/21/2020     Assessment:   1. Well woman exam with routine gynecological exam   2. Hyperlipidemia, unspecified hyperlipidemia type   3. Screening for diabetes mellitus (DM)   4. Need for hepatitis C screening test   5. Fatigue, unspecified type      Plan:  - Blood tests: see orders - Breast self exam technique reviewed and patient encouraged to perform self-exam monthly. - Contraception: tubal ligation. - Discussed healthy lifestyle modifications and self breast exams - Mammogram  : Not age appropriate - Pap smear  UTD . - COVID vaccination status: Declines - Discussed management options for fatigue prior to onset of menses, including vitamin supplementation (Magnesium, B12). Will also check levels today.  - Hyperlipidemia mild, not on medications at this time.  - Follow up in 1 year for annual exam   Hildred Laser, MD [facility]

## 2022-07-28 ENCOUNTER — Ambulatory Visit (INDEPENDENT_AMBULATORY_CARE_PROVIDER_SITE_OTHER): Payer: No Typology Code available for payment source | Admitting: Obstetrics and Gynecology

## 2022-07-28 ENCOUNTER — Encounter: Payer: Self-pay | Admitting: Obstetrics and Gynecology

## 2022-07-28 VITALS — BP 105/82 | HR 83 | Resp 16 | Ht 62.0 in | Wt 175.7 lb

## 2022-07-28 DIAGNOSIS — Z131 Encounter for screening for diabetes mellitus: Secondary | ICD-10-CM

## 2022-07-28 DIAGNOSIS — Z1159 Encounter for screening for other viral diseases: Secondary | ICD-10-CM

## 2022-07-28 DIAGNOSIS — E785 Hyperlipidemia, unspecified: Secondary | ICD-10-CM

## 2022-07-28 DIAGNOSIS — R5383 Other fatigue: Secondary | ICD-10-CM

## 2022-07-28 DIAGNOSIS — Z01419 Encounter for gynecological examination (general) (routine) without abnormal findings: Secondary | ICD-10-CM

## 2022-07-29 LAB — COMPREHENSIVE METABOLIC PANEL
ALT: 16 IU/L (ref 0–32)
AST: 16 IU/L (ref 0–40)
Albumin/Globulin Ratio: 1.3 (ref 1.2–2.2)
Albumin: 4.2 g/dL (ref 3.9–4.9)
Alkaline Phosphatase: 86 IU/L (ref 44–121)
BUN/Creatinine Ratio: 16 (ref 9–23)
BUN: 14 mg/dL (ref 6–20)
Bilirubin Total: 0.4 mg/dL (ref 0.0–1.2)
CO2: 25 mmol/L (ref 20–29)
Calcium: 9.7 mg/dL (ref 8.7–10.2)
Chloride: 102 mmol/L (ref 96–106)
Creatinine, Ser: 0.88 mg/dL (ref 0.57–1.00)
Globulin, Total: 3.2 g/dL (ref 1.5–4.5)
Glucose: 87 mg/dL (ref 70–99)
Potassium: 4.2 mmol/L (ref 3.5–5.2)
Sodium: 138 mmol/L (ref 134–144)
Total Protein: 7.4 g/dL (ref 6.0–8.5)
eGFR: 88 mL/min/{1.73_m2} (ref >59)

## 2022-07-29 LAB — LIPID PANEL
Chol/HDL Ratio: 3.3 ratio (ref 0.0–4.4)
Cholesterol, Total: 234 mg/dL — ABNORMAL HIGH (ref 100–199)
HDL: 70 mg/dL (ref >39)
LDL Chol Calc (NIH): 149 mg/dL — ABNORMAL HIGH (ref 0–99)
Triglycerides: 85 mg/dL (ref 0–149)
VLDL Cholesterol Cal: 15 mg/dL (ref 5–40)

## 2022-07-29 LAB — TSH: TSH: 1.28 u[IU]/mL (ref 0.450–4.500)

## 2022-07-29 LAB — CBC
Hematocrit: 41.1 % (ref 34.0–46.6)
Hemoglobin: 13.7 g/dL (ref 11.1–15.9)
MCH: 29 pg (ref 26.6–33.0)
MCHC: 33.3 g/dL (ref 31.5–35.7)
MCV: 87 fL (ref 79–97)
Platelets: 237 10*3/uL (ref 150–450)
RBC: 4.72 x10E6/uL (ref 3.77–5.28)
RDW: 11.9 % (ref 11.7–15.4)
WBC: 5.6 10*3/uL (ref 3.4–10.8)

## 2022-07-29 LAB — HEPATITIS C ANTIBODY: Hep C Virus Ab: NONREACTIVE

## 2022-07-29 LAB — HEMOGLOBIN A1C
Est. average glucose Bld gHb Est-mCnc: 103 mg/dL
Hgb A1c MFr Bld: 5.2 % (ref 4.8–5.6)

## 2022-07-29 LAB — MAGNESIUM: Magnesium: 1.9 mg/dL (ref 1.6–2.3)

## 2022-07-29 LAB — VITAMIN B12: Vitamin B-12: 763 pg/mL (ref 232–1245)

## 2022-07-29 LAB — VITAMIN D 25 HYDROXY (VIT D DEFICIENCY, FRACTURES): Vit D, 25-Hydroxy: 32.5 ng/mL (ref 30.0–100.0)

## 2023-07-28 NOTE — Patient Instructions (Incomplete)
 Preventive Care 80-36 Years Old, Female Preventive care refers to lifestyle choices and visits with your health care provider that can promote health and wellness. Preventive care visits are also called wellness exams. What can I expect for my preventive care visit? Counseling During your preventive care visit, your health care provider may ask about your: Medical history, including: Past medical problems. Family medical history. Pregnancy history. Current health, including: Menstrual cycle. Method of birth control. Emotional well-being. Home life and relationship well-being. Sexual activity and sexual health. Lifestyle, including: Alcohol, nicotine or tobacco, and drug use. Access to firearms. Diet, exercise, and sleep habits. Work and work Astronomer. Sunscreen use. Safety issues such as seatbelt and bike helmet use. Physical exam Your health care provider may check your: Height and weight. These may be used to calculate your BMI (body mass index). BMI is a measurement that tells if you are at a healthy weight. Waist circumference. This measures the distance around your waistline. This measurement also tells if you are at a healthy weight and may help predict your risk of certain diseases, such as type 2 diabetes and high blood pressure. Heart rate and blood pressure. Body temperature. Skin for abnormal spots. What immunizations do I need?  Vaccines are usually given at various ages, according to a schedule. Your health care provider will recommend vaccines for you based on your age, medical history, and lifestyle or other factors, such as travel or where you work. What tests do I need? Screening Your health care provider may recommend screening tests for certain conditions. This may include: Pelvic exam and Pap test. Lipid and cholesterol levels. Diabetes screening. This is done by checking your blood sugar (glucose) after you have not eaten for a while (fasting). Hepatitis  B test. Hepatitis C test. HIV (human immunodeficiency virus) test. STI (sexually transmitted infection) testing, if you are at risk. BRCA-related cancer screening. This may be done if you have a family history of breast, ovarian, tubal, or peritoneal cancers. Talk with your health care provider about your test results, treatment options, and if necessary, the need for more tests. Follow these instructions at home: Eating and drinking  Eat a healthy diet that includes fresh fruits and vegetables, whole grains, lean protein, and low-fat dairy products. Take vitamin and mineral supplements as recommended by your health care provider. Do not drink alcohol if: Your health care provider tells you not to drink. You are pregnant, may be pregnant, or are planning to become pregnant. If you drink alcohol: Limit how much you have to 0-1 drink a day. Know how much alcohol is in your drink. In the [place] one drink equals one 12 oz bottle of beer (355 mL), one 5 oz glass of wine (148 mL), or one 1 oz glass of hard liquor (44 mL). Lifestyle Brush your teeth every morning and night with fluoride toothpaste. Floss one time each day. Exercise for at least 30 minutes 5 or more days each week. Do not use any products that contain nicotine or tobacco. These products include cigarettes, chewing tobacco, and vaping devices, such as e-cigarettes. If you need help quitting, ask your health care provider. Do not use drugs. If you are sexually active, practice safe sex. Use a condom or other form of protection to prevent STIs. If you do not wish to become pregnant, use a form of birth control. If you plan to become pregnant, see your health care provider for a prepregnancy visit. Find healthy ways to manage stress, such as: Meditation,  yoga, or listening to music. Journaling. Talking to a trusted person. Spending time with friends and family. Minimize exposure to UV radiation to reduce your risk of skin  cancer. Safety Always wear your seat belt while driving or riding in a vehicle. Do not drive: If you have been drinking alcohol. Do not ride with someone who has been drinking. If you have been using any mind-altering substances or drugs. While texting. When you are tired or distracted. Wear a helmet and other protective equipment during sports activities. If you have firearms in your house, make sure you follow all gun safety procedures. Seek help if you have been physically or sexually abused. What's next? Go to your health care provider once a year for an annual wellness visit. Ask your health care provider how often you should have your eyes and teeth checked. Stay up to date on all vaccines. This information is not intended to replace advice given to you by your health care provider. Make sure you discuss any questions you have with your health care provider. Document Revised: 10/15/2020 Document Reviewed: 10/15/2020 Elsevier Patient Education  2024 [employer] Inc[employer]  Breast Self-Awareness Breast self-awareness is knowing how your breasts look and feel. You need to: Check your breasts on a regular basis. Tell your doctor about any changes. Become familiar with the look and feel of your breasts. This can help you catch a breast problem while it is still small and can be treated. You should do breast self-exams even if you have breast implants. What you need: A mirror. A well-lit room. A pillow or other soft object. How to do a breast self-exam Follow these steps to do a breast self-exam: Look for changes  Take off all the clothes above your waist. Stand in front of a mirror in a room with good lighting. Put your hands down at your sides. Compare your breasts in the mirror. Look for any difference between them, such as: A difference in shape. A difference in size. Wrinkles, dips, and bumps in one breast and not the other. Look at each breast for changes in the skin, such  as: Redness. Scaly areas. Skin that has gotten thicker. Dimpling. Open sores (ulcers). Look for changes in your nipples, such as: Fluid coming out of a nipple. Fluid around a nipple. Bleeding. Dimpling. Redness. A nipple that looks pushed in (retracted), or that has changed position. Feel for changes Lie on your back. Feel each breast. To do this: Pick a breast to feel. Place a pillow under the shoulder closest to that breast. Put the arm closest to that breast behind your head. Feel the nipple area of that breast using the hand of your other arm. Feel the area with the pads of your three middle fingers by making small circles with your fingers. Use light, medium, and firm pressure. Continue the overlapping circles, moving downward over the breast. Keep making circles with your fingers. Stop when you feel your ribs. Start making circles with your fingers again, this time going upward until you reach your collarbone. Then, make circles outward across your breast and into your armpit area. Squeeze your nipple. Check for discharge and lumps. Repeat these steps to check your other breast. Sit or stand in the tub or shower. With soapy water on your skin, feel each breast the same way you did when you were lying down. Write down what you find Writing down what you find can help you remember what to tell your doctor. Write down: What  is normal for each breast. Any changes you find in each breast. These include: The kind of changes you find. A tender or painful breast. Any lump you find. Write down its size and where it is. When you last had your monthly period (menstrual cycle). General tips If you are breastfeeding, the best time to check your breasts is after you feed your baby or after you use a breast pump. If you get monthly bleeding, the best time to check your breasts is 5-7 days after your monthly cycle ends. With time, you will become comfortable with the self-exam. You will  also start to know if there are changes in your breasts. Contact a doctor if: You see a change in the shape or size of your breasts or nipples. You see a change in the skin of your breast or nipples, such as red or scaly skin. You have fluid coming from your nipples that is not normal. You find a new lump or thick area. You have breast pain. You have any concerns about your breast health. Summary Breast self-awareness includes looking for changes in your breasts and feeling for changes within your breasts. You should do breast self-awareness in front of a mirror in a well-lit room. If you get monthly periods (menstrual cycles), the best time to check your breasts is 5-7 days after your period ends. Tell your doctor about any changes you see in your breasts. Changes include changes in size, changes on the skin, painful or tender breasts, or fluid from your nipples that is not normal. This information is not intended to replace advice given to you by your health care provider. Make sure you discuss any questions you have with your health care provider. Document Revised: 09/24/2021 Document Reviewed: 02/19/2021 Elsevier Patient Education  2024 [employer].

## 2023-07-28 NOTE — Progress Notes (Deleted)
 GYNECOLOGY ANNUAL PHYSICAL EXAM PROGRESS NOTE  Subjective:    Jillian Anderson is a 38 y.o. [phone] female who presents for an annual exam.  The patient {is/is not/has never been:13135} sexually active. The patient participates in regular exercise: {yes/no/not asked:9010}. Has the patient ever been transfused or tattooed?: {yes/no/not asked:9010}. The patient reports that there {is/is not:9024} domestic violence in her life.   The patient has the following complaints today:   Menstrual History: Menarche age: *** No LMP recorded. (Menstrual status: Irregular Periods).     Gynecologic History:  Contraception: {method:5051} History of STI's:  Last Pap: ***. Results were: {norm/abn:16337}.  ***Denies/Notes h/o abnormal pap smears. Last mammogram: ***. Results were: {norm/abn:16337}       OB History  Gravida Para Term Preterm AB Living  3 2 1 1 1 2   SAB IAB Ectopic Multiple Live Births  1 0 0 0 2    # Outcome Date GA Lbr Len/2nd Weight Sex Type Anes PTL Lv  3 Term 05/15/19 [redacted]w[redacted]d  6 lb 8.4 oz (2.96 kg) M Vag-Spont None N LIV     Complications: Hypertension, GDM (gestational diabetes mellitus)     Name: Anette Riedel      Apgar1: 8  Apgar5: 9  2 Preterm 11/05/17 [redacted]w[redacted]d / 00:07 4 lb 10.8 oz (2.12 kg) M Vag-Spont Local Y LIV     Complications: Preeclampsia, IUGR (intrauterine growth restriction) affecting care of mother     Name: [name]      Apgar1: 8  Apgar5: 9  1 SAB             Past Medical History:  Diagnosis Date   Allergy    Anemia    Asthma without status asthmaticus 03/15/2016   Astigmatism    COVID-19 04/2020   GERD (gastroesophageal reflux disease)    Gestational diabetes    History of kidney stones    In the past   IUGR (intrauterine growth restriction) affecting care of mother 11/03/2017   Migraines    Polycystic disease, ovaries    Polycystic ovarian syndrome 03/15/2016   Pre-eclampsia affecting pregnancy, antepartum 11/01/2017   Tachycardia    during  pregnancy    Past Surgical History:  Procedure Laterality Date   DILATION AND EVACUATION N/A 04/23/2016   Procedure: DILATATION AND EVACUATION;  Surgeon: Herold Harms, MD;  Location: [facility];  Service: Gynecology;  Laterality: N/A;   EXTRACORPOREAL SHOCK WAVE LITHOTRIPSY     IUD REMOVAL N/A 12/25/2020   Procedure: INTRAUTERINE DEVICE (IUD) REMOVAL;  Surgeon: Hildred Laser, MD;  Location: [facility];  Service: Gynecology;  Laterality: N/A;   LAPAROSCOPIC TUBAL LIGATION Bilateral 12/25/2020   Procedure: LAPAROSCOPIC TUBAL LIGATION;  Surgeon: Hildred Laser, MD;  Location: [facility];  Service: Gynecology;  Laterality: Bilateral;   WISDOM TOOTH EXTRACTION      Family History  Problem Relation Age of Onset   Hypertension Mother    Hyperlipidemia Father    Diabetes Father    Asthma Brother    Breast cancer Neg Hx    Ovarian cancer Neg Hx    Colon cancer Neg Hx     Social History   Socioeconomic History   Marital status: Married    Spouse name: Gerri Spore   Number of children: 2   Years of education: Not on file   Highest education level: Not on file  Occupational History   Not on file  Tobacco Use   Smoking status: Never   Smokeless tobacco: Never  Vaping  Use   Vaping status: Never Used  Substance and Sexual Activity   Alcohol use: Yes    Alcohol/week: 1.0 standard drink of alcohol    Types: 1 Glasses of wine per week    Comment: RARELY   Drug use: No   Sexual activity: Yes    Birth control/protection: I.U.D.    Comment: inserted 06/26/2019  Other Topics Concern   Not on file  Social History Narrative   Not on file   Social Drivers of Health   Financial Resource Strain: Not on file  Food Insecurity: Not on file  Transportation Needs: Not on file  Physical Activity: Not on file  Stress: Not on file  Social Connections: Not on file  Intimate Partner Violence: Not on file    Current Outpatient Medications on File Prior to Visit  Medication Sig Dispense Refill    acidophilus (RISAQUAD) CAPS capsule Take by mouth daily.     cetirizine (ZYRTEC) 10 MG tablet Take 10 mg by mouth daily.     Multiple Vitamin (MULTIVITAMIN WITH MINERALS) TABS tablet Take 1 tablet by mouth daily.     No current facility-administered medications on file prior to visit.    Allergies  Allergen Reactions   Ceclor [Cefaclor] Hives   Codeine     Body fights itself eyes dilate   Red Dye #40 (Allura Red) Hives and Nausea Only    Migraines   Blackberry Flavoring Agent (Non-Screening) Nausea And Vomiting and Rash   Raspberry Nausea And Vomiting and Rash     Review of Systems Constitutional: negative for chills, fatigue, fevers and sweats Eyes: negative for irritation, redness and visual disturbance Ears, nose, mouth, throat, and face: negative for hearing loss, nasal congestion, snoring and tinnitus Respiratory: negative for asthma, cough, sputum Cardiovascular: negative for chest pain, dyspnea, exertional chest pressure/discomfort, irregular heart beat, palpitations and syncope Gastrointestinal: negative for abdominal pain, change in bowel habits, nausea and vomiting Genitourinary: negative for abnormal menstrual periods, genital lesions, sexual problems and vaginal discharge, dysuria and urinary incontinence Integument/breast: negative for breast lump, breast tenderness and nipple discharge Hematologic/lymphatic: negative for bleeding and easy bruising Musculoskeletal:negative for back pain and muscle weakness Neurological: negative for dizziness, headaches, vertigo and weakness Endocrine: negative for diabetic symptoms including polydipsia, polyuria and skin dryness Allergic/Immunologic: negative for hay fever and urticaria      Objective:  currently breastfeeding. There is no height or weight on file to calculate BMI.    General Appearance:    Alert, cooperative, no distress, appears stated age  Head:    Normocephalic, without obvious abnormality, atraumatic   Eyes:    PERRL, conjunctiva/corneas clear, EOM's intact, both eyes  Ears:    Normal external ear canals, both ears  Nose:   Nares normal, septum midline, mucosa normal, no drainage or sinus tenderness  Throat:   Lips, mucosa, and tongue normal; teeth and gums normal  Neck:   Supple, symmetrical, trachea midline, no adenopathy; thyroid: no enlargement/tenderness/nodules; no carotid bruit or JVD  Back:     Symmetric, no curvature, ROM normal, no CVA tenderness  Lungs:     Clear to auscultation bilaterally, respirations unlabored  Chest Wall:    No tenderness or deformity   Heart:    Regular rate and rhythm, S1 and S2 normal, no murmur, rub or gallop  Breast Exam:    No tenderness, masses, or nipple abnormality  Abdomen:     Soft, non-tender, bowel sounds active all four quadrants, no masses, no organomegaly.  Genitalia:    Pelvic:external genitalia normal, vagina without lesions, discharge, or tenderness, rectovaginal septum  normal. Cervix normal in appearance, no cervical motion tenderness, no adnexal masses or tenderness.  Uterus normal size, shape, mobile, regular contours, nontender.  Rectal:    Normal external sphincter.  No hemorrhoids appreciated. Internal exam not done.   Extremities:   Extremities normal, atraumatic, no cyanosis or edema  Pulses:   2+ and symmetric all extremities  Skin:   Skin color, texture, turgor normal, no rashes or lesions  Lymph nodes:   Cervical, supraclavicular, and axillary nodes normal  Neurologic:   CNII-XII intact, normal strength, sensation and reflexes throughout   .  Labs:  Lab Results  Component Value Date   WBC 5.6 07/28/2022   HGB 13.7 07/28/2022   HCT 41.1 07/28/2022   MCV 87 07/28/2022   PLT 237 07/28/2022    Lab Results  Component Value Date   CREATININE 0.88 07/28/2022   BUN 14 07/28/2022   NA 138 07/28/2022   K 4.2 07/28/2022   CL 102 07/28/2022   CO2 25 07/28/2022    Lab Results  Component Value Date   ALT 16 07/28/2022    AST 16 07/28/2022   ALKPHOS 86 07/28/2022   BILITOT 0.4 07/28/2022    Lab Results  Component Value Date   TSH 1.280 07/28/2022     Assessment:   No diagnosis found.   Plan:  Blood tests: {blood tests:13147}. Breast self exam technique reviewed and patient encouraged to perform self-exam monthly. Contraception: {contraceptive methods:5051}. Discussed healthy lifestyle modifications. Mammogram {discussed/ordered:14545} Pap smear {discussed/ordered:14545}. Flu vaccine: Follow up in 1 year for annual exam   Tommie Raymond, CMA [facility]

## 2023-07-29 ENCOUNTER — Ambulatory Visit (INDEPENDENT_AMBULATORY_CARE_PROVIDER_SITE_OTHER): Payer: Self-pay | Admitting: Certified Nurse Midwife

## 2023-07-29 ENCOUNTER — Encounter: Payer: Self-pay | Admitting: Certified Nurse Midwife

## 2023-07-29 VITALS — BP 103/74 | HR 84 | Resp 16 | Ht 62.0 in | Wt 183.6 lb

## 2023-07-29 DIAGNOSIS — E66811 Obesity, class 1: Secondary | ICD-10-CM

## 2023-07-29 DIAGNOSIS — E785 Hyperlipidemia, unspecified: Secondary | ICD-10-CM

## 2023-07-29 DIAGNOSIS — Z01419 Encounter for gynecological examination (general) (routine) without abnormal findings: Secondary | ICD-10-CM

## 2023-07-29 DIAGNOSIS — R5383 Other fatigue: Secondary | ICD-10-CM | POA: Insufficient documentation

## 2023-07-29 DIAGNOSIS — Z131 Encounter for screening for diabetes mellitus: Secondary | ICD-10-CM

## 2023-07-29 DIAGNOSIS — E559 Vitamin D deficiency, unspecified: Secondary | ICD-10-CM

## 2023-07-29 DIAGNOSIS — Z0000 Encounter for general adult medical examination without abnormal findings: Secondary | ICD-10-CM | POA: Insufficient documentation

## 2023-07-29 NOTE — Assessment & Plan Note (Signed)
-  Will check TSH, CBC with ferritin and vitamin D levels -Reviewed sleep hygiene, diet recall and hydration status.  -Negative for depression screen.  -Reviewed need for daily stress reduction moments if possible. -Will add some phytoestrogen type foods to look for improvement.

## 2023-07-29 NOTE — Progress Notes (Signed)
 Outpatient Gynecology Note: Annual Visit  Assessment/Plan:    Jillian Anderson is a 38 y.o. female [phone] with normal well-woman gynecologic exam.   Fatigue -Will check TSH, CBC with ferritin and vitamin D levels -Reviewed sleep hygiene, diet recall and hydration status.  -Negative for depression screen.  -Reviewed need for daily stress reduction moments if possible. -Will add some phytoestrogen type foods to look for improvement.   Well woman exam (no gynecological exam) - Reviewed health maintenance topics as documented below. - Most recent pap smear in 05/2021, repeat cervical cancer screening due 2028 . - Satisfied with current contraception - BTL -Breast exam unremarkable      Risk factors identified in Subjective to review:  Orders Placed This Encounter  Procedures   CBC   Comprehensive metabolic panel with GFR   Hemoglobin A1c   TSH   VITAMIN D 25 Hydroxy (Vit-D Deficiency, Fractures)   Ferritin   T4, free   Current Outpatient Medications  Medication Instructions   acidophilus (RISAQUAD) CAPS capsule Daily   cetirizine (ZYRTEC) 10 mg, Daily   Multiple Vitamin (MULTIVITAMIN WITH MINERALS) TABS tablet 1 tablet, Daily    No follow-ups on file.    Subjective:    Jillian Anderson is a 38 y.o. female [phone] who presents for annual wellness visit.   Occupation works from home   Lives with husband and children    CONCERNS? Has noticed a increase in fatigue symptoms despite weight training, attentive diet and good sleep.   Well Woman Visit:  GYN HISTORY:  Patient's last menstrual period was 07/06/2023 (exact date).     Menstrual History: OB History     Gravida  3   Para  2   Term  1   Preterm  1   AB  1   Living  2      SAB  1   IAB      Ectopic      Multiple  0   Live Births  2           Patient's last menstrual period was 07/06/2023 (exact date).     Periods are regular and last 3-5 days. Dysmenorrhea: none Cyclic  symptoms include:  fatigue .  Intermenstrual bleeding, spotting, or discharge? No  Sexually active: Yes Dyspareunia? No Last pap:was normal  History of abnormal Pap: No STI/HIV testing or immunizations needed? No.  Contraceptive methods: bilateral tubal ligation  Health Maintenance > Reviewed breast self-awareness > History of abnormal mammogram: No > Exercise: aerobics and weightlifting, very active > Body mass index is 33.58 kg/m.  > Safe in current relationship? Yes.   > Concern for alcohol abuse? No  Tobacco Use: Low Risk  (07/29/2023)   Patient History    Smoking Tobacco Use: Never    Smokeless Tobacco Use: Never    Passive Exposure: Not on file      _________________________________________________________  Current Outpatient Medications  Medication Sig Dispense Refill   acidophilus (RISAQUAD) CAPS capsule Take by mouth daily.     cetirizine (ZYRTEC) 10 MG tablet Take 10 mg by mouth daily.     Multiple Vitamin (MULTIVITAMIN WITH MINERALS) TABS tablet Take 1 tablet by mouth daily.     No current facility-administered medications for this visit.   Allergies  Allergen Reactions   Ceclor [Cefaclor] Hives   Codeine     Body fights itself eyes dilate   Red Dye #40 (Allura Red) Hives and Nausea Only    Migraines  Blackberry Flavoring Agent (Non-Screening) Nausea And Vomiting and Rash   Raspberry Nausea And Vomiting and Rash    Past Medical History:  Diagnosis Date   Allergy    Anemia    Asthma without status asthmaticus 03/15/2016   Astigmatism    COVID-19 04/2020   GERD (gastroesophageal reflux disease)    Gestational diabetes    History of kidney stones    In the past   IUGR (intrauterine growth restriction) affecting care of mother 11/03/2017   Migraines    Polycystic disease, ovaries    Polycystic ovarian syndrome 03/15/2016   Pre-eclampsia affecting pregnancy, antepartum 11/01/2017   Tachycardia    during pregnancy   Past Surgical History:   Procedure Laterality Date   DILATION AND EVACUATION N/A 04/23/2016   Procedure: DILATATION AND EVACUATION;  Surgeon: Herold Harms, MD;  Location: [facility];  Service: Gynecology;  Laterality: N/A;   EXTRACORPOREAL SHOCK WAVE LITHOTRIPSY     IUD REMOVAL N/A 12/25/2020   Procedure: INTRAUTERINE DEVICE (IUD) REMOVAL;  Surgeon: Hildred Laser, MD;  Location: [facility];  Service: Gynecology;  Laterality: N/A;   LAPAROSCOPIC TUBAL LIGATION Bilateral 12/25/2020   Procedure: LAPAROSCOPIC TUBAL LIGATION;  Surgeon: Hildred Laser, MD;  Location: [facility];  Service: Gynecology;  Laterality: Bilateral;   WISDOM TOOTH EXTRACTION     OB History     Gravida  3   Para  2   Term  1   Preterm  1   AB  1   Living  2      SAB  1   IAB      Ectopic      Multiple  0   Live Births  2          Social History   Tobacco Use   Smoking status: Never   Smokeless tobacco: Never  Substance Use Topics   Alcohol use: Yes    Alcohol/week: 1.0 standard drink of alcohol    Types: 1 Glasses of wine per week    Comment: RARELY   Social History   Substance and Sexual Activity  Sexual Activity Yes   Birth control/protection: I.U.D.   Comment: inserted 06/26/2019    Immunization History  Administered Date(s) Administered   DTaP 07/01/1988, 09/03/1988, 11/19/1988, 08/30/1989, 07/15/1993   HIB (PRP-OMP) 08/30/1992   Hepatitis A 02/08/2005, 11/03/2006   Hepatitis B 02/12/2000, 03/18/2000, 09/09/2000   IPV 07/01/1988, 09/03/1988, 08/30/1989, 07/15/1993   Influenza Split 02/09/2012   Influenza,inj,Quad PF,6+ Mos 04/25/2014, 06/04/2016, 06/24/2017, 03/06/2019   MMR 08/30/1989, 07/15/1993   Td 02/08/2005   Tdap 08/22/2013, 09/14/2017, 02/21/2019     Review Of Systems  Constitutional: Denied constitutional symptoms, night sweats, recent illness, fatigue, fever, insomnia and weight loss.  Eyes: Denied eye symptoms, eye pain, photophobia, vision change and visual disturbance.   Ears/Nose/Throat/Neck: Denied ear, nose, throat or neck symptoms, hearing loss, nasal discharge, sinus congestion and sore throat.  Cardiovascular: Denied cardiovascular symptoms, arrhythmia, chest pain/pressure, edema, exercise intolerance, orthopnea and palpitations.  Respiratory: Denied pulmonary symptoms, asthma, pleuritic pain, productive sputum, cough, dyspnea and wheezing.  Gastrointestinal: Denied, gastro-esophageal reflux, melena, nausea and vomiting.  Genitourinary: Denied genitourinary symptoms including symptomatic vaginal discharge, pelvic relaxation issues, and urinary complaints.  Musculoskeletal: Denied musculoskeletal symptoms, stiffness, swelling, muscle weakness and myalgia.  Dermatologic: Denied dermatology symptoms, rash and scar.  Neurologic: Denied neurology symptoms, dizziness, headache, neck pain and syncope.  Psychiatric: Denied psychiatric symptoms, anxiety and depression.  Endocrine: Denied endocrine symptoms including hot flashes and night sweats.  Objective:    BP 103/74   Pulse 84   Resp 16   Ht 5\' 2"  (1.575 m)   Wt 183 lb 9.6 oz (83.3 kg)   LMP 07/06/2023 (Exact Date)   Breastfeeding No   BMI 33.58 kg/m   Constitutional: Well-developed, well-nourished female in no acute distress Neurological: Alert and oriented to person, place, and time Psychiatric: Mood and affect appropriate Skin: No rashes or lesions Neck: Supple without masses. Trachea is midline.Thyroid is normal size without masses Respiratory:  normal work of breathing. Cardiovascular:  Extremities grossly normal, nontender with no edema; pulses regular Gastrointestinal: Soft, nontender, nondistended. No masses or hernias appreciated. No hepatosplenomegaly. No fluid wave. No rebound or guarding. Breast Exam: normal appearance, no masses or tenderness Genitourinary: deferred Adnexae: Non-palpable and non-tender Perineum/Anus: No lesions Rectal: deferred    Oley Balm,  CNM 07/29/23 10:15 AM

## 2023-07-29 NOTE — Assessment & Plan Note (Signed)
-   Reviewed health maintenance topics as documented below. - Most recent pap smear in 05/2021, repeat cervical cancer screening due 2028 . - Satisfied with current contraception - BTL -Breast exam unremarkable

## 2023-07-30 LAB — COMPREHENSIVE METABOLIC PANEL WITH GFR
ALT: 26 IU/L (ref 0–32)
AST: 16 IU/L (ref 0–40)
Albumin: 4.1 g/dL (ref 3.9–4.9)
Alkaline Phosphatase: 85 IU/L (ref 44–121)
BUN/Creatinine Ratio: 15 (ref 9–23)
BUN: 12 mg/dL (ref 6–20)
Bilirubin Total: 0.4 mg/dL (ref 0.0–1.2)
CO2: 21 mmol/L (ref 20–29)
Calcium: 9.1 mg/dL (ref 8.7–10.2)
Chloride: 102 mmol/L (ref 96–106)
Creatinine, Ser: 0.81 mg/dL (ref 0.57–1.00)
Globulin, Total: 3 g/dL (ref 1.5–4.5)
Glucose: 89 mg/dL (ref 70–99)
Potassium: 4.6 mmol/L (ref 3.5–5.2)
Sodium: 138 mmol/L (ref 134–144)
Total Protein: 7.1 g/dL (ref 6.0–8.5)
eGFR: 97 mL/min/{1.73_m2} (ref >59)

## 2023-07-30 LAB — CBC
Hematocrit: 42.5 % (ref 34.0–46.6)
Hemoglobin: 13.8 g/dL (ref 11.1–15.9)
MCH: 29.6 pg (ref 26.6–33.0)
MCHC: 32.5 g/dL (ref 31.5–35.7)
MCV: 91 fL (ref 79–97)
Platelets: 225 10*3/uL (ref 150–450)
RBC: 4.66 x10E6/uL (ref 3.77–5.28)
RDW: 12.4 % (ref 11.7–15.4)
WBC: 5.6 10*3/uL (ref 3.4–10.8)

## 2023-07-30 LAB — TSH: TSH: 1.07 u[IU]/mL (ref 0.450–4.500)

## 2023-07-30 LAB — VITAMIN D 25 HYDROXY (VIT D DEFICIENCY, FRACTURES): Vit D, 25-Hydroxy: 23.8 ng/mL — ABNORMAL LOW (ref 30.0–100.0)

## 2023-07-30 LAB — FERRITIN: Ferritin: 105 ng/mL (ref 15–150)

## 2023-07-30 LAB — HEMOGLOBIN A1C
Est. average glucose Bld gHb Est-mCnc: 105 mg/dL
Hgb A1c MFr Bld: 5.3 % (ref 4.8–5.6)

## 2023-07-30 LAB — T4, FREE: Free T4: 0.97 ng/dL (ref 0.82–1.77)

## 2023-08-10 ENCOUNTER — Encounter: Payer: Self-pay | Admitting: Obstetrics and Gynecology

## 2024-07-31 ENCOUNTER — Ambulatory Visit: Admitting: Certified Nurse Midwife
